# Patient Record
Sex: Male | Born: 1935 | Race: White | Hispanic: No | Marital: Married | Smoking: Former smoker
Health system: Southern US, Community
[De-identification: ages and names within clinical notes are randomized; demographics above are authoritative.]

## PROBLEM LIST (undated history)

## (undated) DIAGNOSIS — K219 Gastro-esophageal reflux disease without esophagitis: Secondary | ICD-10-CM

## (undated) DIAGNOSIS — E039 Hypothyroidism, unspecified: Secondary | ICD-10-CM

## (undated) DIAGNOSIS — IMO0002 Reserved for concepts with insufficient information to code with codable children: Secondary | ICD-10-CM

## (undated) DIAGNOSIS — I4891 Unspecified atrial fibrillation: Secondary | ICD-10-CM

## (undated) DIAGNOSIS — I509 Heart failure, unspecified: Secondary | ICD-10-CM

## (undated) DIAGNOSIS — I4892 Unspecified atrial flutter: Secondary | ICD-10-CM

## (undated) DIAGNOSIS — I495 Sick sinus syndrome: Secondary | ICD-10-CM

## (undated) DIAGNOSIS — M109 Gout, unspecified: Secondary | ICD-10-CM

## (undated) DIAGNOSIS — J449 Chronic obstructive pulmonary disease, unspecified: Secondary | ICD-10-CM

## (undated) DIAGNOSIS — I272 Pulmonary hypertension, unspecified: Secondary | ICD-10-CM

## (undated) DIAGNOSIS — H409 Unspecified glaucoma: Secondary | ICD-10-CM

## (undated) DIAGNOSIS — Z950 Presence of cardiac pacemaker: Secondary | ICD-10-CM

## (undated) HISTORY — DX: Gout, unspecified: M10.9

## (undated) HISTORY — PX: COLONOSCOPY W/ POLYPECTOMY: SHX1380

## (undated) HISTORY — DX: Heart failure, unspecified: I50.9

## (undated) HISTORY — DX: Reserved for concepts with insufficient information to code with codable children: IMO0002

## (undated) HISTORY — DX: Sick sinus syndrome: I49.5

## (undated) HISTORY — DX: Hypothyroidism, unspecified: E03.9

## (undated) HISTORY — DX: Gastro-esophageal reflux disease without esophagitis: K21.9

## (undated) HISTORY — DX: Chronic obstructive pulmonary disease, unspecified: J44.9

## (undated) HISTORY — DX: Unspecified atrial flutter: I48.92

## (undated) HISTORY — DX: Pulmonary hypertension, unspecified: I27.20

## (undated) HISTORY — DX: Unspecified glaucoma: H40.9

## (undated) HISTORY — PX: LUNG BIOPSY: SHX232

## (undated) HISTORY — DX: Presence of cardiac pacemaker: Z95.0

## (undated) HISTORY — DX: Unspecified atrial fibrillation: I48.91

---

## 2001-04-03 ENCOUNTER — Ambulatory Visit (HOSPITAL_COMMUNITY): Admission: RE | Admit: 2001-04-03 | Discharge: 2001-04-03 | Payer: Self-pay | Admitting: *Deleted

## 2002-04-07 ENCOUNTER — Encounter: Payer: Self-pay | Admitting: Family Medicine

## 2002-04-07 ENCOUNTER — Ambulatory Visit (HOSPITAL_COMMUNITY): Admission: RE | Admit: 2002-04-07 | Discharge: 2002-04-07 | Payer: Self-pay | Admitting: Family Medicine

## 2002-05-14 ENCOUNTER — Encounter (INDEPENDENT_AMBULATORY_CARE_PROVIDER_SITE_OTHER): Payer: Self-pay | Admitting: Internal Medicine

## 2002-05-14 ENCOUNTER — Ambulatory Visit (HOSPITAL_COMMUNITY): Admission: RE | Admit: 2002-05-14 | Discharge: 2002-05-14 | Payer: Self-pay | Admitting: Internal Medicine

## 2002-06-09 ENCOUNTER — Ambulatory Visit (HOSPITAL_COMMUNITY): Admission: RE | Admit: 2002-06-09 | Discharge: 2002-06-09 | Payer: Self-pay | Admitting: Otolaryngology

## 2002-06-09 ENCOUNTER — Encounter: Payer: Self-pay | Admitting: Otolaryngology

## 2002-09-23 ENCOUNTER — Ambulatory Visit (HOSPITAL_COMMUNITY): Admission: RE | Admit: 2002-09-23 | Discharge: 2002-09-23 | Payer: Self-pay | Admitting: Internal Medicine

## 2002-09-23 ENCOUNTER — Encounter (INDEPENDENT_AMBULATORY_CARE_PROVIDER_SITE_OTHER): Payer: Self-pay | Admitting: Internal Medicine

## 2002-10-04 ENCOUNTER — Ambulatory Visit (HOSPITAL_COMMUNITY): Admission: RE | Admit: 2002-10-04 | Discharge: 2002-10-04 | Payer: Self-pay | Admitting: Internal Medicine

## 2002-10-07 ENCOUNTER — Encounter: Payer: Self-pay | Admitting: Internal Medicine

## 2002-10-07 ENCOUNTER — Emergency Department (HOSPITAL_COMMUNITY): Admission: EM | Admit: 2002-10-07 | Discharge: 2002-10-07 | Payer: Self-pay | Admitting: Internal Medicine

## 2003-04-29 ENCOUNTER — Encounter: Payer: Self-pay | Admitting: Family Medicine

## 2003-04-29 ENCOUNTER — Ambulatory Visit (HOSPITAL_COMMUNITY): Admission: RE | Admit: 2003-04-29 | Discharge: 2003-04-29 | Payer: Self-pay | Admitting: Family Medicine

## 2003-05-05 ENCOUNTER — Ambulatory Visit (HOSPITAL_COMMUNITY): Admission: RE | Admit: 2003-05-05 | Discharge: 2003-05-05 | Payer: Self-pay | Admitting: Family Medicine

## 2003-07-19 ENCOUNTER — Ambulatory Visit (HOSPITAL_COMMUNITY): Admission: RE | Admit: 2003-07-19 | Discharge: 2003-07-19 | Payer: Self-pay | Admitting: Thoracic Surgery

## 2003-09-02 ENCOUNTER — Ambulatory Visit (HOSPITAL_COMMUNITY): Admission: RE | Admit: 2003-09-02 | Discharge: 2003-09-02 | Payer: Self-pay | Admitting: Family Medicine

## 2003-09-02 ENCOUNTER — Encounter: Payer: Self-pay | Admitting: Family Medicine

## 2003-09-13 ENCOUNTER — Ambulatory Visit (HOSPITAL_COMMUNITY): Admission: RE | Admit: 2003-09-13 | Discharge: 2003-09-13 | Payer: Self-pay | Admitting: Family Medicine

## 2003-09-13 ENCOUNTER — Encounter: Payer: Self-pay | Admitting: Family Medicine

## 2004-01-25 ENCOUNTER — Ambulatory Visit (HOSPITAL_COMMUNITY): Admission: RE | Admit: 2004-01-25 | Discharge: 2004-01-25 | Payer: Self-pay | Admitting: Pulmonary Disease

## 2004-05-18 ENCOUNTER — Ambulatory Visit (HOSPITAL_COMMUNITY): Admission: RE | Admit: 2004-05-18 | Discharge: 2004-05-18 | Payer: Self-pay | Admitting: Family Medicine

## 2004-10-08 ENCOUNTER — Ambulatory Visit (HOSPITAL_COMMUNITY): Admission: RE | Admit: 2004-10-08 | Discharge: 2004-10-08 | Payer: Self-pay | Admitting: Family Medicine

## 2004-10-22 ENCOUNTER — Ambulatory Visit: Payer: Self-pay | Admitting: Internal Medicine

## 2004-12-03 ENCOUNTER — Ambulatory Visit (HOSPITAL_COMMUNITY): Admission: RE | Admit: 2004-12-03 | Discharge: 2004-12-03 | Payer: Self-pay | Admitting: Family Medicine

## 2005-05-20 ENCOUNTER — Emergency Department (HOSPITAL_COMMUNITY): Admission: EM | Admit: 2005-05-20 | Discharge: 2005-05-20 | Payer: Self-pay | Admitting: Emergency Medicine

## 2005-06-05 ENCOUNTER — Emergency Department (HOSPITAL_COMMUNITY): Admission: EM | Admit: 2005-06-05 | Discharge: 2005-06-05 | Payer: Self-pay | Admitting: Emergency Medicine

## 2005-08-02 ENCOUNTER — Ambulatory Visit: Payer: Self-pay | Admitting: Internal Medicine

## 2005-11-18 ENCOUNTER — Ambulatory Visit (HOSPITAL_COMMUNITY): Admission: RE | Admit: 2005-11-18 | Discharge: 2005-11-18 | Payer: Self-pay | Admitting: Family Medicine

## 2005-11-28 ENCOUNTER — Ambulatory Visit (HOSPITAL_COMMUNITY): Admission: RE | Admit: 2005-11-28 | Discharge: 2005-11-28 | Payer: Self-pay | Admitting: Family Medicine

## 2006-08-28 ENCOUNTER — Encounter: Admission: RE | Admit: 2006-08-28 | Discharge: 2006-08-28 | Payer: Self-pay | Admitting: Rheumatology

## 2006-11-25 ENCOUNTER — Encounter: Admission: RE | Admit: 2006-11-25 | Discharge: 2006-11-25 | Payer: Self-pay | Admitting: Rheumatology

## 2007-01-11 ENCOUNTER — Inpatient Hospital Stay (HOSPITAL_COMMUNITY): Admission: EM | Admit: 2007-01-11 | Discharge: 2007-01-13 | Payer: Self-pay | Admitting: Emergency Medicine

## 2007-03-19 ENCOUNTER — Ambulatory Visit: Payer: Self-pay | Admitting: Internal Medicine

## 2007-05-07 ENCOUNTER — Ambulatory Visit: Payer: Self-pay | Admitting: Internal Medicine

## 2007-06-15 ENCOUNTER — Ambulatory Visit: Payer: Self-pay | Admitting: Internal Medicine

## 2007-07-13 ENCOUNTER — Encounter: Admission: RE | Admit: 2007-07-13 | Discharge: 2007-07-13 | Payer: Self-pay | Admitting: Rheumatology

## 2007-08-08 ENCOUNTER — Emergency Department (HOSPITAL_COMMUNITY): Admission: EM | Admit: 2007-08-08 | Discharge: 2007-08-08 | Payer: Self-pay | Admitting: Emergency Medicine

## 2007-08-12 ENCOUNTER — Encounter: Admission: RE | Admit: 2007-08-12 | Discharge: 2007-08-12 | Payer: Self-pay | Admitting: *Deleted

## 2007-08-27 ENCOUNTER — Ambulatory Visit: Payer: Self-pay | Admitting: Internal Medicine

## 2008-04-11 ENCOUNTER — Ambulatory Visit (HOSPITAL_COMMUNITY): Admission: RE | Admit: 2008-04-11 | Discharge: 2008-04-11 | Payer: Self-pay | Admitting: Family Medicine

## 2008-05-26 ENCOUNTER — Ambulatory Visit: Payer: Self-pay | Admitting: Cardiovascular Disease

## 2008-05-31 ENCOUNTER — Ambulatory Visit (HOSPITAL_COMMUNITY): Admission: RE | Admit: 2008-05-31 | Discharge: 2008-05-31 | Payer: Self-pay | Admitting: Cardiovascular Disease

## 2008-05-31 ENCOUNTER — Ambulatory Visit: Payer: Self-pay | Admitting: Cardiology

## 2008-05-31 ENCOUNTER — Encounter: Payer: Self-pay | Admitting: Cardiovascular Disease

## 2008-06-11 ENCOUNTER — Encounter: Payer: Self-pay | Admitting: Internal Medicine

## 2008-06-11 DIAGNOSIS — M1990 Unspecified osteoarthritis, unspecified site: Secondary | ICD-10-CM | POA: Insufficient documentation

## 2008-06-11 DIAGNOSIS — J449 Chronic obstructive pulmonary disease, unspecified: Secondary | ICD-10-CM

## 2008-06-11 DIAGNOSIS — I517 Cardiomegaly: Secondary | ICD-10-CM

## 2008-06-11 DIAGNOSIS — Z87448 Personal history of other diseases of urinary system: Secondary | ICD-10-CM | POA: Insufficient documentation

## 2008-06-15 ENCOUNTER — Encounter: Payer: Self-pay | Admitting: Internal Medicine

## 2009-02-22 ENCOUNTER — Ambulatory Visit (HOSPITAL_COMMUNITY): Admission: RE | Admit: 2009-02-22 | Discharge: 2009-02-22 | Payer: Self-pay | Admitting: Family Medicine

## 2009-02-28 ENCOUNTER — Ambulatory Visit (HOSPITAL_COMMUNITY): Admission: RE | Admit: 2009-02-28 | Discharge: 2009-02-28 | Payer: Self-pay | Admitting: Family Medicine

## 2009-03-20 ENCOUNTER — Ambulatory Visit: Payer: Self-pay | Admitting: Internal Medicine

## 2009-06-14 ENCOUNTER — Encounter: Payer: Self-pay | Admitting: Internal Medicine

## 2009-06-30 ENCOUNTER — Ambulatory Visit (HOSPITAL_COMMUNITY): Admission: RE | Admit: 2009-06-30 | Discharge: 2009-06-30 | Payer: Self-pay | Admitting: Family Medicine

## 2009-07-06 ENCOUNTER — Ambulatory Visit (HOSPITAL_COMMUNITY): Admission: RE | Admit: 2009-07-06 | Discharge: 2009-07-06 | Payer: Self-pay | Admitting: Rheumatology

## 2010-04-29 ENCOUNTER — Inpatient Hospital Stay (HOSPITAL_COMMUNITY): Admission: EM | Admit: 2010-04-29 | Discharge: 2010-05-02 | Payer: Self-pay | Admitting: Emergency Medicine

## 2010-04-30 ENCOUNTER — Encounter (INDEPENDENT_AMBULATORY_CARE_PROVIDER_SITE_OTHER): Payer: Self-pay | Admitting: Internal Medicine

## 2010-05-18 ENCOUNTER — Ambulatory Visit (HOSPITAL_COMMUNITY): Admission: RE | Admit: 2010-05-18 | Discharge: 2010-05-18 | Payer: Self-pay | Admitting: Cardiology

## 2010-05-22 ENCOUNTER — Ambulatory Visit (HOSPITAL_COMMUNITY): Admission: RE | Admit: 2010-05-22 | Discharge: 2010-05-22 | Payer: Self-pay | Admitting: Cardiology

## 2010-06-03 ENCOUNTER — Inpatient Hospital Stay (HOSPITAL_COMMUNITY): Admission: EM | Admit: 2010-06-03 | Discharge: 2010-06-07 | Payer: Self-pay | Admitting: Emergency Medicine

## 2010-06-04 ENCOUNTER — Ambulatory Visit: Payer: Self-pay | Admitting: Gastroenterology

## 2010-06-05 ENCOUNTER — Encounter (INDEPENDENT_AMBULATORY_CARE_PROVIDER_SITE_OTHER): Payer: Self-pay

## 2010-06-05 ENCOUNTER — Ambulatory Visit: Payer: Self-pay | Admitting: Gastroenterology

## 2010-06-06 ENCOUNTER — Ambulatory Visit: Payer: Self-pay | Admitting: Gastroenterology

## 2010-06-19 ENCOUNTER — Ambulatory Visit (HOSPITAL_COMMUNITY): Admission: RE | Admit: 2010-06-19 | Discharge: 2010-06-19 | Payer: Self-pay | Admitting: Cardiology

## 2010-06-26 ENCOUNTER — Encounter (INDEPENDENT_AMBULATORY_CARE_PROVIDER_SITE_OTHER): Payer: Self-pay | Admitting: Cardiology

## 2010-06-26 ENCOUNTER — Inpatient Hospital Stay (HOSPITAL_COMMUNITY): Admission: RE | Admit: 2010-06-26 | Discharge: 2010-06-27 | Payer: Self-pay | Admitting: Cardiology

## 2010-08-02 ENCOUNTER — Encounter: Payer: Self-pay | Admitting: Gastroenterology

## 2010-09-20 ENCOUNTER — Ambulatory Visit (HOSPITAL_COMMUNITY): Admission: RE | Admit: 2010-09-20 | Discharge: 2010-09-21 | Payer: Self-pay | Admitting: Cardiology

## 2010-09-20 DIAGNOSIS — Z950 Presence of cardiac pacemaker: Secondary | ICD-10-CM

## 2010-09-20 HISTORY — PX: INSERT / REPLACE / REMOVE PACEMAKER: SUR710

## 2010-09-20 HISTORY — DX: Presence of cardiac pacemaker: Z95.0

## 2010-09-23 IMAGING — CR DG CHEST 1V PORT
1 series · 1 of 1 positions shown · non-contrast
Comparison: Portable exam 0171 hours compared to 05/18/2010

CLINICAL DATA: Lower GI bleed

PORTABLE CHEST - 1 VIEW

[view not recorded]
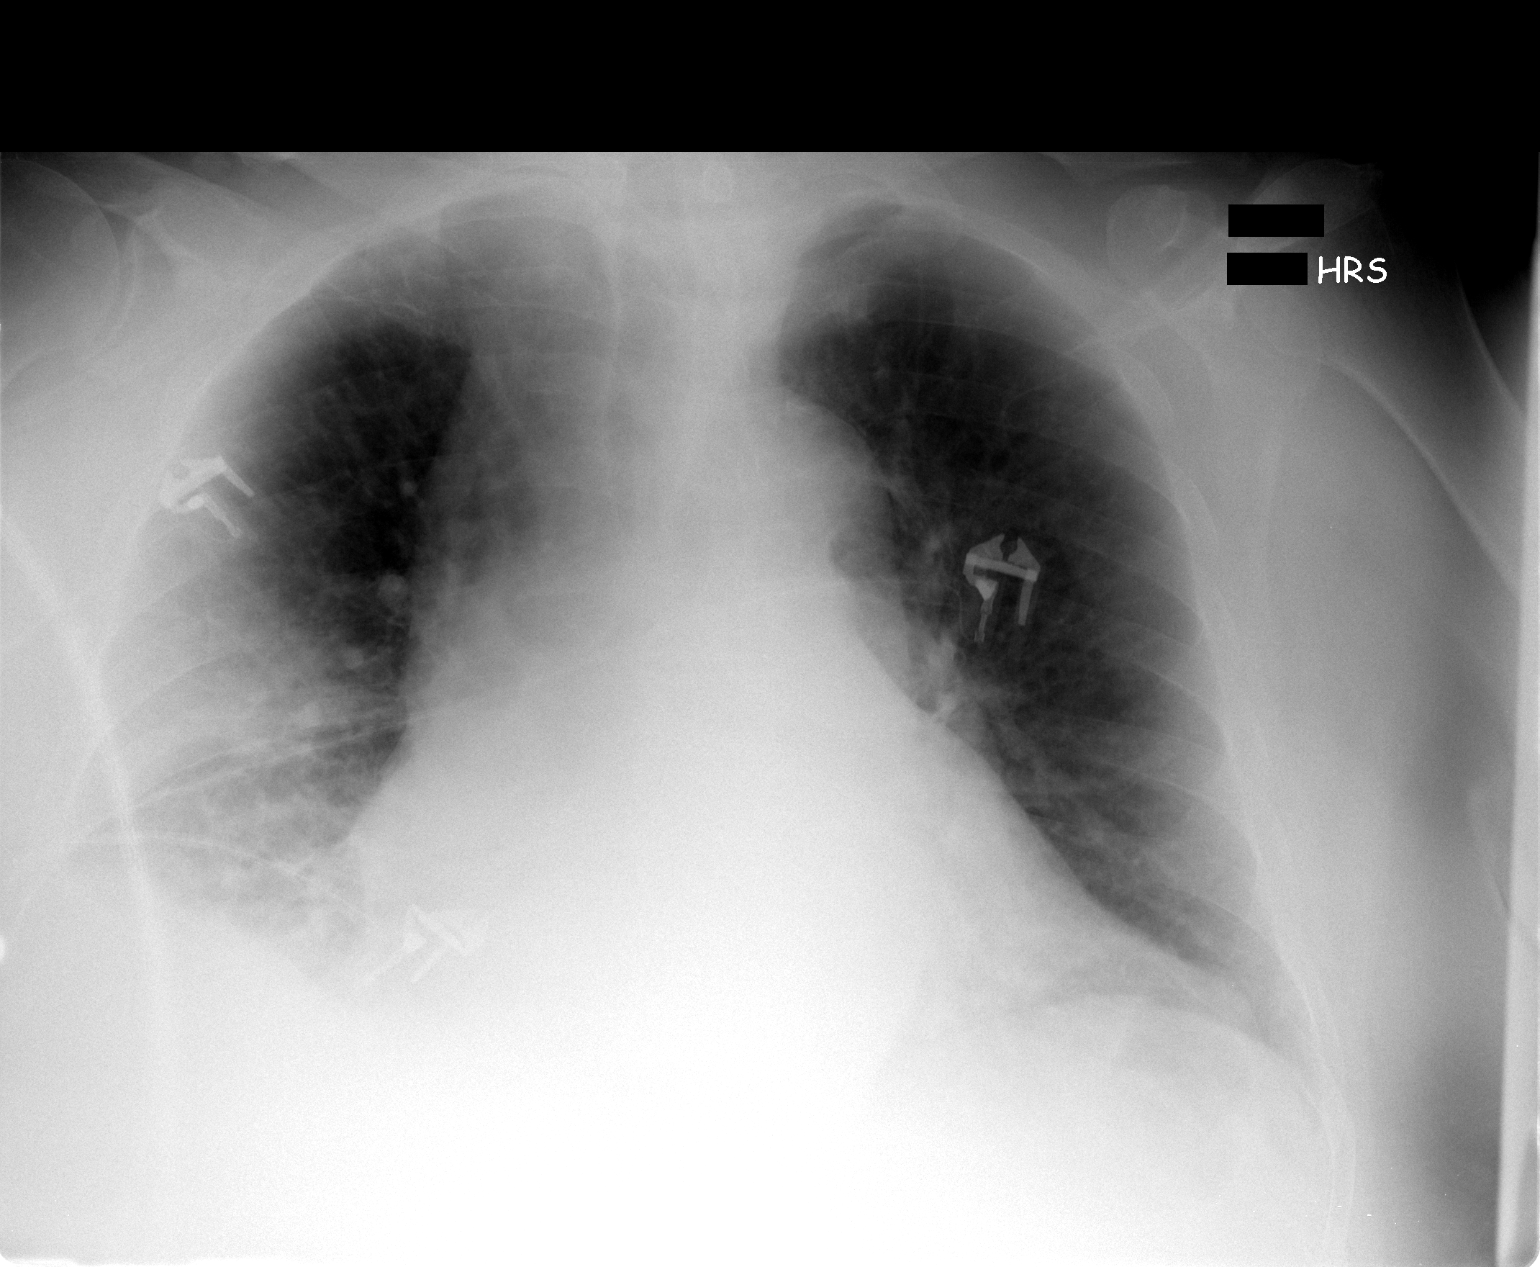

[1 of 1 positions shown; findings below may reference images not displayed]

FINDINGS: Cardiac enlargement.
Tortuous thoracic aorta.
Pulmonary vascularity normal.
Infiltrate identified in right lower lobe and questionably in right
middle lobe, with poor definition of a portion of the right heart
border.
Left lung clear.
Lordotic positioning.
No gross effusion or pneumothorax.
IMPRESSION: Right lower lobe and question mild right middle lobe infiltrates.
Cardiomegaly.

## 2010-09-24 ENCOUNTER — Ambulatory Visit (HOSPITAL_COMMUNITY): Admission: RE | Admit: 2010-09-24 | Discharge: 2010-09-24 | Payer: Self-pay | Admitting: Cardiology

## 2010-09-27 ENCOUNTER — Ambulatory Visit (HOSPITAL_COMMUNITY): Admission: RE | Admit: 2010-09-27 | Discharge: 2010-09-27 | Payer: Self-pay | Admitting: Cardiology

## 2010-10-18 ENCOUNTER — Inpatient Hospital Stay (HOSPITAL_COMMUNITY): Admission: EM | Admit: 2010-10-18 | Discharge: 2010-10-19 | Payer: Self-pay | Admitting: Emergency Medicine

## 2010-10-22 ENCOUNTER — Ambulatory Visit (HOSPITAL_COMMUNITY): Admission: RE | Admit: 2010-10-22 | Discharge: 2010-10-22 | Payer: Self-pay | Admitting: Cardiology

## 2011-01-01 ENCOUNTER — Ambulatory Visit
Admission: RE | Admit: 2011-01-01 | Discharge: 2011-01-01 | Payer: Self-pay | Source: Home / Self Care | Attending: Internal Medicine | Admitting: Internal Medicine

## 2011-01-15 NOTE — Letter (Signed)
Summary: CONSULTATION  CONSULTATION   Imported By: Hoy Morn 08/02/2010 15:54:33  _____________________________________________________________________  External Attachment:    Type:   Image     Comment:   External Document

## 2011-02-09 IMAGING — CR DG CHEST 2V
2 series · 2 of 2 positions shown · non-contrast
Comparison: 10/18/2010.

CLINICAL DATA: Pneumothorax follow up.

CHEST - 2 VIEW

[view not recorded (1 of 2)]
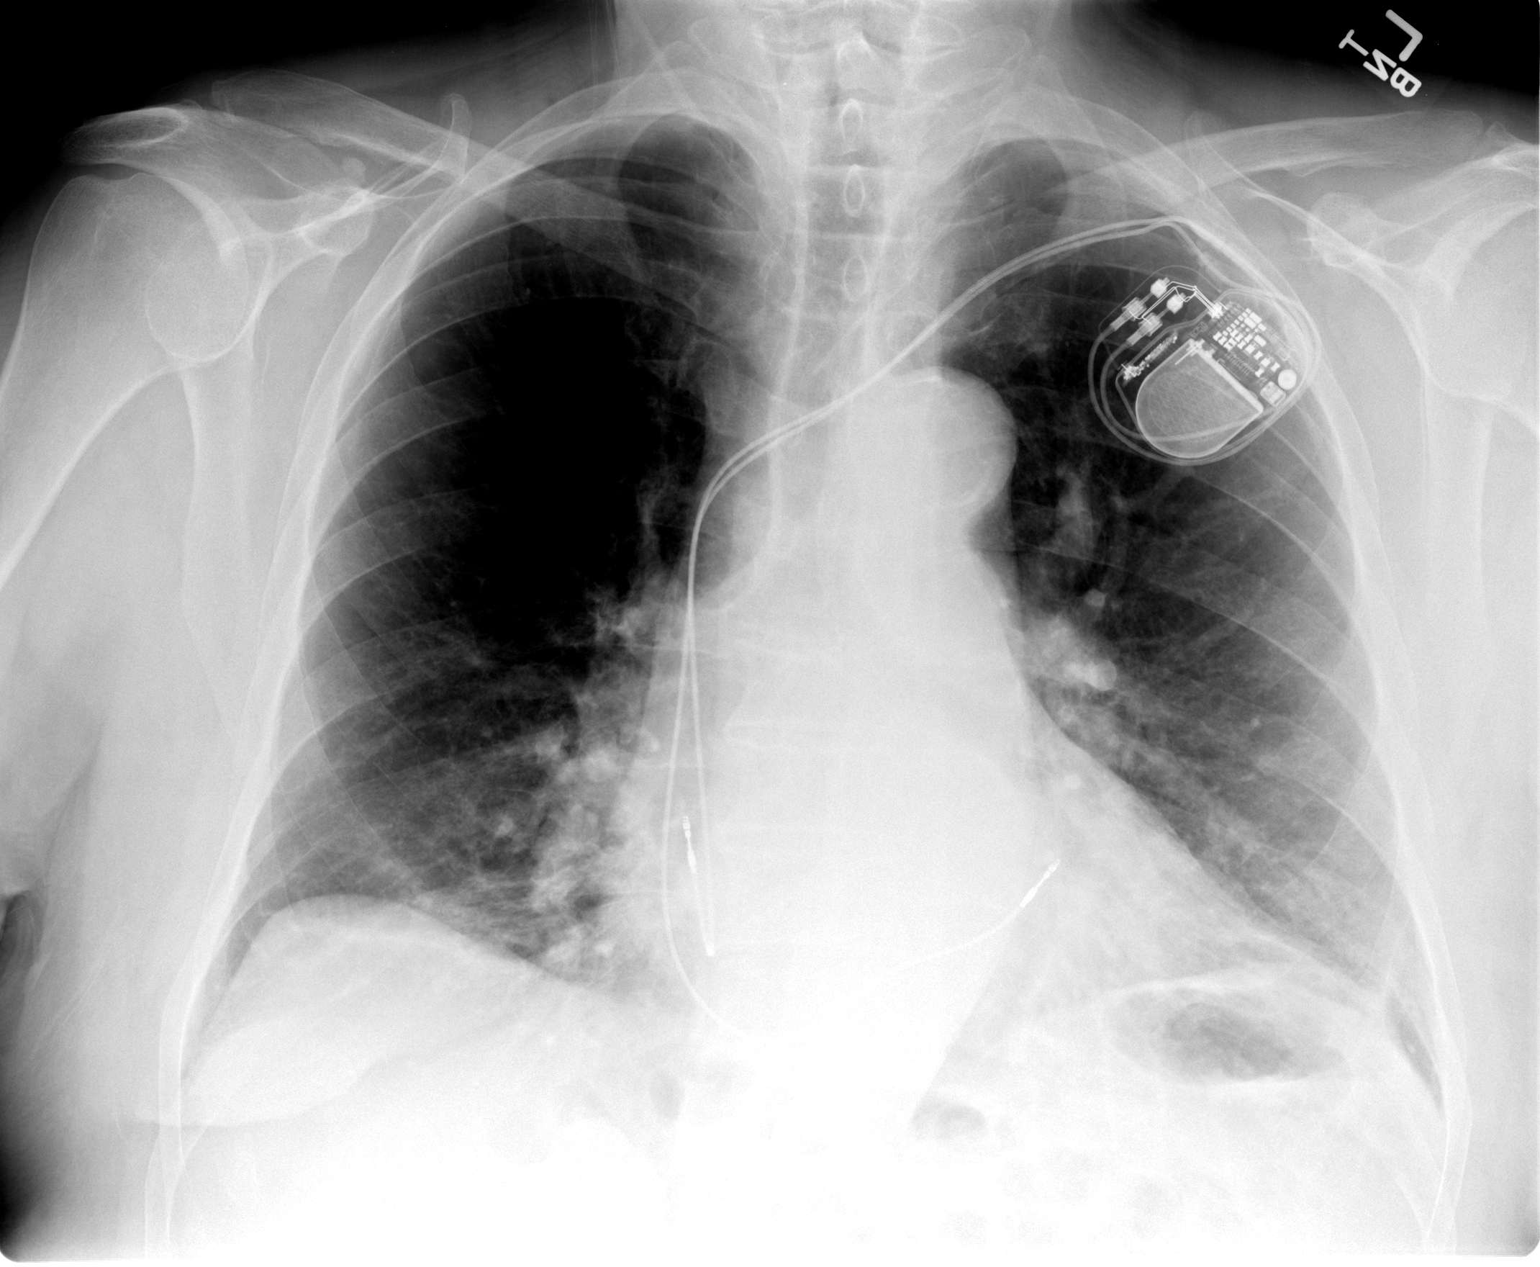

[view not recorded (2 of 2)]
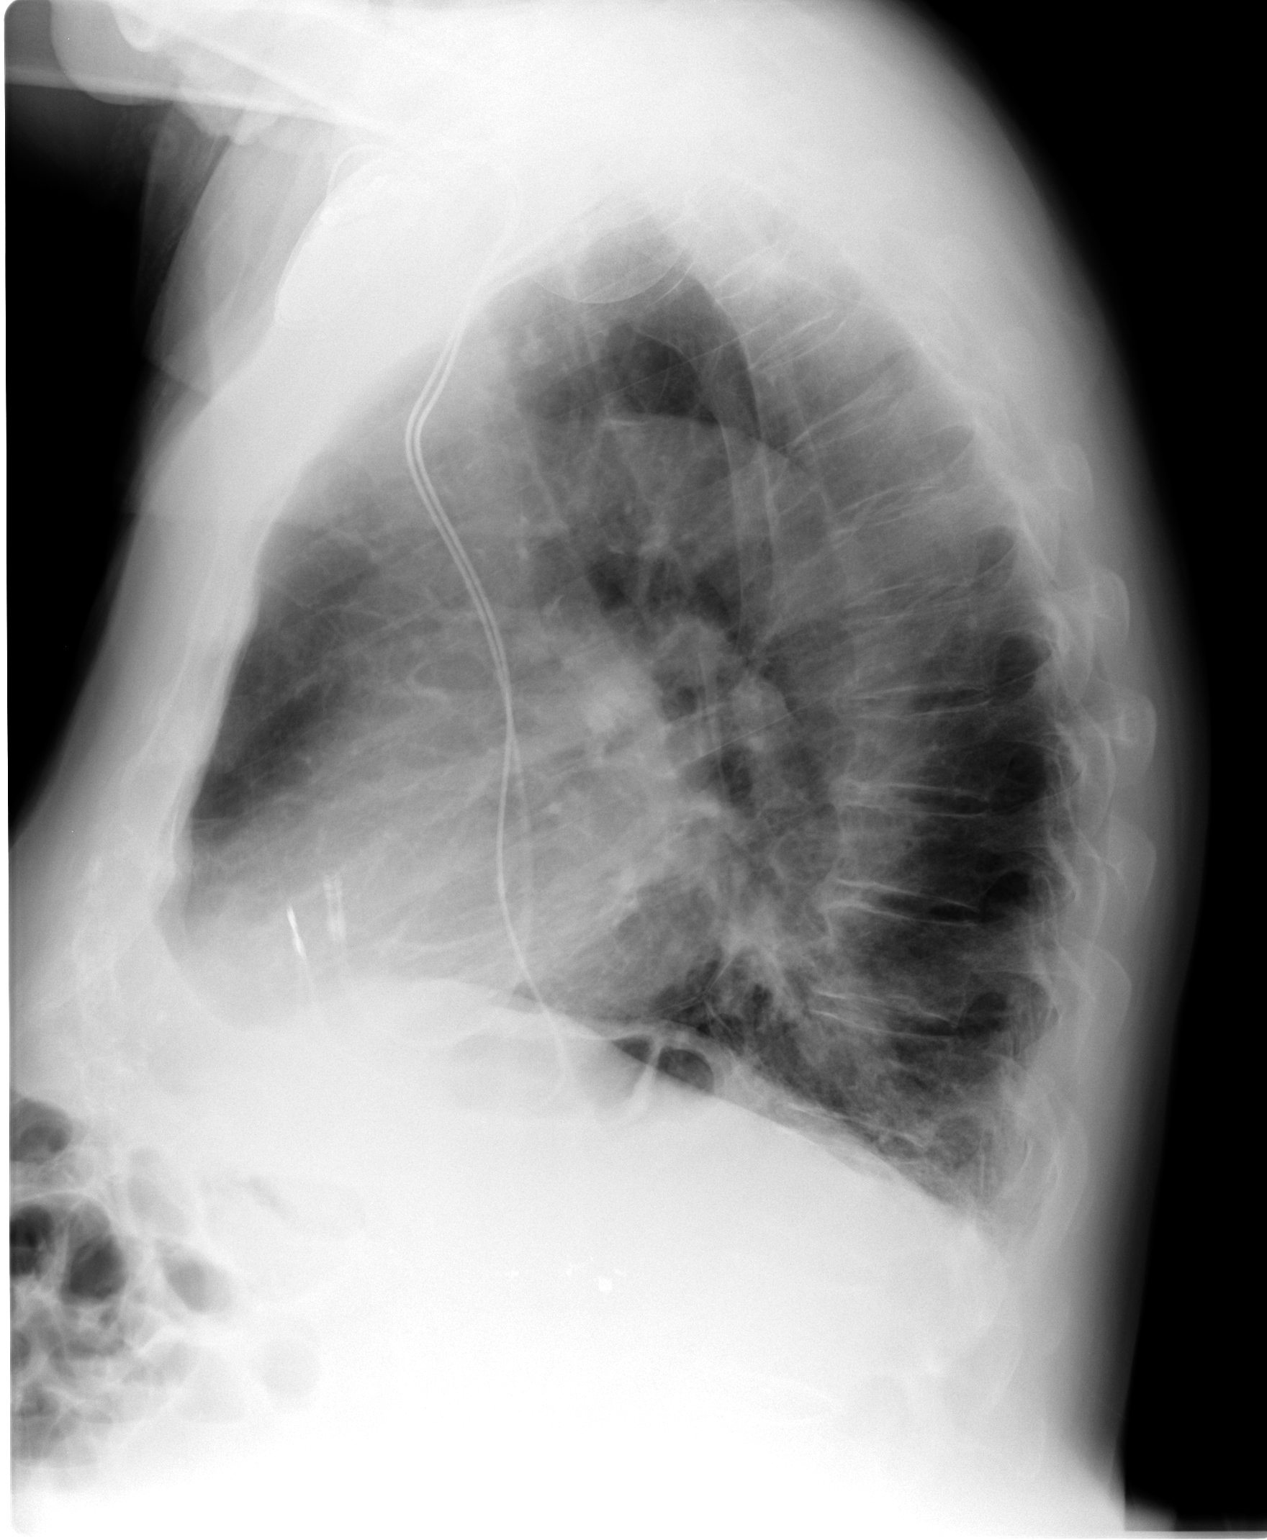

[2 of 2 positions shown; findings below may reference images not displayed]

FINDINGS: Dual lead transvenous pacemaker is in place with
controller device on the left.  Heart is upper normal size.
Nonaneurysmal aortic calcifications are present.  There is chronic
slight elevation of the right hemidiaphragm with minimal
atelectasis and / or fibrosis in the right base.  There is also
linear atelectasis and / or fibrotic change in the left base.  No
pneumothorax is evident.  No pleural effusion is seen.  There is
flattening of diaphragm on lateral image consistent with
hyperinflation.  There is degenerative spondylosis.  There is
osteopenic appearance of bones.
IMPRESSION: No residual pneumothorax is evident.  There is overall
hyperinflation configuration.  Basilar linear atelectasis and / or
fibrosis appears stable.

## 2011-02-26 LAB — DIFFERENTIAL
Basophils Absolute: 0 10*3/uL (ref 0.0–0.1)
Basophils Relative: 0 % (ref 0–1)
Basophils Relative: 0 % (ref 0–1)
Eosinophils Absolute: 0.2 10*3/uL (ref 0.0–0.7)
Eosinophils Relative: 0 % (ref 0–5)
Eosinophils Relative: 2 % (ref 0–5)
Lymphocytes Relative: 19 % (ref 12–46)
Monocytes Absolute: 0.8 10*3/uL (ref 0.1–1.0)
Monocytes Relative: 5 % (ref 3–12)
Neutro Abs: 7.6 10*3/uL (ref 1.7–7.7)
Neutrophils Relative %: 74 % (ref 43–77)

## 2011-02-26 LAB — URINALYSIS, ROUTINE W REFLEX MICROSCOPIC
Glucose, UA: NEGATIVE mg/dL
Nitrite: NEGATIVE
Specific Gravity, Urine: 1.015 (ref 1.005–1.030)
pH: 6 (ref 5.0–8.0)

## 2011-02-26 LAB — CBC
HCT: 35.8 % — ABNORMAL LOW (ref 39.0–52.0)
Hemoglobin: 12.1 g/dL — ABNORMAL LOW (ref 13.0–17.0)
MCV: 80.3 fL (ref 78.0–100.0)
Platelets: 226 10*3/uL (ref 150–400)
RBC: 4.11 MIL/uL — ABNORMAL LOW (ref 4.22–5.81)
RBC: 4.43 MIL/uL (ref 4.22–5.81)
WBC: 10.3 10*3/uL (ref 4.0–10.5)
WBC: 7.8 10*3/uL (ref 4.0–10.5)

## 2011-02-26 LAB — TROPONIN I: Troponin I: 0.03 ng/mL (ref 0.00–0.06)

## 2011-02-26 LAB — BASIC METABOLIC PANEL
Calcium: 8.7 mg/dL (ref 8.4–10.5)
Creatinine, Ser: 1.17 mg/dL (ref 0.4–1.5)
GFR calc Af Amer: >60 mL/min (ref >60)
GFR calc non Af Amer: 59 mL/min — ABNORMAL LOW (ref >60)

## 2011-02-26 LAB — T4, FREE: Free T4: 1.23 ng/dL (ref 0.80–1.80)

## 2011-02-26 LAB — PROTIME-INR
INR: 2.19 — ABNORMAL HIGH (ref 0.00–1.49)
Prothrombin Time: 24.5 seconds — ABNORMAL HIGH (ref 11.6–15.2)

## 2011-02-26 LAB — COMPREHENSIVE METABOLIC PANEL
ALT: 15 U/L (ref 0–53)
AST: 15 U/L (ref 0–37)
Albumin: 3.1 g/dL — ABNORMAL LOW (ref 3.5–5.2)
Alkaline Phosphatase: 39 U/L (ref 39–117)
Chloride: 105 mEq/L (ref 96–112)
GFR calc Af Amer: >60 mL/min (ref >60)
Potassium: 4.2 mEq/L (ref 3.5–5.1)
Sodium: 135 mEq/L (ref 135–145)
Total Bilirubin: 0.5 mg/dL (ref 0.3–1.2)

## 2011-02-26 LAB — URINE CULTURE: Colony Count: 50000

## 2011-02-26 LAB — CORTISOL: Cortisol, Plasma: 15.5 ug/dL

## 2011-02-26 LAB — CARDIAC PANEL(CRET KIN+CKTOT+MB+TROPI)
Relative Index: INVALID (ref 0.0–2.5)
Relative Index: INVALID (ref 0.0–2.5)

## 2011-02-26 LAB — POCT CARDIAC MARKERS

## 2011-02-26 LAB — TSH: TSH: 1.274 u[IU]/mL (ref 0.350–4.500)

## 2011-02-28 LAB — SURGICAL PCR SCREEN
MRSA, PCR: NEGATIVE
Staphylococcus aureus: NEGATIVE

## 2011-02-28 LAB — PROTIME-INR
INR: 1.08 (ref 0.00–1.49)
INR: 1.17 (ref 0.00–1.49)

## 2011-02-28 LAB — HEMOGLOBIN AND HEMATOCRIT, BLOOD: Hemoglobin: 12.9 g/dL — ABNORMAL LOW (ref 13.0–17.0)

## 2011-03-03 LAB — BASIC METABOLIC PANEL
BUN: 6 mg/dL (ref 6–23)
CO2: 32 mEq/L (ref 19–32)
Calcium: 7.6 mg/dL — ABNORMAL LOW (ref 8.4–10.5)
Calcium: 7.8 mg/dL — ABNORMAL LOW (ref 8.4–10.5)
Creatinine, Ser: 0.83 mg/dL (ref 0.4–1.5)
GFR calc Af Amer: >60 mL/min (ref >60)
GFR calc Af Amer: >60 mL/min (ref >60)
GFR calc non Af Amer: 59 mL/min — ABNORMAL LOW (ref >60)
GFR calc non Af Amer: >60 mL/min (ref >60)
GFR calc non Af Amer: >60 mL/min (ref >60)
Glucose, Bld: 95 mg/dL (ref 70–99)
Glucose, Bld: 91 mg/dL (ref 70–99)
Potassium: 4.4 mEq/L (ref 3.5–5.1)
Sodium: 139 mEq/L (ref 135–145)
Sodium: 138 mEq/L (ref 135–145)

## 2011-03-03 LAB — CARDIAC PANEL(CRET KIN+CKTOT+MB+TROPI)
CK, MB: 2.7 ng/mL (ref 0.3–4.0)
CK, MB: 4.2 ng/mL — ABNORMAL HIGH (ref 0.3–4.0)
Total CK: 100 U/L (ref 7–232)
Total CK: 102 U/L (ref 7–232)
Total CK: 79 U/L (ref 7–232)
Troponin I: 0.01 ng/mL (ref 0.00–0.06)

## 2011-03-03 LAB — COMPREHENSIVE METABOLIC PANEL
ALT: 15 U/L (ref 0–53)
ALT: 15 U/L (ref 0–53)
AST: 15 U/L (ref 0–37)
AST: 19 U/L (ref 0–37)
Albumin: 3.2 g/dL — ABNORMAL LOW (ref 3.5–5.2)
Alkaline Phosphatase: 29 U/L — ABNORMAL LOW (ref 39–117)
Calcium: 7.4 mg/dL — ABNORMAL LOW (ref 8.4–10.5)
Chloride: 101 mEq/L (ref 96–112)
Creatinine, Ser: 0.92 mg/dL (ref 0.4–1.5)
GFR calc Af Amer: >60 mL/min (ref >60)
GFR calc Af Amer: >60 mL/min (ref >60)
Glucose, Bld: 88 mg/dL (ref 70–99)
Potassium: 3.3 mEq/L — ABNORMAL LOW (ref 3.5–5.1)
Sodium: 134 mEq/L — ABNORMAL LOW (ref 135–145)
Sodium: 139 mEq/L (ref 135–145)
Total Bilirubin: 0.6 mg/dL (ref 0.3–1.2)
Total Protein: 4.9 g/dL — ABNORMAL LOW (ref 6.0–8.3)

## 2011-03-03 LAB — DIFFERENTIAL
Basophils Absolute: 0 10*3/uL (ref 0.0–0.1)
Basophils Absolute: 0 10*3/uL (ref 0.0–0.1)
Basophils Absolute: 0 10*3/uL (ref 0.0–0.1)
Basophils Absolute: 0 10*3/uL (ref 0.0–0.1)
Basophils Absolute: 0.1 10*3/uL (ref 0.0–0.1)
Basophils Relative: 0 % (ref 0–1)
Basophils Relative: 0 % (ref 0–1)
Basophils Relative: 0 % (ref 0–1)
Basophils Relative: 0 % (ref 0–1)
Basophils Relative: 0 % (ref 0–1)
Eosinophils Absolute: 0.1 10*3/uL (ref 0.0–0.7)
Eosinophils Absolute: 0.1 10*3/uL (ref 0.0–0.7)
Eosinophils Absolute: 0.2 10*3/uL (ref 0.0–0.7)
Eosinophils Absolute: 0.2 10*3/uL (ref 0.0–0.7)
Eosinophils Relative: 1 % (ref 0–5)
Eosinophils Relative: 2 % (ref 0–5)
Eosinophils Relative: 2 % (ref 0–5)
Lymphocytes Relative: 17 % (ref 12–46)
Lymphocytes Relative: 27 % (ref 12–46)
Lymphocytes Relative: 27 % (ref 12–46)
Lymphocytes Relative: 30 % (ref 12–46)
Lymphs Abs: 2.5 10*3/uL (ref 0.7–4.0)
Lymphs Abs: 2.8 10*3/uL (ref 0.7–4.0)
Monocytes Absolute: 1 10*3/uL (ref 0.1–1.0)
Monocytes Absolute: 1.2 10*3/uL — ABNORMAL HIGH (ref 0.1–1.0)
Monocytes Absolute: 1.3 10*3/uL — ABNORMAL HIGH (ref 0.1–1.0)
Monocytes Absolute: 1.3 10*3/uL — ABNORMAL HIGH (ref 0.1–1.0)
Monocytes Absolute: 1.7 10*3/uL — ABNORMAL HIGH (ref 0.1–1.0)
Monocytes Relative: 11 % (ref 3–12)
Monocytes Relative: 12 % (ref 3–12)
Monocytes Relative: 13 % — ABNORMAL HIGH (ref 3–12)
Monocytes Relative: 16 % — ABNORMAL HIGH (ref 3–12)
Neutro Abs: 10.2 10*3/uL — ABNORMAL HIGH (ref 1.7–7.7)
Neutro Abs: 4.8 10*3/uL (ref 1.7–7.7)
Neutro Abs: 5.9 10*3/uL (ref 1.7–7.7)
Neutrophils Relative %: 54 % (ref 43–77)
Neutrophils Relative %: 59 % (ref 43–77)
Neutrophils Relative %: 61 % (ref 43–77)

## 2011-03-03 LAB — PREPARE FRESH FROZEN PLASMA

## 2011-03-03 LAB — CBC
HCT: 27.7 % — ABNORMAL LOW (ref 39.0–52.0)
HCT: 29.1 % — ABNORMAL LOW (ref 39.0–52.0)
HCT: 29.9 % — ABNORMAL LOW (ref 39.0–52.0)
Hemoglobin: 10 g/dL — ABNORMAL LOW (ref 13.0–17.0)
Hemoglobin: 8.3 g/dL — ABNORMAL LOW (ref 13.0–17.0)
Hemoglobin: 9.3 g/dL — ABNORMAL LOW (ref 13.0–17.0)
Hemoglobin: 9.4 g/dL — ABNORMAL LOW (ref 13.0–17.0)
MCHC: 33.6 g/dL (ref 30.0–36.0)
MCHC: 33.6 g/dL (ref 30.0–36.0)
MCHC: 33.6 g/dL (ref 30.0–36.0)
MCHC: 34 g/dL (ref 30.0–36.0)
MCV: 86.7 fL (ref 78.0–100.0)
MCV: 87.1 fL (ref 78.0–100.0)
MCV: 87.4 fL (ref 78.0–100.0)
Platelets: 276 10*3/uL (ref 150–400)
Platelets: 240 10*3/uL (ref 150–400)
Platelets: 261 10*3/uL (ref 150–400)
Platelets: 276 10*3/uL (ref 150–400)
Platelets: 284 10*3/uL (ref 150–400)
Platelets: 384 10*3/uL (ref 150–400)
RBC: 3.34 MIL/uL — ABNORMAL LOW (ref 4.22–5.81)
RBC: 2.86 MIL/uL — ABNORMAL LOW (ref 4.22–5.81)
RBC: 3 MIL/uL — ABNORMAL LOW (ref 4.22–5.81)
RBC: 3.18 MIL/uL — ABNORMAL LOW (ref 4.22–5.81)
RBC: 3.36 MIL/uL — ABNORMAL LOW (ref 4.22–5.81)
RDW: 15.9 % — ABNORMAL HIGH (ref 11.5–15.5)
RDW: 15.3 % (ref 11.5–15.5)
RDW: 15.3 % (ref 11.5–15.5)
RDW: 15.7 % — ABNORMAL HIGH (ref 11.5–15.5)
RDW: 15.8 % — ABNORMAL HIGH (ref 11.5–15.5)
WBC: 6.7 10*3/uL (ref 4.0–10.5)
WBC: 10 10*3/uL (ref 4.0–10.5)
WBC: 10.2 10*3/uL (ref 4.0–10.5)
WBC: 10.9 10*3/uL — ABNORMAL HIGH (ref 4.0–10.5)
WBC: 9.7 10*3/uL (ref 4.0–10.5)

## 2011-03-03 LAB — LIPID PANEL
Cholesterol: 107 mg/dL (ref 0–200)
LDL Cholesterol: 55 mg/dL (ref 0–99)
Total CHOL/HDL Ratio: 3.5 RATIO

## 2011-03-03 LAB — PROTIME-INR
INR: 3.24 — ABNORMAL HIGH (ref 0.00–1.49)
INR: 1.19 (ref 0.00–1.49)
INR: 1.25 (ref 0.00–1.49)
Prothrombin Time: 33.4 seconds — ABNORMAL HIGH (ref 11.6–15.2)
Prothrombin Time: 16.8 seconds — ABNORMAL HIGH (ref 11.6–15.2)

## 2011-03-03 LAB — ABO/RH: ABO/RH(D): A POS

## 2011-03-03 LAB — TYPE AND SCREEN: Antibody Screen: NEGATIVE

## 2011-03-03 LAB — MAGNESIUM
Magnesium: 1.7 mg/dL (ref 1.5–2.5)
Magnesium: 1.7 mg/dL (ref 1.5–2.5)
Magnesium: 1.9 mg/dL (ref 1.5–2.5)

## 2011-03-03 LAB — BRAIN NATRIURETIC PEPTIDE
Pro B Natriuretic peptide (BNP): 183 pg/mL — ABNORMAL HIGH (ref 0.0–100.0)
Pro B Natriuretic peptide (BNP): 68.4 pg/mL (ref 0.0–100.0)

## 2011-03-03 LAB — TSH: TSH: 3.212 u[IU]/mL (ref 0.350–4.500)

## 2011-03-04 LAB — BASIC METABOLIC PANEL
BUN: 10 mg/dL (ref 6–23)
BUN: 14 mg/dL (ref 6–23)
CO2: 28 mEq/L (ref 19–32)
CO2: 28 mEq/L (ref 19–32)
Calcium: 8.4 mg/dL (ref 8.4–10.5)
Chloride: 101 mEq/L (ref 96–112)
Chloride: 101 mEq/L (ref 96–112)
Chloride: 104 mEq/L (ref 96–112)
Chloride: 104 mEq/L (ref 96–112)
Creatinine, Ser: 1 mg/dL (ref 0.4–1.5)
Creatinine, Ser: 1.11 mg/dL (ref 0.4–1.5)
GFR calc Af Amer: >60 mL/min (ref >60)
GFR calc Af Amer: >60 mL/min (ref >60)
GFR calc Af Amer: >60 mL/min (ref >60)
GFR calc non Af Amer: >60 mL/min (ref >60)
GFR calc non Af Amer: >60 mL/min (ref >60)
Glucose, Bld: 102 mg/dL — ABNORMAL HIGH (ref 70–99)
Potassium: 3.4 mEq/L — ABNORMAL LOW (ref 3.5–5.1)
Potassium: 3.4 mEq/L — ABNORMAL LOW (ref 3.5–5.1)
Potassium: 3.8 mEq/L (ref 3.5–5.1)
Potassium: 4.3 mEq/L (ref 3.5–5.1)
Sodium: 136 mEq/L (ref 135–145)
Sodium: 137 mEq/L (ref 135–145)
Sodium: 139 mEq/L (ref 135–145)

## 2011-03-04 LAB — CBC
HCT: 32.6 % — ABNORMAL LOW (ref 39.0–52.0)
HCT: 33.7 % — ABNORMAL LOW (ref 39.0–52.0)
Hemoglobin: 11.4 g/dL — ABNORMAL LOW (ref 13.0–17.0)
Hemoglobin: 11.5 g/dL — ABNORMAL LOW (ref 13.0–17.0)
Hemoglobin: 11.7 g/dL — ABNORMAL LOW (ref 13.0–17.0)
MCHC: 34.9 g/dL (ref 30.0–36.0)
MCV: 86.3 fL (ref 78.0–100.0)
MCV: 86.8 fL (ref 78.0–100.0)
MCV: 86.9 fL (ref 78.0–100.0)
Platelets: 220 10*3/uL (ref 150–400)
Platelets: 278 10*3/uL (ref 150–400)
RBC: 3.78 MIL/uL — ABNORMAL LOW (ref 4.22–5.81)
RBC: 3.88 MIL/uL — ABNORMAL LOW (ref 4.22–5.81)
RDW: 14.1 % (ref 11.5–15.5)
RDW: 14.4 % (ref 11.5–15.5)
WBC: 11.8 10*3/uL — ABNORMAL HIGH (ref 4.0–10.5)
WBC: 7.1 10*3/uL (ref 4.0–10.5)
WBC: 8.4 10*3/uL (ref 4.0–10.5)

## 2011-03-04 LAB — DIFFERENTIAL
Basophils Absolute: 0 10*3/uL (ref 0.0–0.1)
Basophils Absolute: 0 10*3/uL (ref 0.0–0.1)
Basophils Relative: 0 % (ref 0–1)
Eosinophils Absolute: 0.1 10*3/uL (ref 0.0–0.7)
Eosinophils Absolute: 0.1 10*3/uL (ref 0.0–0.7)
Eosinophils Absolute: 0.2 10*3/uL (ref 0.0–0.7)
Eosinophils Relative: 1 % (ref 0–5)
Lymphocytes Relative: 30 % (ref 12–46)
Lymphs Abs: 0.7 10*3/uL (ref 0.7–4.0)
Lymphs Abs: 1.5 10*3/uL (ref 0.7–4.0)
Lymphs Abs: 2.2 10*3/uL (ref 0.7–4.0)
Lymphs Abs: 2.5 10*3/uL (ref 0.7–4.0)
Monocytes Absolute: 1 10*3/uL (ref 0.1–1.0)
Monocytes Absolute: 1.2 10*3/uL — ABNORMAL HIGH (ref 0.1–1.0)
Monocytes Relative: 14 % — ABNORMAL HIGH (ref 3–12)
Monocytes Relative: 15 % — ABNORMAL HIGH (ref 3–12)
Neutro Abs: 4.2 10*3/uL (ref 1.7–7.7)
Neutrophils Relative %: 53 % (ref 43–77)
Neutrophils Relative %: 87 % — ABNORMAL HIGH (ref 43–77)

## 2011-03-04 LAB — TSH: TSH: 0.654 u[IU]/mL (ref 0.350–4.500)

## 2011-03-04 LAB — POCT CARDIAC MARKERS: Myoglobin, poc: 150 ng/mL (ref 12–200)

## 2011-03-04 LAB — BRAIN NATRIURETIC PEPTIDE
Pro B Natriuretic peptide (BNP): 249 pg/mL — ABNORMAL HIGH (ref 0.0–100.0)
Pro B Natriuretic peptide (BNP): 349 pg/mL — ABNORMAL HIGH (ref 0.0–100.0)

## 2011-03-04 LAB — PROTIME-INR: Prothrombin Time: 14.6 seconds (ref 11.6–15.2)

## 2011-03-04 LAB — LACTIC ACID, PLASMA: Lactic Acid, Venous: 3.3 mmol/L — ABNORMAL HIGH (ref 0.5–2.2)

## 2011-03-04 LAB — CARDIAC PANEL(CRET KIN+CKTOT+MB+TROPI): Troponin I: 0.03 ng/mL (ref 0.00–0.06)

## 2011-04-30 NOTE — Assessment & Plan Note (Signed)
[place]                             PULMONARY OFFICE NOTE   NAME:Slyter, TINY RIETZ                        MRN:          [id number]  DATE:05/07/2007                            DOB:          1920/03/24    PULMONARY/EXTENDED SUMMARY FOLLOW UP OFFICE VISIT:   HISTORY:  An 77 year old white male remote smoker who returns for PFT's,  stating that he did not benefit from Symbicort and took himself off of  it after restarting prednisone at 5 mg a day for arthritis.  He  continues to complain of dyspnea walking from his room to the dining  room  (which he says is about 1000 feet).  He typically stops about half  way to catch his breath and then goes on the rest of the way.  The cough  that was previously bothering him was eliminated after he initiated  Nexium.   He reports that the cough was eliminated after using Nexium, but note  that during the interview and exam, he constantly cleared his throat.  He denies any nocturnal complaints of orthopnea, PND, chest tightness,  wheezing or cough, but does have nasal congestion frequently at night  that he attributes to dust mite allergy.   MEDICATIONS:  For full list of medications, please see face sheet dated  May 07, 2007, noting that he was placed on Fosamax but stopped it after  he read that it might hurt his esophagus.   PHYSICAL EXAMINATION:  GENERAL:  He is an obese, ambulatory white male  with a bit of a peculiar affect and attitude who clears his throat  frequently during the interview and exam, but is not aware of it.  He  also has marked hoarseness on phonation that he said your machine  caused.  HEENT:  Unremarkable.  Oropharynx is clear.  NECK:  Supple without cervical adenopathy or tenderness.  Trachea is  midline without thyromegaly.  LUNGS:  Lung fields perfectly clear bilaterally to auscultation and  percussion.  HEART:  Regular rate and rhythm without murmur, gallop, or rub.  No  increase  in P2.  ABDOMEN:  Soft, obese, benign.  EXTREMITIES:  Warm without calf tenderness.  No cyanosis, clubbing or  edema.   A spirometry was reviewed from today and reveals an FEV1 of 106%  predicted with a ratio of 57%.  There is significant truncation on  inspiratory loop.  The diffusing capacity corrects to 67%.   Chest x-ray showed mild COPD, minimum increased markings in the bases.   IMPRESSION:  1. This patient does have chronic obstructive pulmonary disease that      is moderate in severity associated with obesity, but did not      benefit from Symbicort.  If he has not tried Spiriva, we could      certainly consider it on his next visit, but before we do this we      need to address problem #2.  2. This patient continues to show evidence of significant upper      airways dysfunction with frequent throat clearing and truncation on  the inspiratory loop of the flow volume curve.  For that reason, I      recommended he continue Nexium perfectly regularly, and, in fact,      increase it to twice daily (samples given) along with Zantac at      bedtime.  If this does not improve the upper airway problem, the      options would be to empirically add Reglan or have him seen by an      ENT physician.   For now, I would like him to continue off of Fosamax since it might  contribute to reflux, and also off of all oil-based supplements, since  oil reflux and acid reflux are very similar in terms of their side  effects.     Christena Deem. Melvyn Novas, MD, [facility]  Electronically Signed    MBW/MedQ  DD: 05/07/2007  DT: 05/07/2007  Job #: [id number]   cc:   Bonne Dolores, M.D.

## 2011-04-30 NOTE — Assessment & Plan Note (Signed)
[place]                             PULMONARY OFFICE NOTE   SHANDY, VI                        MRN:          [id number]  DATE:08/27/2007                            DOB:          1920-09-27    An 77 year old, white male mostly complaining of hoarseness.  He had  unexplained dyspnea that had improved to the point where he was able to  get to the cafeteria back without stopping with baseline FEV-1 of 106%  predicted documented May 07, 2007, albeit with a ratio of 57% and  diffusion capacity of 63% consistent with a moderate emphysematous  component.   He is on no inhalers and actually takes no respiratory medicines, but is  on maximum treatment directed at the acid component with Nexium b.i.d.  and Zantac nightly.   He has noticed increased hoarseness over the last several weeks, but no  increased dyspnea, orthopnea, PND or leg swelling.   PHYSICAL EXAMINATION:  GENERAL:  He is a pleasant, ambulatory, white  male who does have classic voice fatigue.  VITAL SIGNS:  Afebrile with stable vital signs.  HEENT:  Remarkable only for minimal erythema of the oropharynx.  There  is no excessive postnasal drainage.  NECK:  Supple without cervical adenopathy or tenderness.  Trachea is  midline.  LUNGS:  Clear to auscultation and percussion.  HEART:  Regular rate and rhythm without murmurs, rubs or gallops.  ABDOMEN:  Soft, nontender.  EXTREMITIES:  Without calf tenderness, clubbing, cyanosis or edema.   IMPRESSION:  Although the patient does have emphysematous chronic  obstructive pulmonary disease by pulmonary function tests, he is below  the radar screen because this patient is in a state of geriatric  decline and he is not going to therefore benefit from more aggressive  treatment.  If he begins to have exacerbations, then that would change  the equation, but he assures me that is not the case.   He is having persistent hoarseness and  intermittently has nasal  obstructive symptoms and has already failed treatment under Dr. Glory Buff direction, so I have asked him to see Dr. Izora Gala for  further evaluation of his hoarseness if it continues to be a problem.  Pulmonary followup can be p.r.n.     Christena Deem. Melvyn Novas, MD, [facility]  Electronically Signed    MBW/MedQ  DD: 08/27/2007  DT: 08/28/2007  Job #: [id number]   cc:   Lillette Boxer. Sabra Heck, M.D.

## 2011-04-30 NOTE — Assessment & Plan Note (Signed)
[place] CARDIOLOGY OFFICE NOTE   Dustin Blankenship, Dustin Blankenship                        MRN:          [id number]  DATE:05/26/2008                            DOB:          12-01-1920    An 77 year old patient referred for dyspnea, COPD, and cardiomegaly on  chest x-ray.   Dustin Blankenship is a delightful 77 year old patient who has enjoyed unusually  good health.  He is a retired Licensed conveyancer.   He has had longstanding COPD.  He has been seen by Dr. Christinia Gully in  our office with moderate obstructive disease.  He has been treated  aggressively for reflux with some improvement.  He has an asthmatic  component and has inhalers.   He is a nonsmoker.   I have reviewed the patient's previous chest x-rays.  He has always had  some cardiac enlargement which is likely due to his lung disease.   He has had no previous documented history of coronary artery disease,  valvular disease, or heart murmur.   I do not have an EKG on the patient.   In talking to him, he is doing well.  He is not having chest pain.  There is no palpitations or presyncope.  He has chronic exertional  dyspnea which is unchanged and actually improved since he has been  seeing Dr. Melvyn Novas.   He has not had any lower extremity edema.   The patient's health has actually been quite good for his age.   His past medical history medical history is remarkable for COPD, reflux, and prostatism.   He has had a previous appendectomy in 1938, broken shoulder in 1973,  knee surgery in 1983, prostate surgery in 1984 and 1985, and parathyroid  surgery in 1995.   His medications include;  1. Lumigan eye drops.  2. Nexium 40 b.i.d.  3. Tylenol p.r.n.  4. Xanax.  5. Asmanex.  6. Flomax 0.4 a day.  7. Gabapentin 30 mg at bedtime.   He is allergic to SULFA.   SOCIAL HISTORY:  The patient is happily married for 48 years.  He is  originally from [place].  He is a [employer] led.  Actually, he worked  for [employer].  He is active.  He walks on a regular basis and has 3 children  and 6 grandchildren as well as great-grandchildren.   He does a lot of his walking and work outside  Family history is noncontributory.   Review of his chest x-ray from April 11, 2008, shows COPD with basilar  atelectasis and cardiomegaly.   PHYSICAL EXAMINATION:  The patient's exam is remarkable for an elderly  white male who looks younger than his stated age.  Weight is 195, pulse is 62, afebrile, and blood pressure is 130/70.  HEENT:  Unremarkable.  Carotids are normal without bruit.  No lymphadenopathy, thyromegaly, or  JVP elevation.  LUNGS:  Clear, good diaphragmatic motion.  No wheezing.  S1 and S2 normal heart sounds.  PMI normal.  No RV heave.  ABDOMEN:  Benign.  Bowel sounds positive.  No AAA.  No  bruit.  No  tenderness.  No hepatosplenomegaly.  No hepatojugular reflux.  No  tenderness.  Distal pulses are intact.  No edema.  NEURO:  Nonfocal.  SKIN:  Warm and dry.  No muscular weakness.   IMPRESSION:  1. Dyspnea, likely due to chronic obstructive pulmonary disease,      documented deficiencies.  Follow up with Dr. Melvyn Novas.  Continue      Asmanex.  2. Question of cardiomegaly.  The patient has a tortuous aorta.  He      may have some right ventricular enlargement due to his chronic      obstructive pulmonary disease.  We will check a 2-D echocardiogram,      but I doubt he has significant right ventricular or left      ventricular dysfunction and he will not likely need any further      workup outside of his echo.  3. Arthritis, particularly in the knees.  Continue nonsteroidals as      needed.  4. Cataracts.  Vision fairly good.  Follow up with the eye doctor.   The patient does not need a stress test at this time.  His cardiomegaly  appears benign and is likely reflective of his right heart disease.     Wallis Bamberg. Johnsie Cancel, MD, [facility]  Electronically Signed     PCN/MedQ  DD: 05/26/2008  DT: 05/27/2008  Job #: [id number]   cc:   Bonne Dolores, M.D.

## 2011-04-30 NOTE — Assessment & Plan Note (Signed)
[place]                             PULMONARY OFFICE NOTE   NAME:Dustin Blankenship, Dustin Blankenship                        MRN:          [id number]  DATE:06/15/2007                            DOB:          1920/10/03    HISTORY:  This is an 77 year old white male, former smoker with  spirometry indicating mild to moderate airflow obstruction with  inspiratory truncation suggesting a large upper airway component.  He  was therefore treated aggressively for reflux and returns here today on  no pulmonary medications at all but maintaining Nexium b.i.d. plus  Zantac at bedtime, feeling the best he has felt in a long time.  He  denies any fevers, chills, sweats, orthopnea, PND or leg swelling.  He  is able to walk to the cafeteria and now back and forth without  stopping.   PHYSICAL EXAMINATION:  GENERAL:  He is a pleasant, elderly, ambulatory  white male in no acute distress.  He has stable vital signs.  HEENT:  Unremarkable, oropharynx clear.  LUNG FIELDS:  Clear bilaterally to auscultation and percussion.  HEART:  Regular rate and rhythm without murmur, gallop, rub.  ABDOMEN:  Soft, benign.  EXTREMITIES:  Warm without calf tenderness, cyanosis, clubbing, or  edema.   IMPRESSION:  Marked improvement in activity tolerance by treating reflux  aggressively.  The one caveat here, though, is that he has also been on  prednisone at 5 mg per day for arthritis and if he does have any asthma,  it may well be that as he tapers off of it that his respiratory symptoms  will relapse.   He does have a new benchmark of being able to walk back and forth to  the cafeteria at his nursing home without difficulty and I told him that  unless he notices a decline in activity tolerance or increased cough  there would be no reason to change therapy of the next 3 months, having  had such a convincing response to empiric therapy directed at reflux.   Followup will be in 3 months, sooner if  needed.     Christena Deem. Melvyn Novas, MD, [facility]  Electronically Signed    MBW/MedQ  DD: 06/15/2007  DT: 06/16/2007  Job #: [id number]   cc:   Posey Rea, M.D.

## 2011-05-03 NOTE — Discharge Summary (Signed)
NAMECHARLEE, Dustin Blankenship                 ACCOUNT NO.:  [id number]   MEDICAL RECORD NO.:  [id number]          PATIENT TYPE:  INP   LOCATION:  3703                         FACILITY:  [facility]   PHYSICIAN:  Edythe Lynn, M.D.       DATE OF BIRTH:  [birth year]   DATE OF ADMISSION:  01/11/2007  DATE OF DISCHARGE:  01/13/2007                               DISCHARGE SUMMARY   PRIMARY CARE PHYSICIAN:  Dr. Alain Honey.   DISCHARGE DIAGNOSIS:  1. COPD exacerbation, improving.  2. Osteoarthritis.  3. Mild urinary tract infection, resolved.  4. Hyponatremia, resolving.  5. Left ventricular hypertrophy.   DISCHARGE MEDICATIONS:  1. Levaquin 500 mg by mouth daily for 4 days.  2. Xalatan.  3. Nexium 40 mg daily.  4. Fish oil one capsule daily.  5. Spiriva one capsules daily.  6. Proventil one puff four times a day.  7. Prednisone taper   CONDITION ON DISCHARGE:  Dustin Blankenship was discharged in good condition at  time of discharge.  The patient was afebrile with stable vital signs.  The patient was instructed to follow up with his primary care physician  Dr. Sabra Heck.  At the time of discharge, the patient was ambulating the  floor without any assistance with oxygen saturations of 94-95% on room  air.   PROCEDURES DURING THIS ADMISSION:  On June 12 1007, the patient  underwent a portable chest x-ray with findings of chronic bibasilar  interstitial opacities.  No acute abnormalities.   CONSULTATION:  No consultations were obtained.  For admission history  and physical refer to the dictated H&P done by Dr. Wendi Snipes January 11, 2007.   HOSPITAL COURSE:  1. COPD EXACERBATION.  Dustin Blankenship was admitted to acute care unit at      [facility] where he was placed on intravenous steroids,      intravenous antibiotics, oxygen nebulizers.  The patient's wheezing      and shortness of breath improved rapidly and he was tapered to oral      prednisone and oral antibiotics.  By January 13, 2007, the  patient      was in no acute distress with good oxygen saturations on room air      and he was felt that he      was safe to be discharged home to further complete his steroid      taper.  2. Hyponatremia.  This was most likely secondary to acute SIADH      secondary to respiratory distress.  The patient's sodium improved      throughout the hospitalization.  By the time of discharge being      132.      Edythe Lynn, M.D.  Electronically Signed     SL/MEDQ  D:  01/13/2007  T:  01/14/2007  Job:  [id number]   cc:   Lillette Boxer. Sabra Heck, M.D.

## 2011-05-03 NOTE — Procedures (Signed)
   NAME:  Dustin Blankenship, Dustin Blankenship                           ACCOUNT NO.:  [id number]   MEDICAL RECORD NO.:  [id number]                   PATIENT TYPE:   LOCATION:                                       FACILITY:   PHYSICIAN:  Edward L. [name], M.D.             DATE OF BIRTH:  [birth year]   DATE OF PROCEDURE:  DATE OF DISCHARGE:                                EKG INTERPRETATION   The rhythm is a sinus rhythm with a rate of about 90.  Normal EKG.                                               Edward L. [name], M.D.    ELH/MEDQ  D:  10/08/2002  T:  10/08/2002  Job:  [id number]

## 2011-05-03 NOTE — Procedures (Signed)
NAME:  Dustin Blankenship, Dustin Blankenship                           ACCOUNT NO.:  [id number]   MEDICAL RECORD NO.:  [id number]                   PATIENT TYPE:  OUT   LOCATION:  RAD                                  FACILITY:  [facility]   PHYSICIAN:  Shelva Majestic, M.D.                  DATE OF BIRTH:  [birth year]   DATE OF PROCEDURE:  04/29/2003  DATE OF DISCHARGE:  04/29/2003                                  ECHOCARDIOGRAM   PROCEDURE:  Technically this was a comprehensive 2-dimensional  echocardiographic Doppler study.   CARDIOLOGIST:  Shelva Majestic, M.D.   INDICATIONS FOR PROCEDURE:  Dyspnea.   DESCRIPTION OF PROCEDURE:  There is evidence for mild concentric left  ventricular hypertrophy with wall thickness at 14 mm septally and 12 mm  posteriorly.  Left ventricular end diastolic and end systolic dimensions  were normal at 35 mm and 27 mm respectively.  Systolic function was normal  with an ejection fraction of at least 55%.  There were no focal segmental  wall motion abnormalities.  There was a suggestion of borderline mild delay  in diastolic relaxation.  The left atrial dimension was moderately increased  at 48 mm.  The right atrium was upper normal.  The right ventricle was  normal.  The aortic root dimension was normal at 28 mm.  The aortic valve  was tri-leaflet.  There was a sclerosis of the aortic valve without  stenosis.  There also was moderate common annular calcification which seemed  to extend into the aortic leaflet.  There was no obvious vegetation.  There  was no evidence for any aortic regurgitation.  There was mild common annular  calcification, as well as mild mitral annular calcification.  The mitral  valve leaflets, however, were delicate.  Frank mitral valve prolapse was not  demonstrated.  There was trivial/physiologic mitral regurgitation, not felt  to be significant.  There was at least mild to perhaps mild to moderate  tricuspid regurgitation.  The pulmonic valve was normal.   There were no  intermyocardial masses or __________ effusion seen.    IMPRESSION:  Technically this is a 2-D echocardiographic Doppler study,  demonstrating mild concentric left ventricular hypertrophy with borderline  minimal delay in diastolic relaxation.  There is moderate left atrial  enlargement, common annular calcification with aortic valve sclerosis, as  well as mild mitral annular calcification.  There is trivial MR, as well as  at least mild to perhaps mild to moderate tricuspid regurgitation.                                                 Shelva Majestic, M.D.    TK/MEDQ  D:  05/02/2003  T:  05/02/2003  Job:  [id number]   cc:  Halford Chessman, M.D.  [address]  [place]  [place] [address]  Fax: [phone]

## 2011-05-03 NOTE — Procedures (Signed)
NAMECERRONE, Dustin Blankenship                             ACCOUNT NO.:  [id number]   MEDICAL RECORD NO.:  [id number]                   PATIENT TYPE:   LOCATION:                                       FACILITY:   PHYSICIAN:  Edward L. [name], M.D.             DATE OF BIRTH:   DATE OF PROCEDURE:  DATE OF DISCHARGE:                              PULMONARY FUNCTION TEST   IMPRESSION:  1. Spirometry shows no ventilatory defect in airflow obstruction at the     level of the small airways.  2. Lung volumes are normal.  3. DLCO is moderately reduced.  4. There is mild resting hypoxemia.                                               Edward L. [name], M.D.    Dustin Blankenship  D:  05/11/2003  T:  05/11/2003  Job:  [id number]   cc:   Halford Chessman, M.D.  [address]  [place]  [place] [address]  Fax: [phone]

## 2011-05-03 NOTE — Op Note (Signed)
NAME:  NDREW, CREASON                           ACCOUNT NO.:  [id number]   MEDICAL RECORD NO.:  [id number]                   PATIENT TYPE:  AMB   LOCATION:  DAY                                  FACILITY:  [facility]   PHYSICIAN:  Hildred Laser, M.D.                 DATE OF BIRTH:  17-Jul-1920   DATE OF PROCEDURE:  10/04/2002  DATE OF DISCHARGE:                                 OPERATIVE REPORT   PROCEDURE:  Esophagogastroduodenoscopy.   INDICATION:  Mr. Damiano is an 77 year old, Caucasian male with refractory  symptoms of GERD.  He presented mainly with laryngeal and pharyngeal  symptoms.  He also has had intermittent epigastric discomfort.  He was  initially seen about nine weeks ago.  He has been treated with Nexium and  Carafate, and he had modest results with frequent breakthrough symptoms.  I  saw him about a month ago and added domperidone.  This seemed to help him  for a short while.  He feels miserable.  He is not able to enjoy his meals.  He has not had any dysphagia, nausea, vomiting, or melena.  He is undergoing  diagnostic EGD.  The procedure risks were reviewed with the patient, and  informed consent was obtained.   PREOPERATIVE MEDICATIONS:  Cetacaine spray for oropharyngeal topical  anesthesia, Demerol 25 mg IV, Versed 4 mg IV in divided dose.   INSTRUMENT:  Olympus video system.   FINDINGS:  Procedure performed in endoscopy suite.  The patient's vital  signs and O2 saturations were monitored during the procedure and remained  stable.  The patient was placed in the left lateral decubitus position, and  the scope passed through oropharynx without any difficulty to his esophagus.  The laryngeal _______, and there appeared to edema and erythema of  interarytenoid folds.   ESOPHAGUS:  Mucosa of the esophagus was normal throughout.  Squamocolumnar  junction was serrated or wavy.  There was a small sliding hiatal hernia.  No  ring or stricture was noted.   STOMACH:  It was  empty and distended without any evidence of inflammation.  Folds in the proximal stomach normal.  Examination of the mucosa revealed  patchy erythema, edema, and linear streaks right at the antrum, but there  were no erosions or ulcers.  Pyloric channel was patent.  Angularis, fundus  were examined by the flexible endoscope and were normal.   DUODENUM:  Examined the bulb and the second pyloric jejunum was normal.  Endoscope was withdrawn.  The patient tolerated the procedure well.   FINAL DIAGNOSES:  1. Serrated gastroesophageal junction with small sliding hiatal hernia with     no endoscopic evidence of reflux esophagitis.  2. Gastritis.  CLOtest in the past has been negative (over five years ago).    RECOMMENDATIONS:  Will continue present therapy but will check CBC, Chem-20,  and H. pylori serology today.  Further  recommendations will be made after  lab studies reviewed.                                               Hildred Laser, M.D.    NR/MEDQ  D:  10/04/2002  T:  10/04/2002  Job:  [id number]   cc:   Halford Chessman, M.D.  [address]  [place]  [place] [address]  Fax: [phone]

## 2011-05-03 NOTE — H&P (Signed)
Dustin Blankenship, Dustin Blankenship                 ACCOUNT NO.:  [id number]   MEDICAL RECORD NO.:  [id number]          PATIENT TYPE:  EMS   LOCATION:  MAJO                         FACILITY:  [facility]   PHYSICIAN:  Dustin Doom, MD       DATE OF BIRTH:  27-Mar-1920   DATE OF ADMISSION:  01/11/2007  DATE OF DISCHARGE:                              HISTORY & PHYSICAL   PRIMARY CARE PHYSICIAN:  Dr. Teodoro Spray.   PRESENTING COMPLAINT:  Cough, sinus congestion and shortness of breath.   HISTORY OF PRESENT ILLNESS:  This is an 77 year old white male patient  who came into hospital because of cough, shortness of breath and sinus  congestion.  Patient was taking some Avalox at home for about 2 days  before he came into hospital because of the above problems.  He denies  any chest pain.  No nausea, no vomiting and no diarrhea.  No abdominal  pain.  He denies headaches.  No dizziness.  He denies any dysuria.  No  feet swelling.  He has a history of a degenerative arthritis.  Diskus  pain from time to time.  Patient takes Darvocet-N at home for the pain.  At the ED, patient got some albuterol nebulized.  Patient was still  having oxygen saturation dropping with ambulation.   PAST MEDICAL HISTORY:  Significant for chronic obstructive pulmonary  disease.   HOME MEDICATIONS:  Include:  1. Prednisone 5 mg daily.  2. Darvocet-N.  3. Protonix.   SOCIAL HISTORY:  He lives with his wife.  He does not smoke cigarettes.  He does not use alcohol.   FAMILY HISTORY:  Not contributory at this time.   HE HAS ALLERGIES TO PENICILLIN AND SULFA.   PHYSICAL EXAMINATION:  VITAL SIGNS:  Shows temperature of 99.7.  Blood  pressure of 107/58.  He is slight tachycardic.  Heart rate of 114.  Respiratory rate of 18.  Oxygen saturation 97% on 2 liters.  His  oxygenation drops to 80s with ambulation.  HEENT:  Shows pink conjunctivae.  He has no jaundice.  His neck is  supple.  His mucous membranes is moist.  CHEST:  Shows  diminished breath sounds bilateral.  No wheezing.  No  crackles.  CARDIOVASCULAR:  Shows normal heart sounds.  No murmur and no gallop.  Pulses are palpable in his limbs.  ABDOMEN:  Soft, nontender.  No masses palpable.  Patient has normal  bowel sounds.  EXTREMITIES:  Shows no edema.  CENTRAL NERVOUS SYSTEM:  Patient is alert and oriented x3.  Power is 4/4  in all limbs.  Sensation are intact.   LAB WORK:  Shows WBC of 3.1, which is slightly elevated.  Hemoglobin  13.6, hematocrit 40.2, platelets of 294.  Urinalysis:  Urine is cloudy  and positive for leukocyte esterase.  Urine WBC is 3-6 which is slightly  elevated.  Chemistry shows a sodium of 128, potassium of 4.2, chloride  of 96, bicarb of 24, BUN of 16, creatinine of 0.9 and glucose of 115.  Chest x-ray shows no acute changes.  Chest x-ray shows  COPD.  EKG shows  normal sinus rhythm with low voltage.   ASSESSMENT:  This is an 77 year old male patient with:  1. Chronic obstructive pulmonary disease exacerbation.  2. History of degenerative arthritis.  3. He also has a urinary tract infection.  4. Leukocytosis.  5. Hyponatremia.   My plan is to admit patient to telemetry floor for IV Solu-Medrol 50 mg  q.8 hours.  IV Zithromax 500 mg q.24 hours.  Atrovent and albuterol nebs  q.6 hours as needed only if patient request for treatment.  Patient  complained that albuterol and Atrovent nebs make him to have tremors and  insomnia.  So, I am going to use the breathing treatment when patient  request it.  I will also use Tylenol or Dilaudid for pain and also I  will give patient some IV fluids, normal saline to control low sodium  about with a rate of 60 cc per hour.  Patient will get IV Solu-Medrol  and IV Levaquin.  Levaquin will be to treat both the bronchitis,  sinusitis and urinary tract infection.  I will stop Zithromax.      Dustin Doom, MD  Electronically Signed     GA/MEDQ  D:  01/11/2007  T:  01/12/2007  Job:   [id number]

## 2011-05-03 NOTE — Assessment & Plan Note (Signed)
[facility]                             PULMONARY OFFICE NOTE   Dustin Blankenship, Dustin Blankenship                        MRN:          [id number]  DATE:03/19/2007                            DOB:          [birth year]    REFERRING PHYSICIAN:  Lillette Boxer. Sabra Heck, M.D.   PULMONARY CONSULTATION   CHIEF COMPLAINT:  Dyspnea.   HISTORY:  An 77 year old white male who has carried the diagnosis of  emphysema but quit smoking in 1966 and denied dyspnea until 6 years ago.  Since then he has had a progressive decline in his best-day function to  the point where even on his best days he can only walk half way to the  dining room at his present assisted living.  There is variability in  that when he takes prednisone for his arthritis his breathing is better,  and when he has itching, sneezing, and runny nose from seasonal  rhinitis, he feels worse.  He had been tried on Spiriva, but found it  locked up his bladder, and could not tolerate it.  Apparently, Advair  gave him some kind of reaction, but he cannot remember what it was, but  did not seem to help.   The patient denies any exertional pleuritic pain, fevers, chills,  sweats, orthopnea, PND, or leg swelling, unintended weight loss or  weight gain, although note that he has gained from 175 when he quit  smoking up to 212 now.   PAST MEDICAL HISTORY:  Significant for allergies, for which he has  undergone evaluation by Dr. Caprice Red, and found to have allergies  to everything.  He uses aspirin, which helps some, claritin, which  helps for a little bit, but cannot use any other antihistamines because  of tendency to bladder gets blocked.   ALLERGIES:  Sulfa causes rash.   MEDICATIONS:  Taken in detail on the worksheet dated March 19, 2007.  Note, the patient just finished prednisone for arthritis on March 31 and  is feeling that his breathing is a bit worse, and his arthritis is a bit  worse since he stopped it.   Arthritis is actually more bothersome now  than acute breathing difficulty.   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  No evidence of asbestos exposure.   FAMILY HISTORY:  Taken in detail and significant for the absence of  respiratory diseases or premature heart disease.   REVIEW OF SYSTEMS:  Taken in detail and significant for the absence of  additional complaints.   PHYSICAL EXAMINATION:  This is a somber but not overtly depressed  ambulatory white male in no acute distress.  VITAL SIGNS:  Stable vital signs.  HEENT:  Nasal turbinates reveal minimal edema.  Oropharynx is clear.  NECK:  Supple without cervical adenopathy or tenderness. Ear canals are  clear bilaterally.  LUNGS:  Reveal hyperresonance to percussion with minimal Hoover sign at  the end of inspiration.  The breath sounds are diminished with  no  wheezing.  CARDIAC:  Regular rate and rhythm without murmur, gallop, or rub.  S1,  S2 were diminished.  There was no increase in P2.  ABDOMEN:  Soft and benign.  No palpable organomegaly, mass, or  tenderness.  EXTREMITIES:  Warm without calf tenderness, cyanosis, clubbing, or  edema.   I reviewed his chest x-ray from January 11, 2007 and find it reveals  only mild COPD with a minimum increase in markings in the bases.  Hospitalization was also reviewed from [facility] dated January 27 through  January 29 with COPD exacerbation as the diagnosis and discharged on  Spiriva with p.r.n. Proventil and prednisone tapering.   IMPRESSION:  This patient clearly has chronic obstructive pulmonary  disease, but probably has significant asthmatic bronchitic component  based on the fact that he has prominent rhinitis features that typically  exacerbate his breathing difficulty, and that he seems so much better  while on prednisone.  Note that he is intolerant to  anticholinergics in  multiple forms (even antihistamines with anticholinergic properties  cannot be tolerated because of the severity of his prostatic  obstruction).   I emphasized to the patient that I like to look at the cup half full  rather than the cup half empty, and emphasized to him that we should be  able to maintain him at the same level of function he enjoys while on  prednisone without needing the prednisone if we can get him on the right  inhaler or combinations of inhalers or nebulizers.  Since he has already  tried Advair, which is the only approved steroid inhaler for COPD, I am  going to recommend an off-label trial of Symbicort at 80/4.5 two puffs  b.i.d., but spent extra time teaching him optimal MDI technique, which  he mastered to only 60% effectiveness.  If he sees that this is  partially effective, unless he is having significant excess tremor, I  will continue this.  Otherwise, we might switch him over to  Brovana/budesonide per nebulizer on his next visit.   I spent extra time teaching him optimal MDI technique today and  reviewing all of the above issues with him and his wife.   He does plan to see Dr. Charlestine Night, and I emphasized that Dr. Charlestine Night would  coordinate the titration of the prednisone dosage, but from my  perspective, it is rare to need chronic systemic steroids in this  setting.  Therefore, regardless of the dose of prednisone he is using, I  would like him to continue the Symbicort to assure optimal deposition of  the medication to the target  tissues, but to use prednisone exposure to help [place] know to what extent  he improves the reversible component of his problem in terms of activity  tolerance.     Christena Deem. Melvyn Novas, MD, [facility]  Electronically Signed    MBW/MedQ  DD: 03/19/2007  DT: 03/19/2007  Job #: [id number]   cc:   Lillette Boxer. Sabra Heck, M.D.  Lindaann Slough, M.D.

## 2011-05-04 ENCOUNTER — Emergency Department (HOSPITAL_COMMUNITY): Payer: Medicare Other

## 2011-05-04 ENCOUNTER — Emergency Department (HOSPITAL_COMMUNITY)
Admission: EM | Admit: 2011-05-04 | Discharge: 2011-05-04 | Disposition: A | Discharge status: Home / Self Care | Payer: Medicare Other | Attending: Emergency Medicine | Admitting: Emergency Medicine

## 2011-05-04 DIAGNOSIS — J4489 Other specified chronic obstructive pulmonary disease: Secondary | ICD-10-CM | POA: Insufficient documentation

## 2011-05-04 DIAGNOSIS — J449 Chronic obstructive pulmonary disease, unspecified: Secondary | ICD-10-CM | POA: Insufficient documentation

## 2011-05-04 DIAGNOSIS — M25569 Pain in unspecified knee: Secondary | ICD-10-CM | POA: Insufficient documentation

## 2011-05-04 DIAGNOSIS — I4891 Unspecified atrial fibrillation: Secondary | ICD-10-CM | POA: Insufficient documentation

## 2011-05-04 DIAGNOSIS — Z79899 Other long term (current) drug therapy: Secondary | ICD-10-CM | POA: Insufficient documentation

## 2011-05-13 ENCOUNTER — Emergency Department (HOSPITAL_COMMUNITY)
Admission: EM | Admit: 2011-05-13 | Discharge: 2011-05-13 | Disposition: A | Discharge status: Home / Self Care | Payer: Medicare Other | Attending: Emergency Medicine | Admitting: Emergency Medicine

## 2011-05-13 DIAGNOSIS — M25469 Effusion, unspecified knee: Secondary | ICD-10-CM | POA: Insufficient documentation

## 2011-05-13 DIAGNOSIS — J4489 Other specified chronic obstructive pulmonary disease: Secondary | ICD-10-CM | POA: Insufficient documentation

## 2011-05-13 DIAGNOSIS — I4891 Unspecified atrial fibrillation: Secondary | ICD-10-CM | POA: Insufficient documentation

## 2011-05-13 DIAGNOSIS — M25569 Pain in unspecified knee: Secondary | ICD-10-CM | POA: Insufficient documentation

## 2011-05-13 DIAGNOSIS — J449 Chronic obstructive pulmonary disease, unspecified: Secondary | ICD-10-CM | POA: Insufficient documentation

## 2011-05-13 LAB — SYNOVIAL CELL COUNT + DIFF, W/ CRYSTALS
Eosinophils-Synovial: 0 % (ref 0–1)
Neutrophil, Synovial: 80 % — ABNORMAL HIGH (ref 0–25)
WBC, Synovial: 67000 /mm3 — ABNORMAL HIGH (ref 0–200)

## 2011-05-14 ENCOUNTER — Emergency Department (HOSPITAL_COMMUNITY): Payer: Medicare Other

## 2011-05-14 ENCOUNTER — Inpatient Hospital Stay (HOSPITAL_COMMUNITY)
Admission: EM | Admit: 2011-05-14 | Discharge: 2011-05-17 | DRG: 554 | Disposition: A | Discharge status: Skilled Nursing Facility | Payer: Medicare Other | Attending: Internal Medicine | Admitting: Internal Medicine

## 2011-05-14 DIAGNOSIS — M109 Gout, unspecified: Principal | ICD-10-CM | POA: Diagnosis present

## 2011-05-14 DIAGNOSIS — N401 Enlarged prostate with lower urinary tract symptoms: Secondary | ICD-10-CM | POA: Diagnosis present

## 2011-05-14 DIAGNOSIS — E039 Hypothyroidism, unspecified: Secondary | ICD-10-CM | POA: Diagnosis present

## 2011-05-14 DIAGNOSIS — R339 Retention of urine, unspecified: Secondary | ICD-10-CM | POA: Diagnosis present

## 2011-05-14 DIAGNOSIS — J4489 Other specified chronic obstructive pulmonary disease: Secondary | ICD-10-CM | POA: Diagnosis present

## 2011-05-14 DIAGNOSIS — IMO0002 Reserved for concepts with insufficient information to code with codable children: Secondary | ICD-10-CM | POA: Diagnosis present

## 2011-05-14 DIAGNOSIS — I4891 Unspecified atrial fibrillation: Secondary | ICD-10-CM | POA: Diagnosis present

## 2011-05-14 DIAGNOSIS — H409 Unspecified glaucoma: Secondary | ICD-10-CM | POA: Diagnosis present

## 2011-05-14 DIAGNOSIS — N138 Other obstructive and reflux uropathy: Secondary | ICD-10-CM | POA: Diagnosis present

## 2011-05-14 DIAGNOSIS — J449 Chronic obstructive pulmonary disease, unspecified: Secondary | ICD-10-CM | POA: Diagnosis present

## 2011-05-14 DIAGNOSIS — Z950 Presence of cardiac pacemaker: Secondary | ICD-10-CM

## 2011-05-14 DIAGNOSIS — G609 Hereditary and idiopathic neuropathy, unspecified: Secondary | ICD-10-CM | POA: Diagnosis present

## 2011-05-14 DIAGNOSIS — M1710 Unilateral primary osteoarthritis, unspecified knee: Secondary | ICD-10-CM | POA: Diagnosis present

## 2011-05-14 LAB — CBC
HCT: 38.3 % — ABNORMAL LOW (ref 39.0–52.0)
MCHC: 32.1 g/dL (ref 30.0–36.0)
Platelets: 307 10*3/uL (ref 150–400)
RDW: 13.8 % (ref 11.5–15.5)
WBC: 14.9 10*3/uL — ABNORMAL HIGH (ref 4.0–10.5)

## 2011-05-14 LAB — BASIC METABOLIC PANEL
BUN: 30 mg/dL — ABNORMAL HIGH (ref 6–23)
Calcium: 9.9 mg/dL (ref 8.4–10.5)
Chloride: 97 mEq/L (ref 96–112)
Creatinine, Ser: 1.02 mg/dL (ref 0.4–1.5)
GFR calc Af Amer: >60 mL/min (ref >60)

## 2011-05-14 LAB — PROTIME-INR: INR: 2.45 — ABNORMAL HIGH (ref 0.00–1.49)

## 2011-05-14 LAB — URINALYSIS, ROUTINE W REFLEX MICROSCOPIC
Glucose, UA: NEGATIVE mg/dL
Protein, ur: NEGATIVE mg/dL
Urobilinogen, UA: 0.2 mg/dL (ref 0.0–1.0)

## 2011-05-14 LAB — DIFFERENTIAL
Basophils Absolute: 0 10*3/uL (ref 0.0–0.1)
Basophils Relative: 0 % (ref 0–1)
Eosinophils Absolute: 0.1 10*3/uL (ref 0.0–0.7)
Eosinophils Relative: 1 % (ref 0–5)
Lymphocytes Relative: 11 % — ABNORMAL LOW (ref 12–46)
Monocytes Absolute: 1.8 10*3/uL — ABNORMAL HIGH (ref 0.1–1.0)

## 2011-05-15 LAB — DIFFERENTIAL
Basophils Absolute: 0 10*3/uL (ref 0.0–0.1)
Basophils Relative: 0 % (ref 0–1)
Eosinophils Relative: 0 % (ref 0–5)
Monocytes Absolute: 0.8 10*3/uL (ref 0.1–1.0)

## 2011-05-15 LAB — URINE CULTURE

## 2011-05-15 LAB — CBC
MCHC: 32.1 g/dL (ref 30.0–36.0)
Platelets: 283 10*3/uL (ref 150–400)
RDW: 13.6 % (ref 11.5–15.5)
WBC: 13.6 10*3/uL — ABNORMAL HIGH (ref 4.0–10.5)

## 2011-05-15 LAB — PROTIME-INR
INR: 2.23 — ABNORMAL HIGH (ref 0.00–1.49)
Prothrombin Time: 24.8 seconds — ABNORMAL HIGH (ref 11.6–15.2)

## 2011-05-15 LAB — BASIC METABOLIC PANEL
Calcium: 9.7 mg/dL (ref 8.4–10.5)
GFR calc Af Amer: >60 mL/min (ref >60)
GFR calc non Af Amer: >60 mL/min (ref >60)
Sodium: 134 mEq/L — ABNORMAL LOW (ref 135–145)

## 2011-05-15 LAB — URIC ACID: Uric Acid, Serum: 7 mg/dL (ref 4.0–7.8)

## 2011-05-16 LAB — DIFFERENTIAL
Basophils Absolute: 0 10*3/uL (ref 0.0–0.1)
Lymphocytes Relative: 17 % (ref 12–46)
Lymphs Abs: 2.5 10*3/uL (ref 0.7–4.0)
Monocytes Absolute: 2.1 10*3/uL — ABNORMAL HIGH (ref 0.1–1.0)
Monocytes Relative: 14 % — ABNORMAL HIGH (ref 3–12)
Neutro Abs: 10.2 10*3/uL — ABNORMAL HIGH (ref 1.7–7.7)

## 2011-05-16 LAB — BASIC METABOLIC PANEL
CO2: 36 mEq/L — ABNORMAL HIGH (ref 19–32)
Calcium: 10.1 mg/dL (ref 8.4–10.5)
Glucose, Bld: 91 mg/dL (ref 70–99)
Sodium: 135 mEq/L (ref 135–145)

## 2011-05-16 LAB — CBC
HCT: 39.6 % (ref 39.0–52.0)
Hemoglobin: 12.6 g/dL — ABNORMAL LOW (ref 13.0–17.0)
MCHC: 31.8 g/dL (ref 30.0–36.0)

## 2011-05-16 LAB — BODY FLUID CULTURE: Culture: NO GROWTH

## 2011-05-17 ENCOUNTER — Inpatient Hospital Stay
Admission: RE | Admit: 2011-05-17 | Discharge: 2011-05-21 | Disposition: A | Discharge status: Home / Self Care | Payer: Federal, State, Local not specified - PPO | Source: Ambulatory Visit | Attending: Internal Medicine | Admitting: Internal Medicine

## 2011-05-17 LAB — PROTIME-INR
INR: 3.2 — ABNORMAL HIGH (ref 0.00–1.49)
Prothrombin Time: 32.8 seconds — ABNORMAL HIGH (ref 11.6–15.2)

## 2011-05-17 NOTE — Discharge Summary (Signed)
Dustin Blankenship, Dustin Blankenship                 ACCOUNT NO.:  [id number]  MEDICAL RECORD NO.:  [id number]           PATIENT TYPE:  I  LOCATION:  A330                          FACILITY:  [facility]  PHYSICIAN:  Rexene Alberts, M.D.    DATE OF BIRTH:  [birth year]  DATE OF ADMISSION:  05/14/2011 DATE OF DISCHARGE:  06/01/2012LH                         DISCHARGE SUMMARY-REFERRING   ADDENDUM:  HOSPITAL COURSE:  Please see the previous discharge summary dictated by Dr. Doree Barthel on May 16, 2011.  Over the past 24 hours, the patient has remained hemodynamically stable. He is afebrile.  His pulse rate is 72, respiratory rate 18, blood pressure 115/71, and oxygen saturation 95% on room air.  He still complains of left knee pain.  The pain did intensify this morning after he ambulated to the bathroom with the assistance of his walker and the RN.  He was given Vicodin and colchicine.  Both were felt to help to ease the pain a little.  He is ready to be discharged to the [facility] for further treatment and therapy.  He will be continued on treatment for acute left knee gouty arthritis exacerbation superimposed on severe degenerative joint disease with a prednisone taper and every other day colchicine.  We will add Voltaren gel.  We have made an appointment for him to follow up with orthopedic surgeon, Dr. Aline Brochure on 05/30/2011, at 3:45 p.m.  DISCHARGE MEDICATIONS: 1. Prednisone 15 mg daily through May 23, 2011.  On May 24, 2011,     start 10 mg of prednisone daily through May 30, 2011.  Then, on     May 31, 2011 through June 06, 2011, start prednisone 5 mg daily.     It can be discontinued after the dose on June 06, 2011. 2. Colchicine 0.6 mg every other day through May 23, 2011, and then     p.r.n. thereafter. 3. Voltaren gel, apply to left knee twice daily through May 23, 2011,     then p.r.n. thereafter. 4. Acetaminophen 325 mg two tablets b.i.d. 5. Vicodin 5 mg every 6 hours as needed for  pain. 6. Artificial tears 1 drop in both eyes b.i.d. 7. Aspirin 81 mg daily. 8. Lumigan 0.03 ophthalmic solution 1 drop o.p. at bedtime. 9. Lexapro 10 mg daily. 10.Lasix 40 mg daily. 11.Coumadin give 2.5 mg today and then 5 mg daily, except on Tuesdays     and Thursdays, give 2.5 mg.  The patient's INR will need to be     assessed at least every other day at the skilled nursing facility. 12.Synthroid 125 mcg daily. 13.Protonix 40 mg b.i.d. 14.MiraLax laxative 17 g daily. 15.Sotalol 80 mg daily and 40 mg at bedtime. 16.Flomax 0.4 mg at bedtime. 17.Xanax 0.5 mg every 6 hours as needed for anxiety.  DISCHARGE DISPOSITION:  The patient was discharged to the [facility] in improved and stable condition on May 17, 2011.  He has an appointment to follow up with orthopedic surgeon, Dr. Aline Brochure on May 30, 2011, at 3:45 p.m.  CODE STATUS:  Full code.  DIET:  Heart healthy.  ACTIVITY:  With the physical  therapist.     Rexene Alberts, M.D.     DF/MEDQ  D:  05/17/2011  T:  05/17/2011  Job:  [id number]  cc:   Sherrilee Gilles. Gerarda Fraction, MD Fax: [phone]  Lindaann Slough, M.D. [address] [place] [address]  Carole Civil, M.D. Fax: [phone]  Electronically Signed by Rexene Alberts M.D. on 05/17/2011 12:57:30 PM

## 2011-05-20 LAB — CULTURE, BLOOD (ROUTINE X 2)

## 2011-05-23 NOTE — Discharge Summary (Signed)
  NAMEADOLF, ORMISTON                 ACCOUNT NO.:  [id number]  MEDICAL RECORD NO.:  [id number]           PATIENT TYPE:  I  LOCATION:  A330                          FACILITY:  [facility]  PHYSICIAN:  Gardiner Espana L. Conley Canal, MDDATE OF BIRTH:  Jan 02, 1920  DATE OF ADMISSION:  05/14/2011 DATE OF DISCHARGE:  06/01/2012LH                         DISCHARGE SUMMARY-REFERRING   DISCHARGE DIAGNOSES: 1. Left knee acute gouty arthritis. 2. Deconditioning. 3. Urinary retention, resolved. 4. Benign prostatic hypertrophy. 5. Hypothyroidism. 6. Atrial fibrillation. 7. History of pacemaker. 8. Glaucoma. 9. Hypothyroidism. 10.Degenerative joint disease. 11.Polypharmacy. 12.Right heart failure and pulmonary hypertension. 13.Peripheral neuropathy.  DISCHARGE MEDICATIONS:  To be dictated by discharging physician.  CONDITION:  Stable.  ACTIVITY:  Fall precautions, continue physical therapy.  DIET:  Should be cardiac, warfarin friendly, condition stable.  CONSULTATIONS:  None.  PROCEDURES:  None.  FOLLOWUP:  Follow up with nursing home physician.  Follow up with Dr. Charlestine Night.  LABORATORY DATA:  On admission, white blood cell count was 14,900 (on prednisone), hemoglobin 12.3, hematocrit 38.3, platelet count 307, INR 2.45.  Basic metabolic panel significant for glucose of 142 and BUN of 30 otherwise unremarkable.  Serum uric acid was 7.  PSA 0.14.  Synovial fluid culture negative.  Crystal exam showed intracellular monosodium urate crystals 67,000 white cells.  Urinalysis negative.  Blood cultures negative to date.  Urine culture showed multiple bacterial morphotypes.  DIAGNOSTICS:  Chest x-ray showed hypoventilation and atelectasis.  Left knee x-ray showed large joint effusion and gas bubbles presumably from recent arthrocentesis.  Left hip x-ray is negative for fracture.  HISTORY AND HOSPITAL COURSE:  Please see H and P for admission details. Mr. Sprung is an 77 year old white male who has had  left knee pain and effusion for 3 weeks.  He fell and the pain is very severe a day.  He had had an arthrocentesis which showed intracellular monosodium urate crystals.  He was admitted and given pain medications, physical therapy, prednisone, he still experiencing quite a bit of pain which limits his ambulation, and I will try colchicine tonight and tomorrow.  He will benefit from skilled nursing facility short-term and after much coaxing, he agrees.  DISCHARGING PHYSICIAN:  Will need to dictate final medications.  He had urinary retention and had a Foley placed.  He since have that removed and is voiding well.  His other medical problems remained stable during the hospitalization.     Roann Merk L. Conley Canal, MD     CLS/MEDQ  D:  05/16/2011  T:  05/16/2011  Job:  [id number]  cc:   Sherrilee Gilles. Gerarda Fraction, MD Fax: [phone]  Lindaann Slough, M.D. [place] [address]  Electronically Signed by Doree Barthel MD on 05/23/2011 07:27:49 AM

## 2011-05-30 ENCOUNTER — Ambulatory Visit (INDEPENDENT_AMBULATORY_CARE_PROVIDER_SITE_OTHER): Payer: Medicare Other | Admitting: Orthopedic Surgery

## 2011-05-30 DIAGNOSIS — M25469 Effusion, unspecified knee: Secondary | ICD-10-CM

## 2011-05-30 NOTE — Progress Notes (Signed)
   This is a new patient to our practice  77 year old male referred to [place] by the hospitalist service after a visit to the hospital with a LEFT knee effusion worked up and found to be secondary to gouty arthritis.  In the hospital he was treated for this with joint arthrocentesis which showed monosodium urate crystals and a negative culture of the joint fluid.  History with pain medication physical therapy and prednisone as well as colchicine eventually being sent to a nursing home.  A referral was made for [place] to aspirate the knee however he is on Coumadin his last 2 INRs were 6, and 4.  He complains of continued knee pain and difficulty ambulating since he LEFT the hospital.  He was followed by rheumatologist and ring spur with multiple aspirations as needed.  On evaluation today he is in a wheelchair he has difficulty ambulating has a large joint effusion is 30 of knee flexion is painful range of motion.  Unfortunately at this point I can't do much for him because I don't know what his INR is and is labile.  I've asked him to get his INR level lower than 3 and then I'll see him back to have his knee aspirated.  In the meantime I placed him in a wraparound hinge knee brace I will await his laboratory studies  We will need to refer to his discharge summary for the details of this complex referral.  I have reviewed proximally 14 pages of  Visit based primarily on time spent 20 minutes

## 2011-05-30 NOTE — Patient Instructions (Signed)
We will be able to aspirate knee when your PT/INR is around 2, we will need to see results  Call us when it is down so that we can aspirate your knee

## 2011-05-31 ENCOUNTER — Telehealth: Payer: Self-pay | Admitting: Orthopedic Surgery

## 2011-05-31 NOTE — Telephone Encounter (Signed)
Juliann Pulse, nurse from [employer], called approximately 9:20am today to schedule a fol/up appointment for patient's aspiration, while she was at his home.  She is drawing labs (PT/INR) today and again on Monday.  As per office note from yesterday, 05/30/11, Dr Aline Brochure needs to review results, and we can then schedule.  As of 12:55pm today, no lab results received.  Advised appointment pending results.

## 2011-06-03 ENCOUNTER — Emergency Department (HOSPITAL_COMMUNITY)
Admission: EM | Admit: 2011-06-03 | Discharge: 2011-06-03 | Disposition: A | Discharge status: Home / Self Care | Payer: Medicare Other | Attending: Emergency Medicine | Admitting: Emergency Medicine

## 2011-06-03 ENCOUNTER — Emergency Department (HOSPITAL_COMMUNITY): Payer: Medicare Other

## 2011-06-03 DIAGNOSIS — M1990 Unspecified osteoarthritis, unspecified site: Secondary | ICD-10-CM | POA: Insufficient documentation

## 2011-06-03 DIAGNOSIS — Z7901 Long term (current) use of anticoagulants: Secondary | ICD-10-CM | POA: Insufficient documentation

## 2011-06-03 DIAGNOSIS — J449 Chronic obstructive pulmonary disease, unspecified: Secondary | ICD-10-CM | POA: Insufficient documentation

## 2011-06-03 DIAGNOSIS — M25569 Pain in unspecified knee: Secondary | ICD-10-CM | POA: Insufficient documentation

## 2011-06-03 DIAGNOSIS — J4489 Other specified chronic obstructive pulmonary disease: Secondary | ICD-10-CM | POA: Insufficient documentation

## 2011-06-03 DIAGNOSIS — M109 Gout, unspecified: Secondary | ICD-10-CM | POA: Insufficient documentation

## 2011-06-03 DIAGNOSIS — Z7982 Long term (current) use of aspirin: Secondary | ICD-10-CM | POA: Insufficient documentation

## 2011-06-03 DIAGNOSIS — IMO0002 Reserved for concepts with insufficient information to code with codable children: Secondary | ICD-10-CM | POA: Insufficient documentation

## 2011-06-03 DIAGNOSIS — Y92009 Unspecified place in unspecified non-institutional (private) residence as the place of occurrence of the external cause: Secondary | ICD-10-CM | POA: Insufficient documentation

## 2011-06-03 DIAGNOSIS — W19XXXA Unspecified fall, initial encounter: Secondary | ICD-10-CM | POA: Insufficient documentation

## 2011-06-03 DIAGNOSIS — Z79899 Other long term (current) drug therapy: Secondary | ICD-10-CM | POA: Insufficient documentation

## 2011-06-03 DIAGNOSIS — H409 Unspecified glaucoma: Secondary | ICD-10-CM | POA: Insufficient documentation

## 2011-06-03 DIAGNOSIS — Z950 Presence of cardiac pacemaker: Secondary | ICD-10-CM | POA: Insufficient documentation

## 2011-06-03 DIAGNOSIS — I4891 Unspecified atrial fibrillation: Secondary | ICD-10-CM | POA: Insufficient documentation

## 2011-06-11 ENCOUNTER — Ambulatory Visit (INDEPENDENT_AMBULATORY_CARE_PROVIDER_SITE_OTHER): Payer: Medicare Other | Admitting: Orthopedic Surgery

## 2011-06-11 DIAGNOSIS — M25469 Effusion, unspecified knee: Secondary | ICD-10-CM

## 2011-06-11 NOTE — Progress Notes (Signed)
   Followup visit  The patient had his knee aspirated at the primary care physician's office 20s 8 cc of blood.  This seemed to give him good relief.  He is currently on Colcrys for gout.  We will update Dr. Charlestine Night on his condition which has improved after 2 aspirations.  I have advised him to continue his Colcrys until it runs out and then stop very if his symptoms start up again resumed ColcrysI can aspirate his knee if he has severe pain I would like to know his INR and and needs to be around 3 for me to aspirate the knee

## 2011-06-11 NOTE — Patient Instructions (Signed)
We will forward your notes to Dr. Charlestine Night

## 2011-07-02 ENCOUNTER — Telehealth: Payer: Self-pay | Admitting: Orthopedic Surgery

## 2011-07-02 NOTE — Telephone Encounter (Signed)
Faxed office notes, lab results to Dr. Charlestine Night to fax# [phone], ph # [phone].

## 2011-07-12 NOTE — H&P (Signed)
[name], [name]                 ACCOUNT NO.:  [id number]  MEDICAL RECORD NO.:  [id number]           PATIENT TYPE:  I  LOCATION:  A330                          FACILITY:  [facility]  PHYSICIAN:  Geraldine Solar, MD     DATE OF BIRTH:  [birth year]  DATE OF ADMISSION:  05/14/2011 DATE OF DISCHARGE:  LH                             HISTORY & PHYSICAL   PRIMARY CARE PHYSICIAN:  Sherrilee Gilles. Gerarda Fraction, MD  CODE STATUS:  Full code.  CHIEF COMPLAINT:  Left hip pain and difficulty urinating.  HISTORY OF PRESENT ILLNESS:  [name] is an 77 year old Caucasian man with multiple comorbidities including history of osteoarthritis, history of chronic right knee effusion requiring recurrent multiple aspiration.  He is followed by Dr. Charlestine Night from the rheumatology group. Apparently the patient had a knee tap done by Dr. Charlestine Night couple of weeks ago.  However, the fluid in his knee reaccumulated very rapidly and he came to the ER about a week ago and discharged with pain medicine.  Then, he was seen by Dr. Charlestine Night on Wednesday where his left knee was tapped again and came over the weekend to [facility] ER with similar problem which is left knee pain and he had another tap for his knee.  That was a matter of fact that was yesterday when he came in and he had another tap for his knee that culture of the fluid showed no growth, however, his cell count showed elevated WBCs of 67,000.  No antibiotic was given.  The patient was discharged on Dilaudid.  The patient came back again with reaccumulation of his left knee fluid and pain.  He also had a fall at home early this morning when he was trying to get out of his bedroom, he tripped and fell against his wheelchair and hit his upper chest wall.  Denies any head trauma or any other injuries and a matter of fact, his wife stated that she tried to catch him and she fell with him on the floor.  The patient denies any dizziness or loss of consciousness.  No  palpitation, chest pain, or shortness of breath associated with the fall and it was described as a trip, basically mechanical fall with no preceding symptoms.  The patient is also complaining of urinary retention and discomfort.  He described his urination as dribble only.  Denies any dysuria or hematuria.  Denies any flank pain.  Denies any fever or chills.  In the ED, the patient had a knee x-ray that showed large joint effusion containing gas bubbles from recent arthrocentesis.  Septic joint should be considered.  Also showed moderate degenerative change in the knee.  A Foley catheter was placed for his urinary retention and I was asked to see him for further workup and management.  PAST MEDICAL HISTORY: 1. History of atrial fibrillation, on Coumadin. 2. Status post pacemaker. 3. History of COPD. 4. History of GI bleeding. 5. Glaucoma. 6. History of BPH. 7. Hypothyroidism. 8. Degenerative joint disease. 9. History of right heart failure and pulmonary hypertension. 10.Peripheral neuropathy.  PAST SURGICAL HISTORY: 1. History of  appendectomy. 2. Shoulder fracture repair. 3. History of knee fracture at 77 years of age. 4. History of parathyroid surgery. 5. Cataract surgeries. 6. Bilateral knee surgeries. 7. Prostate surgery.  ALLERGIES:  HE IS ALLERGIC TO SULFA DRUGS AND PENICILLIN.  HOME MEDICATIONS:  Medication reconciliation is still pending at the time of dictation.  However, as per ER note, the patient takes, 1. Tylenol. 2. Protonix 40 mg twice daily. 3. Sotalol 80 mg daily. 4. Levothyroxine 125 mcg daily. 5. Aspirin 81 mg daily. 6. Lasix 40 mg daily. 7. MiraLax 1 capsule daily. 8. Sotalol 40 mg q.p.m. 9. Flomax 0.4 mg q.p.m. 10.Coumadin as directed. 11.Elestat ophthalmic 1 drop twice daily. 12.Systane ophthalmic 1 drop as needed. 13.Lumigan ophthalmic 1 drop twice daily. 14.Lexapro 10 mg daily. 15.Hydrocodone/acetaminophen 10 mg three times a  day. 16.Prednisone 10 mg special dosing. 17.Hydromorphone 4 mg q.6 h p.r.n. 18.Alprazolam 0.5 mg q.h.s.  SOCIAL HISTORY:  The patient lives with his wife at home here in [place] area.  Ex-tobacco user, however, currently not smoking. Denies alcohol or illicit drug use.  He has a home health aide.  FAMILY HISTORY:  Father had lung disease and possible Parkinson's disease.  REVIEW OF SYSTEMS:  As above in HPI.  CHEST:  No shortness of breath. No cough.  CARDIOVASCULAR:  No chest pain.  No palpitation.  No dizziness.  ABDOMEN:  No nausea.  No vomiting.  No change in bowel habits.  GU:  Significant for urinary retention and dribbling of urine. No hematuria.  No dysuria.  No flank pain.  CONSTITUTIONAL:  No fever or chills.  NEUROLOGIC:  No headaches.  No motor weakness.  No numbness. MUSCULOSKELETAL:  The patient is complaining of left knee pain as above in HPI.  He also has history of osteoarthritis and has other joints pain, which is chronic.  PHYSICAL EXAM:  VITAL SIGNS:  Blood pressure 111/63, pulse rate of 80, temperature 98.7, O2 sat 96% on room air. GENERAL:  He is alert, hard of hearing, pleasant, Caucasian man. NECK:  Supple.  No JVD. LUNGS:  Clear to auscultation bilaterally.  He has bruises in his left upper chest, which is tender to deep palpation and he has a pacemaker in his left upper chest.  He moves his chest wall equally with no difficulty. CARDIOVASCULAR:  S1 and S2, irregularly irregular. ABDOMEN:  Obese, soft, distended. EXTREMITIES:  Showed trace edema.  His left knee is swollen.  There is a moderate amount of effusion, mildly warm; however, I did not appreciate any erythema. NEUROLOGIC:  He is alert and oriented x3 and he moves all his extremities, however, exam is limited given his current condition.  LABORATORY DATA:  Basic metabolic potassium of 4.4, BUN 30, creatinine 1.02, INR 2.45.  WBC 14.9, hemoglobin 12.3, hematocrit 38.3.  Urinalysis is  negative, unremarkable.  Labs from yesterday which is synovial fluid showed WBC of 67,000 with 80% neutrophil.  Culture preliminary showed no growth.  RADIOLOGY/IMAGING:  Chest x-ray showed hypoventilation with bibasilar atelectasis.  Left knee x-ray showed large joint effusion containing gas bubbles presumably from recent arthrocentesis.  If this has not been performed, then septic joint should be considered.  Moderate degenerative change in the knee.  ASSESSMENT: 1. Left knee septic arthritis. 2. Urinary retention, possibly secondary to benign prostatic     hypertrophy. 3. Atrial fibrillation, on Coumadin. 4. History of osteoarthritis. 5. Chronic obstructive pulmonary disease.  PLAN: 1. His fluid cell analysis is consistent with septic arthritis.  I  will go ahead and start the patient on IV vancomycin and IV     ciprofloxacin.  Given his penicillin allergy, I will avoid any     cephalosporins in his case because of possible crossed allergy. 2. I will consult his rheumatologist, Dr. Charlestine Night, otherwise if not     available for coverage at [facility], I will consult Orthopedic     Service. 3. His fluid culture currently showed no growth, however, his cell     count is consistent with septic arthritis. 4. Urinary retention, most likely secondary to BPH.  The patient     currently has Foley catheter placed.  I did not appreciate any     distended bladder on exam and his renal function is currently     within normal range.  We will continue him on Flomax and he will     probably benefit from Urology evaluation, either as inpatient or     outpatient. 5. Atrial fibrillation, currently rate controlled.  The patient is on     sotalol, which will continue as per his outpatient dose and     continue Coumadin as per pharmacy dosing. 6. Hypothyroidism.  Continue Synthroid. 7. Osteoarthritis/physical deconditioning.  We will also request     physical therapy, PT/OT evaluation. 8. Pain  control with Dilaudid p.r.n. 9. Code status.  The patient is a full code.                                           ______________________________ Geraldine Solar, MD     SA/MEDQ  D:  05/14/2011  T:  05/14/2011  Job:  [id number]  cc:   Sherrilee Gilles. Gerarda Fraction, MD Fax: [phone]  Lindaann Slough, M.D. [place] [address]  Electronically Signed by Shellee Milo MD on 07/12/2011 03:11:18 PM

## 2011-07-12 NOTE — H&P (Signed)
  NAMECHUCK, CABAN                 ACCOUNT NO.:  [id number]  MEDICAL RECORD NO.:  [id number]           PATIENT TYPE:  I  LOCATION:  A330                          FACILITY:  [facility]  PHYSICIAN:  Geraldine Solar, MD     DATE OF BIRTH:  [birth year]  DATE OF ADMISSION:  05/14/2011 DATE OF DISCHARGE:  LH                             HISTORY & PHYSICAL   ADDENDUM:  On further review of the chart, his cell count analysis under other section showed intracellular monosodium urate crystals and that was discussed with Dr. Tamera Punt from [facility] who stated that his knee effusion is most likely secondary to gouty arthritis rather than septic arthritis and he recommended to treat him for gout.  His Gram-stain as well showed no organisms and his culture of synovial fluid showed no growth.  The patient has already taken prednisone taper which was prescribed by his rheumatologist.  We will go ahead and continue that.  I will discontinue antibiotics for now, however, the patient will need to be monitored very closely and if he develops any fevers or showed any other systemic signs of infection, then septic arthritis should be considered.  However, at this point, finding of elevated synovial fluid, WBCs, and mild leukocytosis can also be seen on gouty arthritis.  On further questioning of the patient, he stated that he had a remote history of gout but, however, he is not on any uric acid lowering agents.  I will go ahead and order for uric acid to be drawn in the a.m., continue with the prednisone for now, and consider orthopedic consult in the a.m. for left knee aspiration.                                           ______________________________ Geraldine Solar, MD     SA/MEDQ  D:  05/14/2011  T:  05/14/2011  Job:  [id number]  cc:   Lindaann Slough, M.D. [address] [place] [address]  Lawrence J. Gerarda Fraction, MD Fax: [phone]  Electronically Signed by Shellee Milo MD on  07/12/2011 03:11:12 PM

## 2011-07-25 HISTORY — PX: NM MYOCAR PERF WALL MOTION: HXRAD629

## 2011-07-25 HISTORY — PX: DOPPLER ECHOCARDIOGRAPHY: SHX263

## 2011-12-20 DIAGNOSIS — H4010X0 Unspecified open-angle glaucoma, stage unspecified: Secondary | ICD-10-CM | POA: Diagnosis not present

## 2011-12-20 DIAGNOSIS — I4891 Unspecified atrial fibrillation: Secondary | ICD-10-CM | POA: Diagnosis not present

## 2011-12-30 DIAGNOSIS — M4800 Spinal stenosis, site unspecified: Secondary | ICD-10-CM | POA: Diagnosis not present

## 2011-12-30 DIAGNOSIS — M545 Low back pain: Secondary | ICD-10-CM | POA: Diagnosis not present

## 2011-12-30 DIAGNOSIS — M25569 Pain in unspecified knee: Secondary | ICD-10-CM | POA: Diagnosis not present

## 2012-01-11 DIAGNOSIS — I495 Sick sinus syndrome: Secondary | ICD-10-CM | POA: Diagnosis not present

## 2012-01-11 DIAGNOSIS — I4891 Unspecified atrial fibrillation: Secondary | ICD-10-CM | POA: Diagnosis not present

## 2012-01-11 DIAGNOSIS — I472 Ventricular tachycardia: Secondary | ICD-10-CM | POA: Diagnosis not present

## 2012-01-23 DIAGNOSIS — I739 Peripheral vascular disease, unspecified: Secondary | ICD-10-CM | POA: Diagnosis not present

## 2012-02-04 DIAGNOSIS — I4891 Unspecified atrial fibrillation: Secondary | ICD-10-CM | POA: Diagnosis not present

## 2012-02-04 DIAGNOSIS — I959 Hypotension, unspecified: Secondary | ICD-10-CM | POA: Diagnosis not present

## 2012-02-04 DIAGNOSIS — I509 Heart failure, unspecified: Secondary | ICD-10-CM | POA: Diagnosis not present

## 2012-02-04 DIAGNOSIS — J449 Chronic obstructive pulmonary disease, unspecified: Secondary | ICD-10-CM | POA: Diagnosis not present

## 2012-02-18 DIAGNOSIS — I4891 Unspecified atrial fibrillation: Secondary | ICD-10-CM | POA: Diagnosis not present

## 2012-03-03 DIAGNOSIS — H1130 Conjunctival hemorrhage, unspecified eye: Secondary | ICD-10-CM | POA: Diagnosis not present

## 2012-03-18 DIAGNOSIS — IMO0002 Reserved for concepts with insufficient information to code with codable children: Secondary | ICD-10-CM | POA: Diagnosis not present

## 2012-03-18 DIAGNOSIS — M5030 Other cervical disc degeneration, unspecified cervical region: Secondary | ICD-10-CM | POA: Diagnosis not present

## 2012-03-18 DIAGNOSIS — M999 Biomechanical lesion, unspecified: Secondary | ICD-10-CM | POA: Diagnosis not present

## 2012-03-18 DIAGNOSIS — M9981 Other biomechanical lesions of cervical region: Secondary | ICD-10-CM | POA: Diagnosis not present

## 2012-03-24 DIAGNOSIS — I4891 Unspecified atrial fibrillation: Secondary | ICD-10-CM | POA: Diagnosis not present

## 2012-03-30 DIAGNOSIS — M109 Gout, unspecified: Secondary | ICD-10-CM | POA: Diagnosis not present

## 2012-03-30 DIAGNOSIS — M545 Low back pain: Secondary | ICD-10-CM | POA: Diagnosis not present

## 2012-03-30 DIAGNOSIS — M25569 Pain in unspecified knee: Secondary | ICD-10-CM | POA: Diagnosis not present

## 2012-04-03 DIAGNOSIS — H409 Unspecified glaucoma: Secondary | ICD-10-CM | POA: Diagnosis not present

## 2012-04-03 DIAGNOSIS — H4010X0 Unspecified open-angle glaucoma, stage unspecified: Secondary | ICD-10-CM | POA: Diagnosis not present

## 2012-04-08 DIAGNOSIS — M999 Biomechanical lesion, unspecified: Secondary | ICD-10-CM | POA: Diagnosis not present

## 2012-04-08 DIAGNOSIS — M5030 Other cervical disc degeneration, unspecified cervical region: Secondary | ICD-10-CM | POA: Diagnosis not present

## 2012-04-08 DIAGNOSIS — M9981 Other biomechanical lesions of cervical region: Secondary | ICD-10-CM | POA: Diagnosis not present

## 2012-04-08 DIAGNOSIS — IMO0002 Reserved for concepts with insufficient information to code with codable children: Secondary | ICD-10-CM | POA: Diagnosis not present

## 2012-04-09 DIAGNOSIS — I739 Peripheral vascular disease, unspecified: Secondary | ICD-10-CM | POA: Diagnosis not present

## 2012-04-10 DIAGNOSIS — M999 Biomechanical lesion, unspecified: Secondary | ICD-10-CM | POA: Diagnosis not present

## 2012-04-10 DIAGNOSIS — IMO0002 Reserved for concepts with insufficient information to code with codable children: Secondary | ICD-10-CM | POA: Diagnosis not present

## 2012-04-10 DIAGNOSIS — M9981 Other biomechanical lesions of cervical region: Secondary | ICD-10-CM | POA: Diagnosis not present

## 2012-04-10 DIAGNOSIS — M5030 Other cervical disc degeneration, unspecified cervical region: Secondary | ICD-10-CM | POA: Diagnosis not present

## 2012-04-11 DIAGNOSIS — I4891 Unspecified atrial fibrillation: Secondary | ICD-10-CM | POA: Diagnosis not present

## 2012-04-11 DIAGNOSIS — I495 Sick sinus syndrome: Secondary | ICD-10-CM | POA: Diagnosis not present

## 2012-04-11 DIAGNOSIS — I509 Heart failure, unspecified: Secondary | ICD-10-CM | POA: Diagnosis not present

## 2012-04-13 DIAGNOSIS — M5030 Other cervical disc degeneration, unspecified cervical region: Secondary | ICD-10-CM | POA: Diagnosis not present

## 2012-04-13 DIAGNOSIS — M9981 Other biomechanical lesions of cervical region: Secondary | ICD-10-CM | POA: Diagnosis not present

## 2012-04-13 DIAGNOSIS — IMO0002 Reserved for concepts with insufficient information to code with codable children: Secondary | ICD-10-CM | POA: Diagnosis not present

## 2012-04-13 DIAGNOSIS — M999 Biomechanical lesion, unspecified: Secondary | ICD-10-CM | POA: Diagnosis not present

## 2012-04-17 DIAGNOSIS — M9981 Other biomechanical lesions of cervical region: Secondary | ICD-10-CM | POA: Diagnosis not present

## 2012-04-17 DIAGNOSIS — IMO0002 Reserved for concepts with insufficient information to code with codable children: Secondary | ICD-10-CM | POA: Diagnosis not present

## 2012-04-17 DIAGNOSIS — M999 Biomechanical lesion, unspecified: Secondary | ICD-10-CM | POA: Diagnosis not present

## 2012-04-17 DIAGNOSIS — M5030 Other cervical disc degeneration, unspecified cervical region: Secondary | ICD-10-CM | POA: Diagnosis not present

## 2012-04-21 DIAGNOSIS — I4891 Unspecified atrial fibrillation: Secondary | ICD-10-CM | POA: Diagnosis not present

## 2012-04-22 DIAGNOSIS — M5030 Other cervical disc degeneration, unspecified cervical region: Secondary | ICD-10-CM | POA: Diagnosis not present

## 2012-04-22 DIAGNOSIS — M9981 Other biomechanical lesions of cervical region: Secondary | ICD-10-CM | POA: Diagnosis not present

## 2012-04-22 DIAGNOSIS — M999 Biomechanical lesion, unspecified: Secondary | ICD-10-CM | POA: Diagnosis not present

## 2012-04-22 DIAGNOSIS — IMO0002 Reserved for concepts with insufficient information to code with codable children: Secondary | ICD-10-CM | POA: Diagnosis not present

## 2012-05-22 DIAGNOSIS — I4891 Unspecified atrial fibrillation: Secondary | ICD-10-CM | POA: Diagnosis not present

## 2012-05-25 DIAGNOSIS — I200 Unstable angina: Secondary | ICD-10-CM | POA: Diagnosis not present

## 2012-05-25 DIAGNOSIS — I10 Essential (primary) hypertension: Secondary | ICD-10-CM | POA: Diagnosis not present

## 2012-05-25 DIAGNOSIS — I2510 Atherosclerotic heart disease of native coronary artery without angina pectoris: Secondary | ICD-10-CM | POA: Diagnosis not present

## 2012-05-25 DIAGNOSIS — E782 Mixed hyperlipidemia: Secondary | ICD-10-CM | POA: Diagnosis not present

## 2012-06-03 DIAGNOSIS — H4011X0 Primary open-angle glaucoma, stage unspecified: Secondary | ICD-10-CM | POA: Diagnosis not present

## 2012-06-04 ENCOUNTER — Telehealth (INDEPENDENT_AMBULATORY_CARE_PROVIDER_SITE_OTHER): Payer: Self-pay | Admitting: *Deleted

## 2012-06-04 NOTE — Telephone Encounter (Signed)
Patient's [place] was called @ [phone] Patient's ID [id number]. Per [employer] , representative with [employer], Pantoprazole 40 mg Take 1 by mouth twice a day was approved. The dates are Apr 29 2012 - Apr 29 2013. [place] made aware.

## 2012-06-12 ENCOUNTER — Other Ambulatory Visit: Payer: Self-pay

## 2012-06-12 DIAGNOSIS — L738 Other specified follicular disorders: Secondary | ICD-10-CM | POA: Diagnosis not present

## 2012-06-12 DIAGNOSIS — D0460 Carcinoma in situ of skin of unspecified upper limb, including shoulder: Secondary | ICD-10-CM | POA: Diagnosis not present

## 2012-06-12 DIAGNOSIS — L570 Actinic keratosis: Secondary | ICD-10-CM | POA: Diagnosis not present

## 2012-06-12 DIAGNOSIS — C44621 Squamous cell carcinoma of skin of unspecified upper limb, including shoulder: Secondary | ICD-10-CM | POA: Diagnosis not present

## 2012-06-12 DIAGNOSIS — L821 Other seborrheic keratosis: Secondary | ICD-10-CM | POA: Diagnosis not present

## 2012-06-22 DIAGNOSIS — F329 Major depressive disorder, single episode, unspecified: Secondary | ICD-10-CM | POA: Diagnosis not present

## 2012-06-22 DIAGNOSIS — M109 Gout, unspecified: Secondary | ICD-10-CM | POA: Diagnosis not present

## 2012-06-22 DIAGNOSIS — E039 Hypothyroidism, unspecified: Secondary | ICD-10-CM | POA: Diagnosis not present

## 2012-06-22 DIAGNOSIS — J449 Chronic obstructive pulmonary disease, unspecified: Secondary | ICD-10-CM | POA: Diagnosis not present

## 2012-06-22 DIAGNOSIS — J4489 Other specified chronic obstructive pulmonary disease: Secondary | ICD-10-CM | POA: Diagnosis not present

## 2012-06-22 DIAGNOSIS — I259 Chronic ischemic heart disease, unspecified: Secondary | ICD-10-CM | POA: Diagnosis not present

## 2012-06-25 DIAGNOSIS — E119 Type 2 diabetes mellitus without complications: Secondary | ICD-10-CM | POA: Diagnosis not present

## 2012-06-25 DIAGNOSIS — I70209 Unspecified atherosclerosis of native arteries of extremities, unspecified extremity: Secondary | ICD-10-CM | POA: Diagnosis not present

## 2012-07-01 DIAGNOSIS — M5137 Other intervertebral disc degeneration, lumbosacral region: Secondary | ICD-10-CM | POA: Diagnosis not present

## 2012-07-01 DIAGNOSIS — E291 Testicular hypofunction: Secondary | ICD-10-CM | POA: Diagnosis not present

## 2012-07-01 DIAGNOSIS — IMO0002 Reserved for concepts with insufficient information to code with codable children: Secondary | ICD-10-CM | POA: Diagnosis not present

## 2012-07-01 DIAGNOSIS — M999 Biomechanical lesion, unspecified: Secondary | ICD-10-CM | POA: Diagnosis not present

## 2012-07-13 ENCOUNTER — Encounter (INDEPENDENT_AMBULATORY_CARE_PROVIDER_SITE_OTHER): Payer: Self-pay | Admitting: *Deleted

## 2012-07-13 ENCOUNTER — Other Ambulatory Visit (INDEPENDENT_AMBULATORY_CARE_PROVIDER_SITE_OTHER): Payer: Self-pay | Admitting: *Deleted

## 2012-07-13 ENCOUNTER — Encounter (INDEPENDENT_AMBULATORY_CARE_PROVIDER_SITE_OTHER): Payer: Self-pay | Admitting: Internal Medicine

## 2012-07-13 ENCOUNTER — Ambulatory Visit (INDEPENDENT_AMBULATORY_CARE_PROVIDER_SITE_OTHER): Payer: Medicare Other | Admitting: Internal Medicine

## 2012-07-13 VITALS — BP 120/70 | HR 64 | Temp 97.0°F | Ht 71.0 in | Wt 219.7 lb

## 2012-07-13 DIAGNOSIS — I4891 Unspecified atrial fibrillation: Secondary | ICD-10-CM | POA: Diagnosis not present

## 2012-07-13 DIAGNOSIS — R1310 Dysphagia, unspecified: Secondary | ICD-10-CM

## 2012-07-13 NOTE — Progress Notes (Signed)
Subjective:     Patient ID: Dustin Blankenship, male   DOB: 04-05-1920, 78 y.o.   MRN: [id number]  HPIC/o cough.  He has having dysphagia. He is having trouble swallowing even small bites. Symptoms x 2 yrs which progressively worsened. Occuring frequently. Maintained on Protonix.  Vegetables will lodge in his upper esophagus. Appetite is good. No weight loss. He has a BM about daily with Miralax.    Review of Systems see hpi  Current Outpatient Prescriptions  Medication Sig Dispense Refill  . ALPRAZolam (XANAX) 0.5 MG tablet       . aspirin 81 MG tablet Take 81 mg by mouth daily.        . furosemide (LASIX) 40 MG tablet       . gabapentin (NEURONTIN) 100 MG capsule       . HYDROmorphone (DILAUDID) 4 MG tablet       . levothyroxine (SYNTHROID, LEVOTHROID) 125 MCG tablet       . LUMIGAN 0.03 % ophthalmic solution       . pantoprazole (PROTONIX) 40 MG tablet       . polyethylene glycol powder (GLYCOLAX/MIRALAX) powder       . sotalol (BETAPACE) 80 MG tablet       . Tamsulosin HCl (FLOMAX) 0.4 MG CAPS       . COLCRYS 0.6 MG tablet       . ELESTAT 0.05 % ophthalmic solution       . HYDROcodone-acetaminophen (NORCO) 10-325 MG per tablet       . LASTACAFT 0.25 % SOLN       . predniSONE (DELTASONE) 10 MG tablet       . VOLTAREN 1 % GEL       . warfarin (COUMADIN) 5 MG tablet        Current Outpatient Prescriptions on File Prior to Visit  Medication Sig Dispense Refill  . ALPRAZolam (XANAX) 0.5 MG tablet       . aspirin 81 MG tablet Take 81 mg by mouth daily.        . furosemide (LASIX) 40 MG tablet       . gabapentin (NEURONTIN) 100 MG capsule       . HYDROmorphone (DILAUDID) 4 MG tablet       . levothyroxine (SYNTHROID, LEVOTHROID) 125 MCG tablet       . LUMIGAN 0.03 % ophthalmic solution       . pantoprazole (PROTONIX) 40 MG tablet       . polyethylene glycol powder (GLYCOLAX/MIRALAX) powder       . sotalol (BETAPACE) 80 MG tablet       . Tamsulosin HCl (FLOMAX) 0.4 MG CAPS       .  COLCRYS 0.6 MG tablet       . ELESTAT 0.05 % ophthalmic solution       . HYDROcodone-acetaminophen (NORCO) 10-325 MG per tablet       . LASTACAFT 0.25 % SOLN       . predniSONE (DELTASONE) 10 MG tablet       . VOLTAREN 1 % GEL       . warfarin (COUMADIN) 5 MG tablet        History reviewed. No pertinent past surgical history. No family status information on file.   History   Social History  . Marital Status: Married    Spouse Name: N/A    Number of Children: N/A  . Years of Education: N/A   Occupational History  .  Not on file.   Social History Main Topics  . Smoking status: Never Smoker   . Smokeless tobacco: Not on file  . Alcohol Use: No  . Drug Use: No  . Sexually Active: Not on file   Other Topics Concern  . Not on file   Social History Narrative  . No narrative on file   Allergies  Allergen Reactions  . Penicillins   . Sulfonamide Derivatives         Objective:   Physical Exam Filed Vitals:   07/13/12 1142  Height: 5' 11" (1.803 m)  Weight: 219 lb 11.2 oz (99.655 kg)  Alert and oriented. Skin warm and dry. Oral mucosa is moist.   . Sclera anicteric, conjunctivae is pink. Thyroid not enlarged. No cervical lymphadenopathy. Lungs clear. Heart regular rate and rhythm.  Abdomen is soft. Bowel sounds are positive. No hepatomegaly. No abdominal masses felt. No tenderness.  No edema to lower extremities.        Assessment:    Solid food dysphagia.  Esophageal stricture needs to be ruled out.     Plan:   EGD/ED.

## 2012-07-13 NOTE — Patient Instructions (Addendum)
EGD/ED with Dr. Laural Golden.

## 2012-07-14 ENCOUNTER — Encounter (HOSPITAL_COMMUNITY): Payer: Self-pay | Admitting: Pharmacy Technician

## 2012-07-14 DIAGNOSIS — Z45018 Encounter for adjustment and management of other part of cardiac pacemaker: Secondary | ICD-10-CM | POA: Diagnosis not present

## 2012-07-14 DIAGNOSIS — I4891 Unspecified atrial fibrillation: Secondary | ICD-10-CM | POA: Diagnosis not present

## 2012-07-17 ENCOUNTER — Encounter (HOSPITAL_COMMUNITY)
Admission: RE | Disposition: A | Discharge status: Home / Self Care | Payer: Self-pay | Source: Ambulatory Visit | Attending: Internal Medicine

## 2012-07-17 ENCOUNTER — Ambulatory Visit (HOSPITAL_COMMUNITY)
Admission: RE | Admit: 2012-07-17 | Discharge: 2012-07-17 | Disposition: A | Discharge status: Home / Self Care | Payer: Medicare Other | Source: Ambulatory Visit | Attending: Internal Medicine | Admitting: Internal Medicine

## 2012-07-17 DIAGNOSIS — K219 Gastro-esophageal reflux disease without esophagitis: Secondary | ICD-10-CM

## 2012-07-17 DIAGNOSIS — R1310 Dysphagia, unspecified: Secondary | ICD-10-CM | POA: Insufficient documentation

## 2012-07-17 DIAGNOSIS — K449 Diaphragmatic hernia without obstruction or gangrene: Secondary | ICD-10-CM | POA: Diagnosis not present

## 2012-07-17 DIAGNOSIS — J4489 Other specified chronic obstructive pulmonary disease: Secondary | ICD-10-CM | POA: Insufficient documentation

## 2012-07-17 DIAGNOSIS — J449 Chronic obstructive pulmonary disease, unspecified: Secondary | ICD-10-CM | POA: Insufficient documentation

## 2012-07-17 SURGERY — ESOPHAGOGASTRODUODENOSCOPY (EGD) WITH ESOPHAGEAL DILATION
Anesthesia: Moderate Sedation

## 2012-07-17 MED ORDER — MIDAZOLAM HCL 5 MG/5ML IJ SOLN
INTRAMUSCULAR | Status: AC
  Filled 2012-07-17: qty 5

## 2012-07-17 MED ORDER — MIDAZOLAM HCL 5 MG/5ML IJ SOLN
INTRAMUSCULAR | Status: DC | PRN
  Administered 2012-07-17: 2 mg via INTRAVENOUS

## 2012-07-17 MED ORDER — MEPERIDINE HCL 100 MG/ML IJ SOLN
INTRAMUSCULAR | Status: AC
  Filled 2012-07-17: qty 1

## 2012-07-17 MED ORDER — SODIUM CHLORIDE 0.45 % IV SOLN
Freq: Once | INTRAVENOUS | Status: AC
  Administered 2012-07-17: 1000 mL via INTRAVENOUS

## 2012-07-17 MED ORDER — STERILE WATER FOR IRRIGATION IR SOLN
Status: DC | PRN
  Administered 2012-07-17: 12:00:00

## 2012-07-17 MED ORDER — BUTAMBEN-TETRACAINE-BENZOCAINE 2-2-14 % EX AERO
INHALATION_SPRAY | CUTANEOUS | Status: DC | PRN
  Administered 2012-07-17: 2 via TOPICAL

## 2012-07-17 NOTE — Op Note (Signed)
EGD PROCEDURE REPORT  PATIENT:  Dustin Blankenship  MR#:  [id number] Birthdate:  08-06-1920, 78 y.o., male Endoscopist:  Dr. [name], MD Referred By:  Dr. Jasper Loser. [name], MD Procedure Date: 07/17/2012  Procedure:   EGD with ED.  Indications:  Patient is 78 year old Caucasian male with chronic GERD whose heartburn is well controlled with PPI who presents with dysphagia primarily to solids and at x2 pills. He is undergoing EGD and ED.            Informed Consent:  The risks, benefits, alternatives & imponderables which include, but are not limited to, bleeding, infection, perforation, drug reaction and potential missed lesion have been reviewed.  The potential for biopsy, lesion removal, esophageal dilation, etc. have also been discussed.  Questions have been answered.  All parties agreeable.  Please see history & physical in medical record for more information.  Medications:  Versed 2 mg IV Cetacaine spray topically for oropharyngeal anesthesia  Description of procedure:  The endoscope was introduced through the mouth and advanced to the second portion of the duodenum without difficulty or limitations. The mucosal surfaces were surveyed very carefully during advancement of the scope and upon withdrawal.  Findings:  Esophagus:  Mucosa of the esophagus was normal. GE junction was unremarkable without ring or stricture formation. GEJ:  42 cm Hiatus:  44 cm Stomach:  Stomach was empty and distended very well with insufflation. Folds in the proximal stomach were normal. Examination of mucosa at body, antrum, pyloric channel, angularis fundus and cardia was normal. Duodenum:  Normal bulbar and post bulbar mucosa.  Therapeutic/Diagnostic Maneuvers Performed:  Esophagus dilated by passing 56 French Maloney dilator to full insertion. Esophageal mucosa was examined post dilation and no mucosal disruption noted.  Complications:  None  Impression: Small sliding hiatal hernia otherwise normal  EGD. Esophagus dilated by passing 54 French Maloney dilator no mucosal disruption noted. Therefore suspect esophageal motility disorder.  Recommendations:  Standard instructions given. Patient or his family member to call with progress report in one week.  REHMAN,NAJEEB U  07/17/2012  12:09 PM  CC: Dr. [name], MD & Dr. Rayne Du ref. provider found

## 2012-07-17 NOTE — H&P (Signed)
Dustin Blankenship is an 78 y.o. male.   Chief Complaint: Patient is here for esophagogastroduodenoscopy and esophageal dilation. HPI: Patient is 78 year old Caucasian male who presents with one-year history of dysphagia primarily to solids and at times with pills. He has chronic GERD. He feels his heartburn is well controlled with therapy. He denies, pain or melena. Patient stopped clopidogrel 5 days ago. Aspirin is also on hold.  Past Medical History  Diagnosis Date  . GERD (gastroesophageal reflux disease)   . COPD (chronic obstructive pulmonary disease)   . Glaucoma   . Hypothyroid   . Pacemaker   . Atrial fib/flutter, transient     No past surgical history on file.  No family history on file. Social History:  reports that he has never smoked. He does not have any smokeless tobacco history on file. He reports that he does not drink alcohol or use illicit drugs.  Allergies:  Allergies  Allergen Reactions  . Penicillins Other (See Comments)    Unknown  . Latex Rash  . Sulfonamide Derivatives Rash  . Testosterone Rash    The cream form causes a rash when rubbed on skin.     Medications Prior to Admission  Medication Sig Dispense Refill  . acetaminophen (TYLENOL) 650 MG CR tablet Take 650 mg by mouth 2 (two) times daily.      Marland Kitchen allopurinol (ZYLOPRIM) 300 MG tablet Take 300 mg by mouth daily.      Marland Kitchen ALPRAZolam (XANAX) 0.5 MG tablet Take 0.5 mg by mouth at bedtime as needed. Sleep      . aspirin 325 MG EC tablet Take 325 mg by mouth daily.      . clopidogrel (PLAVIX) 75 MG tablet Take 75 mg by mouth at bedtime.      Marland Kitchen escitalopram (LEXAPRO) 10 MG tablet Take 10 mg by mouth daily.      . furosemide (LASIX) 40 MG tablet Take 40 mg by mouth daily.       Marland Kitchen HYDROcodone-acetaminophen (NORCO/VICODIN) 5-325 MG per tablet Take 1 tablet by mouth 2 (two) times daily.      Marland Kitchen LASTACAFT 0.25 % SOLN Place 1 drop into both eyes daily.       Marland Kitchen levothyroxine (SYNTHROID, LEVOTHROID) 125 MCG tablet  Take 125 mcg by mouth daily.      Marland Kitchen LUMIGAN 0.03 % ophthalmic solution Place 1 drop into both eyes at bedtime.       Marland Kitchen OVER THE COUNTER MEDICATION Take 1 tablet by mouth 2 (two) times daily. Prostate Revive, Herbal Supplement      . pantoprazole (PROTONIX) 40 MG tablet Take 40 mg by mouth 2 (two) times daily.       [name] Glycol-Propyl Glycol (SYSTANE OP) Place 1 drop into both eyes daily.      . polyethylene glycol powder (GLYCOLAX/MIRALAX) powder Take 17 g by mouth daily.       . sotalol (BETAPACE) 80 MG tablet Take 40-80 mg by mouth 2 (two) times daily. Takes 40 mg in the morning and 80 mg in the evening.      . Tamsulosin HCl (FLOMAX) 0.4 MG CAPS 0.4 mg at bedtime.       . nitroGLYCERIN (NITROSTAT) 0.4 MG SL tablet Place 0.4 mg under the tongue every 5 (five) minutes as needed. Chest Pain        No results found for this or any previous visit (from the past 48 hour(s)). No results found.  ROS  Blood pressure 110/78,  pulse 66, temperature 98 F (36.7 C), temperature source Oral, resp. rate 27, SpO2 99.00%. Physical Exam  Constitutional: He appears well-developed and well-nourished.  HENT:  Mouth/Throat: Oropharynx is clear and moist.  Eyes: Conjunctivae are normal. No scleral icterus.  Neck: No thyromegaly present.  Cardiovascular: Normal rate, regular rhythm and normal heart sounds.   No murmur heard. Respiratory: Effort normal and breath sounds normal.  GI: Soft. He exhibits no distension and no mass. There is no tenderness.  Musculoskeletal: He exhibits no edema.  Lymphadenopathy:    He has no cervical adenopathy.  Neurological: He is alert.  Skin: Skin is warm and dry.     Assessment/Plan Solid food dysphagia. Chronic GERD. EGD and ED.  REHMAN,NAJEEB U 07/17/2012, 11:47 AM

## 2012-07-20 DIAGNOSIS — E291 Testicular hypofunction: Secondary | ICD-10-CM | POA: Diagnosis not present

## 2012-07-29 DIAGNOSIS — E291 Testicular hypofunction: Secondary | ICD-10-CM | POA: Diagnosis not present

## 2012-08-12 DIAGNOSIS — E291 Testicular hypofunction: Secondary | ICD-10-CM | POA: Diagnosis not present

## 2012-08-28 DIAGNOSIS — L820 Inflamed seborrheic keratosis: Secondary | ICD-10-CM | POA: Diagnosis not present

## 2012-08-28 DIAGNOSIS — L299 Pruritus, unspecified: Secondary | ICD-10-CM | POA: Diagnosis not present

## 2012-08-28 DIAGNOSIS — D1801 Hemangioma of skin and subcutaneous tissue: Secondary | ICD-10-CM | POA: Diagnosis not present

## 2012-09-22 DIAGNOSIS — Z23 Encounter for immunization: Secondary | ICD-10-CM | POA: Diagnosis not present

## 2012-09-22 DIAGNOSIS — I259 Chronic ischemic heart disease, unspecified: Secondary | ICD-10-CM | POA: Diagnosis not present

## 2012-09-22 DIAGNOSIS — E039 Hypothyroidism, unspecified: Secondary | ICD-10-CM | POA: Diagnosis not present

## 2012-09-22 DIAGNOSIS — J449 Chronic obstructive pulmonary disease, unspecified: Secondary | ICD-10-CM | POA: Diagnosis not present

## 2012-09-22 DIAGNOSIS — M542 Cervicalgia: Secondary | ICD-10-CM | POA: Diagnosis not present

## 2012-09-23 DIAGNOSIS — M999 Biomechanical lesion, unspecified: Secondary | ICD-10-CM | POA: Diagnosis not present

## 2012-09-23 DIAGNOSIS — M9981 Other biomechanical lesions of cervical region: Secondary | ICD-10-CM | POA: Diagnosis not present

## 2012-09-23 DIAGNOSIS — M546 Pain in thoracic spine: Secondary | ICD-10-CM | POA: Diagnosis not present

## 2012-09-23 DIAGNOSIS — M5030 Other cervical disc degeneration, unspecified cervical region: Secondary | ICD-10-CM | POA: Diagnosis not present

## 2012-09-28 DIAGNOSIS — M719 Bursopathy, unspecified: Secondary | ICD-10-CM | POA: Diagnosis not present

## 2012-09-28 DIAGNOSIS — M109 Gout, unspecified: Secondary | ICD-10-CM | POA: Diagnosis not present

## 2012-09-28 DIAGNOSIS — M67919 Unspecified disorder of synovium and tendon, unspecified shoulder: Secondary | ICD-10-CM | POA: Diagnosis not present

## 2012-09-28 DIAGNOSIS — M4800 Spinal stenosis, site unspecified: Secondary | ICD-10-CM | POA: Diagnosis not present

## 2012-09-28 DIAGNOSIS — Z79899 Other long term (current) drug therapy: Secondary | ICD-10-CM | POA: Diagnosis not present

## 2012-09-30 DIAGNOSIS — M109 Gout, unspecified: Secondary | ICD-10-CM | POA: Diagnosis not present

## 2012-09-30 DIAGNOSIS — Z79899 Other long term (current) drug therapy: Secondary | ICD-10-CM | POA: Diagnosis not present

## 2012-10-07 DIAGNOSIS — M5030 Other cervical disc degeneration, unspecified cervical region: Secondary | ICD-10-CM | POA: Diagnosis not present

## 2012-10-07 DIAGNOSIS — M999 Biomechanical lesion, unspecified: Secondary | ICD-10-CM | POA: Diagnosis not present

## 2012-10-07 DIAGNOSIS — M9981 Other biomechanical lesions of cervical region: Secondary | ICD-10-CM | POA: Diagnosis not present

## 2012-10-07 DIAGNOSIS — M546 Pain in thoracic spine: Secondary | ICD-10-CM | POA: Diagnosis not present

## 2012-10-09 DIAGNOSIS — M999 Biomechanical lesion, unspecified: Secondary | ICD-10-CM | POA: Diagnosis not present

## 2012-10-09 DIAGNOSIS — M9981 Other biomechanical lesions of cervical region: Secondary | ICD-10-CM | POA: Diagnosis not present

## 2012-10-09 DIAGNOSIS — M5030 Other cervical disc degeneration, unspecified cervical region: Secondary | ICD-10-CM | POA: Diagnosis not present

## 2012-10-09 DIAGNOSIS — M546 Pain in thoracic spine: Secondary | ICD-10-CM | POA: Diagnosis not present

## 2012-10-12 DIAGNOSIS — I70209 Unspecified atherosclerosis of native arteries of extremities, unspecified extremity: Secondary | ICD-10-CM | POA: Diagnosis not present

## 2012-10-14 DIAGNOSIS — I2510 Atherosclerotic heart disease of native coronary artery without angina pectoris: Secondary | ICD-10-CM | POA: Diagnosis not present

## 2012-10-14 DIAGNOSIS — I495 Sick sinus syndrome: Secondary | ICD-10-CM | POA: Diagnosis not present

## 2012-10-14 DIAGNOSIS — I4891 Unspecified atrial fibrillation: Secondary | ICD-10-CM | POA: Diagnosis not present

## 2012-10-14 DIAGNOSIS — Z45018 Encounter for adjustment and management of other part of cardiac pacemaker: Secondary | ICD-10-CM | POA: Diagnosis not present

## 2012-10-29 ENCOUNTER — Other Ambulatory Visit: Payer: Self-pay

## 2012-10-29 DIAGNOSIS — L919 Hypertrophic disorder of the skin, unspecified: Secondary | ICD-10-CM | POA: Diagnosis not present

## 2012-10-29 DIAGNOSIS — L299 Pruritus, unspecified: Secondary | ICD-10-CM | POA: Diagnosis not present

## 2012-10-29 DIAGNOSIS — H61009 Unspecified perichondritis of external ear, unspecified ear: Secondary | ICD-10-CM | POA: Diagnosis not present

## 2012-10-29 DIAGNOSIS — D485 Neoplasm of uncertain behavior of skin: Secondary | ICD-10-CM | POA: Diagnosis not present

## 2012-10-29 DIAGNOSIS — L219 Seborrheic dermatitis, unspecified: Secondary | ICD-10-CM | POA: Diagnosis not present

## 2012-11-18 DIAGNOSIS — M545 Low back pain: Secondary | ICD-10-CM | POA: Diagnosis not present

## 2012-12-31 DIAGNOSIS — I739 Peripheral vascular disease, unspecified: Secondary | ICD-10-CM | POA: Diagnosis not present

## 2013-01-09 DIAGNOSIS — I495 Sick sinus syndrome: Secondary | ICD-10-CM | POA: Diagnosis not present

## 2013-01-09 DIAGNOSIS — I4891 Unspecified atrial fibrillation: Secondary | ICD-10-CM | POA: Diagnosis not present

## 2013-01-15 DIAGNOSIS — H409 Unspecified glaucoma: Secondary | ICD-10-CM | POA: Diagnosis not present

## 2013-01-15 DIAGNOSIS — H4010X0 Unspecified open-angle glaucoma, stage unspecified: Secondary | ICD-10-CM | POA: Diagnosis not present

## 2013-02-22 DIAGNOSIS — Z85828 Personal history of other malignant neoplasm of skin: Secondary | ICD-10-CM | POA: Diagnosis not present

## 2013-03-11 DIAGNOSIS — I739 Peripheral vascular disease, unspecified: Secondary | ICD-10-CM | POA: Diagnosis not present

## 2013-03-12 DIAGNOSIS — M5030 Other cervical disc degeneration, unspecified cervical region: Secondary | ICD-10-CM | POA: Diagnosis not present

## 2013-03-12 DIAGNOSIS — M546 Pain in thoracic spine: Secondary | ICD-10-CM | POA: Diagnosis not present

## 2013-03-12 DIAGNOSIS — M999 Biomechanical lesion, unspecified: Secondary | ICD-10-CM | POA: Diagnosis not present

## 2013-03-12 DIAGNOSIS — M9981 Other biomechanical lesions of cervical region: Secondary | ICD-10-CM | POA: Diagnosis not present

## 2013-03-15 DIAGNOSIS — M546 Pain in thoracic spine: Secondary | ICD-10-CM | POA: Diagnosis not present

## 2013-03-15 DIAGNOSIS — M9981 Other biomechanical lesions of cervical region: Secondary | ICD-10-CM | POA: Diagnosis not present

## 2013-03-15 DIAGNOSIS — M999 Biomechanical lesion, unspecified: Secondary | ICD-10-CM | POA: Diagnosis not present

## 2013-03-15 DIAGNOSIS — M5030 Other cervical disc degeneration, unspecified cervical region: Secondary | ICD-10-CM | POA: Diagnosis not present

## 2013-03-17 DIAGNOSIS — M9981 Other biomechanical lesions of cervical region: Secondary | ICD-10-CM | POA: Diagnosis not present

## 2013-03-17 DIAGNOSIS — M999 Biomechanical lesion, unspecified: Secondary | ICD-10-CM | POA: Diagnosis not present

## 2013-03-17 DIAGNOSIS — M546 Pain in thoracic spine: Secondary | ICD-10-CM | POA: Diagnosis not present

## 2013-03-17 DIAGNOSIS — M5030 Other cervical disc degeneration, unspecified cervical region: Secondary | ICD-10-CM | POA: Diagnosis not present

## 2013-03-19 DIAGNOSIS — M999 Biomechanical lesion, unspecified: Secondary | ICD-10-CM | POA: Diagnosis not present

## 2013-03-19 DIAGNOSIS — M9981 Other biomechanical lesions of cervical region: Secondary | ICD-10-CM | POA: Diagnosis not present

## 2013-03-19 DIAGNOSIS — M5030 Other cervical disc degeneration, unspecified cervical region: Secondary | ICD-10-CM | POA: Diagnosis not present

## 2013-03-19 DIAGNOSIS — M546 Pain in thoracic spine: Secondary | ICD-10-CM | POA: Diagnosis not present

## 2013-03-24 DIAGNOSIS — Z45018 Encounter for adjustment and management of other part of cardiac pacemaker: Secondary | ICD-10-CM | POA: Diagnosis not present

## 2013-03-24 DIAGNOSIS — I4891 Unspecified atrial fibrillation: Secondary | ICD-10-CM | POA: Diagnosis not present

## 2013-03-24 DIAGNOSIS — J449 Chronic obstructive pulmonary disease, unspecified: Secondary | ICD-10-CM | POA: Diagnosis not present

## 2013-03-26 ENCOUNTER — Other Ambulatory Visit (INDEPENDENT_AMBULATORY_CARE_PROVIDER_SITE_OTHER): Payer: Self-pay | Admitting: Internal Medicine

## 2013-04-10 DIAGNOSIS — I495 Sick sinus syndrome: Secondary | ICD-10-CM | POA: Diagnosis not present

## 2013-04-10 DIAGNOSIS — I4891 Unspecified atrial fibrillation: Secondary | ICD-10-CM | POA: Diagnosis not present

## 2013-04-10 LAB — PACEMAKER DEVICE OBSERVATION

## 2013-05-14 DIAGNOSIS — R0602 Shortness of breath: Secondary | ICD-10-CM | POA: Diagnosis not present

## 2013-05-14 DIAGNOSIS — J449 Chronic obstructive pulmonary disease, unspecified: Secondary | ICD-10-CM | POA: Diagnosis not present

## 2013-05-14 DIAGNOSIS — I509 Heart failure, unspecified: Secondary | ICD-10-CM | POA: Diagnosis not present

## 2013-05-17 DIAGNOSIS — J4489 Other specified chronic obstructive pulmonary disease: Secondary | ICD-10-CM | POA: Diagnosis not present

## 2013-05-17 DIAGNOSIS — R5381 Other malaise: Secondary | ICD-10-CM | POA: Diagnosis not present

## 2013-05-17 DIAGNOSIS — R5383 Other fatigue: Secondary | ICD-10-CM | POA: Diagnosis not present

## 2013-05-17 DIAGNOSIS — I259 Chronic ischemic heart disease, unspecified: Secondary | ICD-10-CM | POA: Diagnosis not present

## 2013-05-17 DIAGNOSIS — E039 Hypothyroidism, unspecified: Secondary | ICD-10-CM | POA: Diagnosis not present

## 2013-05-17 DIAGNOSIS — R351 Nocturia: Secondary | ICD-10-CM | POA: Diagnosis not present

## 2013-05-17 DIAGNOSIS — F411 Generalized anxiety disorder: Secondary | ICD-10-CM | POA: Diagnosis not present

## 2013-05-17 DIAGNOSIS — J449 Chronic obstructive pulmonary disease, unspecified: Secondary | ICD-10-CM | POA: Diagnosis not present

## 2013-06-03 ENCOUNTER — Encounter: Payer: Self-pay | Admitting: Cardiovascular Disease

## 2013-06-28 DIAGNOSIS — J449 Chronic obstructive pulmonary disease, unspecified: Secondary | ICD-10-CM | POA: Diagnosis not present

## 2013-06-28 DIAGNOSIS — N400 Enlarged prostate without lower urinary tract symptoms: Secondary | ICD-10-CM | POA: Diagnosis not present

## 2013-06-28 DIAGNOSIS — I259 Chronic ischemic heart disease, unspecified: Secondary | ICD-10-CM | POA: Diagnosis not present

## 2013-06-28 DIAGNOSIS — E039 Hypothyroidism, unspecified: Secondary | ICD-10-CM | POA: Diagnosis not present

## 2013-07-10 ENCOUNTER — Other Ambulatory Visit: Payer: Self-pay | Admitting: Cardiovascular Disease

## 2013-07-10 DIAGNOSIS — I495 Sick sinus syndrome: Secondary | ICD-10-CM | POA: Diagnosis not present

## 2013-07-10 DIAGNOSIS — I4891 Unspecified atrial fibrillation: Secondary | ICD-10-CM

## 2013-07-19 ENCOUNTER — Telehealth (INDEPENDENT_AMBULATORY_CARE_PROVIDER_SITE_OTHER): Payer: Self-pay | Admitting: *Deleted

## 2013-07-19 NOTE — Telephone Encounter (Signed)
I called the [employer] to obtain a prior authorization for Pantoprazole 40 mg . Take one by mouth twice a day. This was approved with the following dates being given.05/19/13 -07/19/14. I called the [facility] and made [facility] aware of this.

## 2013-07-20 DIAGNOSIS — Z961 Presence of intraocular lens: Secondary | ICD-10-CM | POA: Diagnosis not present

## 2013-07-20 DIAGNOSIS — H4010X0 Unspecified open-angle glaucoma, stage unspecified: Secondary | ICD-10-CM | POA: Diagnosis not present

## 2013-07-20 DIAGNOSIS — H409 Unspecified glaucoma: Secondary | ICD-10-CM | POA: Diagnosis not present

## 2013-07-20 DIAGNOSIS — H18419 Arcus senilis, unspecified eye: Secondary | ICD-10-CM | POA: Diagnosis not present

## 2013-07-23 ENCOUNTER — Other Ambulatory Visit: Payer: Self-pay | Admitting: *Deleted

## 2013-07-23 MED ORDER — CLOPIDOGREL BISULFATE 75 MG PO TABS: 75.0000 mg | ORAL_TABLET | Freq: Every day | ORAL | Status: DC

## 2013-07-23 NOTE — Telephone Encounter (Signed)
Clopidogrel refilled x 6

## 2013-07-26 DIAGNOSIS — J209 Acute bronchitis, unspecified: Secondary | ICD-10-CM | POA: Diagnosis not present

## 2013-07-26 DIAGNOSIS — I4891 Unspecified atrial fibrillation: Secondary | ICD-10-CM | POA: Diagnosis not present

## 2013-08-05 ENCOUNTER — Encounter: Payer: Self-pay | Admitting: *Deleted

## 2013-08-05 LAB — REMOTE PACEMAKER DEVICE
AL AMPLITUDE: 3 mv
BAMS-0001: 180 {beats}/min
BRDY-0002RV: 70 {beats}/min
DEVICE MODEL PM: 7182542
RV LEAD AMPLITUDE: 12 mv
RV LEAD IMPEDENCE PM: 610 Ohm
RV LEAD THRESHOLD: 0.5 V

## 2013-09-01 DIAGNOSIS — Z87448 Personal history of other diseases of urinary system: Secondary | ICD-10-CM | POA: Diagnosis not present

## 2013-09-01 DIAGNOSIS — N400 Enlarged prostate without lower urinary tract symptoms: Secondary | ICD-10-CM | POA: Diagnosis not present

## 2013-09-01 DIAGNOSIS — R39198 Other difficulties with micturition: Secondary | ICD-10-CM | POA: Diagnosis not present

## 2013-09-01 DIAGNOSIS — R339 Retention of urine, unspecified: Secondary | ICD-10-CM | POA: Diagnosis not present

## 2013-09-12 ENCOUNTER — Encounter: Payer: Self-pay | Admitting: *Deleted

## 2013-09-15 ENCOUNTER — Other Ambulatory Visit (INDEPENDENT_AMBULATORY_CARE_PROVIDER_SITE_OTHER): Payer: Self-pay | Admitting: *Deleted

## 2013-09-15 NOTE — Telephone Encounter (Signed)
Rec'd a refill request for the [facility] for a refill on Pantoprazole. Rx was written 03/26/13 and for 5 reilss. The last refill was 08/07/13.

## 2013-09-16 MED ORDER — PANTOPRAZOLE SODIUM 40 MG PO TBEC: DELAYED_RELEASE_TABLET | ORAL | Status: DC

## 2013-09-17 ENCOUNTER — Ambulatory Visit (INDEPENDENT_AMBULATORY_CARE_PROVIDER_SITE_OTHER): Payer: Medicare Other | Admitting: Cardiovascular Disease

## 2013-09-17 ENCOUNTER — Encounter: Payer: Self-pay | Admitting: Cardiovascular Disease

## 2013-09-17 ENCOUNTER — Other Ambulatory Visit: Payer: Self-pay | Admitting: Cardiovascular Disease

## 2013-09-17 VITALS — BP 140/80 | HR 70 | Ht 70.0 in | Wt 209.0 lb

## 2013-09-17 DIAGNOSIS — I4891 Unspecified atrial fibrillation: Secondary | ICD-10-CM | POA: Diagnosis not present

## 2013-09-17 DIAGNOSIS — Z950 Presence of cardiac pacemaker: Secondary | ICD-10-CM

## 2013-09-17 DIAGNOSIS — I279 Pulmonary heart disease, unspecified: Secondary | ICD-10-CM | POA: Diagnosis not present

## 2013-09-17 DIAGNOSIS — I2781 Cor pulmonale (chronic): Secondary | ICD-10-CM

## 2013-09-17 DIAGNOSIS — I2510 Atherosclerotic heart disease of native coronary artery without angina pectoris: Secondary | ICD-10-CM | POA: Diagnosis not present

## 2013-09-17 LAB — PACEMAKER DEVICE OBSERVATION
AL AMPLITUDE: 2.7 mv
AL IMPEDENCE PM: 430 Ohm
AL THRESHOLD: 1 V
BAMS-0001: 180 {beats}/min
BATTERY VOLTAGE: 2.98 V
RV LEAD AMPLITUDE: 12 mv

## 2013-09-17 NOTE — Patient Instructions (Addendum)
Your next pacemaker transmission will be due on 12-20-2012, and follow up with Dr.Croitoru in 6 months

## 2013-09-17 NOTE — Progress Notes (Signed)
In office pacemaker interrogation. Normal device function. Minimal changes made this session.

## 2013-09-18 DIAGNOSIS — I2781 Cor pulmonale (chronic): Secondary | ICD-10-CM | POA: Insufficient documentation

## 2013-09-18 DIAGNOSIS — Z950 Presence of cardiac pacemaker: Secondary | ICD-10-CM | POA: Insufficient documentation

## 2013-09-18 DIAGNOSIS — I2510 Atherosclerotic heart disease of native coronary artery without angina pectoris: Secondary | ICD-10-CM | POA: Insufficient documentation

## 2013-09-18 NOTE — Assessment & Plan Note (Addendum)
In June of 2012 Dustin Blankenship had an abnormal [employer] that showed infarct/scar with peri-infarct ischemia in the inferolateral distribution. He did not want to have invasive angiography. Subsequently, he did have symptoms consistent with possible unstable angina but again requested noninvasive therapy. At that point we stopped his warfarin and he has been on aspirin and clopidogrel ever since. He has not been troubled with chest pain except once in the last 3 months and the episode was promptly relieved with nitroglycerin. He again reaffirms his desire for noninvasive management.

## 2013-09-18 NOTE — Assessment & Plan Note (Signed)
History of previous esophagoplasty with Dr. Laural Golden

## 2013-09-18 NOTE — Assessment & Plan Note (Signed)
Felt to be prone to falls and injury , he is on aspirin and clopidogrel as a substitute for what would otherwise be appropriate warfarin anticoagulation. His episodes of atrial fibrillation are quite infrequent his pacemaker is programmed to detect and notify us about prolonged episodes of atrial mode switch. The risk benefit of stroke prevention with full anticoagulation versus risk of fall and intracranial bleeding should be periodically reassessed, depending on the burden of atrial fibrillation. Well tolerated sotalol therapy with a QTC of 455 ms. ECG monitoring should be performed at least twice yearly as should evaluation of renal function.

## 2013-09-18 NOTE — Assessment & Plan Note (Signed)
Dustin Blankenship has mild pulmonary hypertension and well compensated right heart failure related to COPD. He requires oxygen supplementation, although he has had some difficulty with with insurance companies and durable medical equipment providers due to some bureaucratic issues.

## 2013-09-18 NOTE — Assessment & Plan Note (Signed)
Comprehensive in office check shows normal function. Mode switch burden was less than 1% with the longest episode being only 16 seconds in duration. Most of the episodes are actually competitive sensor driven pacing rather than true arrhythmia. No permanent reprogramming changes were made. He is encouraged to perform remote pacemaker monitoring even though he prefers in office checks. This will allow suspect atrial fibrillation sooner.

## 2013-09-18 NOTE — Progress Notes (Signed)
Patient ID: Dustin Blankenship, male   DOB: 1920/04/23, 79 y.o.   MRN: [id number]     Reason for office visit Paroxysmal atrial fibrillation, CAD, cor pulmonale  Dustin Blankenship is here for routine followup and pacemaker check. He is under some emotional distress as his wife Dustin Blankenship (also used to be my patient) had a stroke Monday. She has had long-standing problems with atrial fibrillation and documented left atrial spontaneous echo contrast, but was off anticoagulants due to recurrent problems with gastrointestinal bleeding. She apparently is recovering from her stroke fairly well and is now at the [facility].  From a physical standpoint Dustin Blankenship is doing well. He has not had any worsening of his chronic shortness of breath. He has oxygen requiring COPD. He has not had any recent chest pain. He recalls having to take one sublingual nitroglycerin about 3 months ago, but does not recall the circumstances. We have been aware for many years that he has inferolateral left ventricular ischemia, but he has preferred a noninvasive management.  His pacemaker is functioning normally and has not had any recent episodes of paroxysmal atrial fibrillation, while on treatment with sotalol. To my knowledge, AF has not been documented since 2011. He is no longer on warfarin and takes aspirin and Plavix.    Allergies  Allergen Reactions  . Multaq [Dronedarone]   . Penicillins Other (See Comments)    Unknown  . Latex Rash  . Sulfonamide Derivatives Rash  . Testosterone Rash    The cream form causes a rash when rubbed on skin.     Current Outpatient Prescriptions  Medication Sig Dispense Refill  . acetaminophen (TYLENOL) 650 MG CR tablet Take 650 mg by mouth 2 (two) times daily.      Marland Kitchen allopurinol (ZYLOPRIM) 300 MG tablet Take 300 mg by mouth daily.      Marland Kitchen ALPRAZolam (XANAX) 0.5 MG tablet Take 0.5 mg by mouth at bedtime as needed. Sleep      . aspirin 325 MG EC tablet Take 325 mg by mouth daily.        . clopidogrel (PLAVIX) 75 MG tablet Take 1 tablet (75 mg total) by mouth at bedtime.  30 tablet  6  . escitalopram (LEXAPRO) 10 MG tablet Take 10 mg by mouth daily.      . furosemide (LASIX) 40 MG tablet Take 40 mg by mouth daily as needed.       Marland Kitchen HYDROcodone-acetaminophen (NORCO/VICODIN) 5-325 MG per tablet Take 1 tablet by mouth 2 (two) times daily.      Marland Kitchen LASTACAFT 0.25 % SOLN Place 1 drop into both eyes daily.       Marland Kitchen levothyroxine (SYNTHROID, LEVOTHROID) 125 MCG tablet Take 125 mcg by mouth daily.      Marland Kitchen LUMIGAN 0.03 % ophthalmic solution Place 1 drop into both eyes at bedtime.       . nitroGLYCERIN (NITROSTAT) 0.4 MG SL tablet Place 0.4 mg under the tongue every 5 (five) minutes as needed. Chest Pain      . OVER THE COUNTER MEDICATION Take 1 tablet by mouth 2 (two) times daily. Prostate Revive, Herbal Supplement      . pantoprazole (PROTONIX) 40 MG tablet TAKE (1) TABLET BY MOUTH TWICE DAILY.  60 tablet  5  . Polyethyl Glycol-Propyl Glycol (SYSTANE OP) Place 1 drop into both eyes daily.      . polyethylene glycol powder (GLYCOLAX/MIRALAX) powder Take 17 g by mouth daily.       Marland Kitchen  sotalol (BETAPACE) 80 MG tablet Take 40-80 mg by mouth 2 (two) times daily. Takes 40 mg in the morning and 80 mg in the evening.      . Tamsulosin HCl (FLOMAX) 0.4 MG CAPS 0.4 mg at bedtime.        No current facility-administered medications for this visit.    Past Medical History  Diagnosis Date  . GERD (gastroesophageal reflux disease)   . COPD (chronic obstructive pulmonary disease)   . Glaucoma   . Hypothyroid   . Pacemaker 09/20/2010     ST Jude Accent DR RF device  MODEL #[id number],SERIAL #[id number]  . Glaucoma   . Pulmonary HTN     DUE TO CHRONIC HYPOXIA   FROM COPD  . CHF (congestive heart failure)     right heart failure and pulm htn due to chronic hypoxia from COPD  . Atrial fib/flutter, transient     not on warfarin due to risk of falls ,on aspirin for prophylaxis  . Sinus node dysfunction      Dual-chamber permanent pacermaker ST JUDE    Past Surgical History  Procedure Laterality Date  . Doppler echocardiography  07/25/2011    EF =50-55%  . Nm myocar perf wall motion  07/25/2011  . Insert / replace / remove pacemaker  09/20/2010    ST JUDE ACCENT-dual chamber ;      No family history on file.  History   Social History  . Marital Status: Married    Spouse Name: N/A    Number of Children: N/A  . Years of Education: N/A   Occupational History  . Not on file.   Social History Main Topics  . Smoking status: Never Smoker   . Smokeless tobacco: Not on file  . Alcohol Use: No  . Drug Use: No  . Sexual Activity: Not on file   Other Topics Concern  . Not on file   Social History Narrative  . No narrative on file    Review of systems: The patient specifically denies any chest pain at rest or with exertion, orthopnea, paroxysmal nocturnal dyspnea, syncope, palpitations, focal neurological deficits, intermittent claudication, lower extremity edema, unexplained weight gain, cough, hemoptysis or wheezing.  The patient also denies abdominal pain, nausea, vomiting, dysphagia, diarrhea, constipation, polyuria, polydipsia, dysuria, hematuria, frequency, urgency, abnormal bleeding or bruising, fever, chills, unexpected weight changes, mood swings, change in skin or hair texture, change in voice quality, auditory or visual problems, allergic reactions or rashes, new musculoskeletal complaints other than usual "aches and pains".   PHYSICAL EXAM BP 140/80  Pulse 70  Ht _0  (1.778 m)  Wt 209 lb (94.802 kg)  BMI 29.99 kg/m2  General: Alert, oriented x3, no distress Head: no evidence of trauma, PERRL, EOMI, no exophtalmos or lid lag, no myxedema, no xanthelasma; normal ears, nose and oropharynx Neck: normal jugular venous pulsations and no hepatojugular reflux; brisk carotid pulses without delay and no carotid bruits Chest: clear to auscultation with somewhat distant  breath sounds, no signs of consolidation by percussion or palpation, normal fremitus, symmetrical and full respiratory excursions, healthy pacemaker site Cardiovascular: normal position and quality of the apical impulse, regular rhythm, normal first and second heart sounds, no murmurs, rubs or gallops Abdomen: no tenderness or distention, no masses by palpation, no abnormal pulsatility or arterial bruits, normal bowel sounds, no hepatosplenomegaly Extremities: no clubbing, cyanosis or edema; 2+ radial, ulnar and brachial pulses bilaterally; 2+ right femoral, posterior tibial and dorsalis pedis pulses; 2+ left femoral,  posterior tibial and dorsalis pedis pulses; no subclavian or femoral bruits Neurological: grossly nonfocal   EKG: Atrial paced ventricular sensed, QTC 455 ms secondary to sotalol therapy, otherwise normal  Lipid Panel     Component Value Date/Time   CHOL  Value: 107        ATP III CLASSIFICATION:  <200     mg/dL   Desirable  200-239  mg/dL   Borderline High  >=240    mg/dL   High        06/04/2010 0522   TRIG 107 06/04/2010 0522   HDL 31* 06/04/2010 0522   CHOLHDL 3.5 06/04/2010 0522   VLDL 21 06/04/2010 0522   LDLCALC  Value: 55        Total Cholesterol/HDL:CHD Risk Coronary Heart Disease Risk Table                     Men   Women  1/2 Average Risk   3.4   3.3  Average Risk       5.0   4.4  2 X Average Risk   9.6   7.1  3 X Average Risk  23.4   11.0        Use the calculated Patient Ratio above and the CHD Risk Table to determine the patient's CHD Risk.        ATP III CLASSIFICATION (LDL):  <100     mg/dL   Optimal  100-129  mg/dL   Near or Above                    Optimal  130-159  mg/dL   Borderline  160-189  mg/dL   High  >190     mg/dL   Very High 06/04/2010 0522    BMET    Component Value Date/Time   NA 135 05/16/2011 0554   K 4.5 05/16/2011 0554   CL 95* 05/16/2011 0554   CO2 36* 05/16/2011 0554   GLUCOSE 91 05/16/2011 0554   BUN 31* 05/16/2011 0554   CREATININE 1.08 05/16/2011  0554   CALCIUM 10.1 05/16/2011 0554   GFRNONAA >60 05/16/2011 0554   GFRAA  Value: >60        The eGFR has been calculated using the MDRD equation. This calculation has not been validated in all clinical situations. eGFR's persistently <60 mL/min signify possible Chronic Kidney Disease. 05/16/2011 0554     ASSESSMENT AND PLAN Atrial fibrillation Felt to be prone to falls and injury , he is on aspirin and clopidogrel as a substitute for what would otherwise be appropriate warfarin anticoagulation. His episodes of atrial fibrillation are quite infrequent his pacemaker is programmed to detect and notify us about prolonged episodes of atrial mode switch. The risk benefit of stroke prevention with full anticoagulation versus risk of fall and intracranial bleeding should be periodically reassessed, depending on the burden of atrial fibrillation. Well tolerated sotalol therapy with a QTC of 455 ms. ECG monitoring should be performed at least twice yearly as should evaluation of renal function.  Pacemaker - St Jude dual chamber RF device, implanted October 2011 Comprehensive in office check shows normal function. Mode switch burden was less than 1% with the longest episode being only 16 seconds in duration. Most of the episodes are actually competitive sensor driven pacing rather than true arrhythmia. No permanent reprogramming changes were made. He is encouraged to perform remote pacemaker monitoring even though he prefers in office checks. This will allow suspect  atrial fibrillation sooner.  Cor pulmonale, chronic Dustin Blankenship has mild pulmonary hypertension and well compensated right heart failure related to COPD. He requires oxygen supplementation, although he has had some difficulty with with insurance companies and durable medical equipment providers due to some bureaucratic issues.  Knee joint effusion History of previous esophagoplasty with Dr. Laural Golden  CAD (coronary artery disease) - suspected based on  abnormal nuclear stress test In June of 2012 Taggert had an abnormal Lexiscan Myoview that showed infarct/scar with peri-infarct ischemia in the inferolateral distribution. He did not want to have invasive angiography. Subsequently, he did have symptoms consistent with possible unstable angina but again requested noninvasive therapy. At that point we stopped his warfarin and he has been on aspirin and clopidogrel ever since. He has not been troubled with chest pain except once in the last 3 months and the episode was promptly relieved with nitroglycerin. He again reaffirms his desire for noninvasive management.  Orders Placed This Encounter  Procedures  . EKG 12-Lead   No orders of the defined types were placed in this encounter.    Dustin Humbles, MD, [facility] [phone] office [phone] pager

## 2013-09-20 ENCOUNTER — Encounter: Payer: Self-pay | Admitting: Cardiovascular Disease

## 2013-10-11 ENCOUNTER — Encounter: Payer: Self-pay | Admitting: Cardiovascular Disease

## 2013-10-14 DIAGNOSIS — I70209 Unspecified atherosclerosis of native arteries of extremities, unspecified extremity: Secondary | ICD-10-CM | POA: Diagnosis not present

## 2013-11-09 DIAGNOSIS — Z79899 Other long term (current) drug therapy: Secondary | ICD-10-CM | POA: Diagnosis not present

## 2013-11-09 DIAGNOSIS — M67919 Unspecified disorder of synovium and tendon, unspecified shoulder: Secondary | ICD-10-CM | POA: Diagnosis not present

## 2013-11-09 DIAGNOSIS — M064 Inflammatory polyarthropathy: Secondary | ICD-10-CM | POA: Diagnosis not present

## 2013-11-09 DIAGNOSIS — M545 Low back pain: Secondary | ICD-10-CM | POA: Diagnosis not present

## 2013-11-17 DIAGNOSIS — Z23 Encounter for immunization: Secondary | ICD-10-CM | POA: Diagnosis not present

## 2013-11-18 DIAGNOSIS — H1810 Bullous keratopathy, unspecified eye: Secondary | ICD-10-CM | POA: Diagnosis not present

## 2013-11-22 DIAGNOSIS — J449 Chronic obstructive pulmonary disease, unspecified: Secondary | ICD-10-CM | POA: Diagnosis not present

## 2013-11-22 DIAGNOSIS — M1990 Unspecified osteoarthritis, unspecified site: Secondary | ICD-10-CM | POA: Diagnosis not present

## 2013-11-22 DIAGNOSIS — E039 Hypothyroidism, unspecified: Secondary | ICD-10-CM | POA: Diagnosis not present

## 2013-11-22 DIAGNOSIS — I259 Chronic ischemic heart disease, unspecified: Secondary | ICD-10-CM | POA: Diagnosis not present

## 2013-11-24 ENCOUNTER — Telehealth: Payer: Self-pay | Admitting: Cardiovascular Disease

## 2013-11-24 NOTE — Telephone Encounter (Signed)
Please call-pt wants to get rid of his oxygen.

## 2013-11-24 NOTE — Telephone Encounter (Signed)
Patient does not want the O2 in his home anymore.  Has not used in over a months.  [facility] needs an order faxed over to [phone] to D/C.  Will review w/Dr. C Friday and fax an order if he agrees.

## 2013-11-24 NOTE — Telephone Encounter (Signed)
Please send order for DC O2

## 2013-11-25 NOTE — Telephone Encounter (Signed)
Order sent to D/C home O2

## 2013-11-30 DIAGNOSIS — H409 Unspecified glaucoma: Secondary | ICD-10-CM | POA: Diagnosis not present

## 2013-11-30 DIAGNOSIS — H4010X0 Unspecified open-angle glaucoma, stage unspecified: Secondary | ICD-10-CM | POA: Diagnosis not present

## 2013-12-02 DIAGNOSIS — I10 Essential (primary) hypertension: Secondary | ICD-10-CM | POA: Diagnosis not present

## 2013-12-07 DIAGNOSIS — M19049 Primary osteoarthritis, unspecified hand: Secondary | ICD-10-CM | POA: Diagnosis not present

## 2013-12-07 DIAGNOSIS — M67919 Unspecified disorder of synovium and tendon, unspecified shoulder: Secondary | ICD-10-CM | POA: Diagnosis not present

## 2013-12-07 DIAGNOSIS — R6889 Other general symptoms and signs: Secondary | ICD-10-CM | POA: Diagnosis not present

## 2013-12-07 DIAGNOSIS — M545 Low back pain: Secondary | ICD-10-CM | POA: Diagnosis not present

## 2013-12-19 LAB — PACEMAKER DEVICE OBSERVATION

## 2013-12-19 LAB — MDC_IDC_ENUM_SESS_TYPE_REMOTE
Battery Remaining Longevity: 70 mo
Brady Statistic AP VP Percent: 1.7 %
Brady Statistic AP VS Percent: 85 %
Brady Statistic AS VP Percent: 1.2 %
Brady Statistic AS VS Percent: 11 %
Brady Statistic RV Percent Paced: 2.9 %
Date Time Interrogation Session: 20150104163335
Implantable Pulse Generator Model: 2210
Lead Channel Impedance Value: 450 Ohm
Lead Channel Impedance Value: 600 Ohm
Lead Channel Sensing Intrinsic Amplitude: 12 mV
Lead Channel Sensing Intrinsic Amplitude: 2.1 mV
Lead Channel Setting Pacing Amplitude: 0.75 V
Lead Channel Setting Sensing Sensitivity: 2 mV
MDC IDC MSMT BATTERY VOLTAGE: 2.93 V
MDC IDC PG SERIAL: 7183444
MDC IDC SET LEADCHNL RA PACING AMPLITUDE: 2 V
MDC IDC SET LEADCHNL RV PACING PULSEWIDTH: 0.5 ms
MDC IDC STAT BRADY RA PERCENT PACED: 82 %

## 2013-12-20 ENCOUNTER — Ambulatory Visit (INDEPENDENT_AMBULATORY_CARE_PROVIDER_SITE_OTHER): Payer: Medicare Other | Admitting: *Deleted

## 2013-12-20 DIAGNOSIS — I4891 Unspecified atrial fibrillation: Secondary | ICD-10-CM

## 2014-01-04 DIAGNOSIS — R634 Abnormal weight loss: Secondary | ICD-10-CM | POA: Diagnosis not present

## 2014-01-04 DIAGNOSIS — M1990 Unspecified osteoarthritis, unspecified site: Secondary | ICD-10-CM | POA: Diagnosis not present

## 2014-01-04 DIAGNOSIS — R413 Other amnesia: Secondary | ICD-10-CM | POA: Diagnosis not present

## 2014-01-04 DIAGNOSIS — I10 Essential (primary) hypertension: Secondary | ICD-10-CM | POA: Diagnosis not present

## 2014-01-04 DIAGNOSIS — F329 Major depressive disorder, single episode, unspecified: Secondary | ICD-10-CM | POA: Diagnosis not present

## 2014-01-04 DIAGNOSIS — F3289 Other specified depressive episodes: Secondary | ICD-10-CM | POA: Diagnosis not present

## 2014-01-04 DIAGNOSIS — E039 Hypothyroidism, unspecified: Secondary | ICD-10-CM | POA: Diagnosis not present

## 2014-01-25 DIAGNOSIS — H4010X0 Unspecified open-angle glaucoma, stage unspecified: Secondary | ICD-10-CM | POA: Diagnosis not present

## 2014-01-25 DIAGNOSIS — H18419 Arcus senilis, unspecified eye: Secondary | ICD-10-CM | POA: Diagnosis not present

## 2014-01-25 DIAGNOSIS — H409 Unspecified glaucoma: Secondary | ICD-10-CM | POA: Diagnosis not present

## 2014-01-25 DIAGNOSIS — Z961 Presence of intraocular lens: Secondary | ICD-10-CM | POA: Diagnosis not present

## 2014-02-11 DIAGNOSIS — M5030 Other cervical disc degeneration, unspecified cervical region: Secondary | ICD-10-CM | POA: Diagnosis not present

## 2014-02-11 DIAGNOSIS — M999 Biomechanical lesion, unspecified: Secondary | ICD-10-CM | POA: Diagnosis not present

## 2014-02-11 DIAGNOSIS — M546 Pain in thoracic spine: Secondary | ICD-10-CM | POA: Diagnosis not present

## 2014-02-11 DIAGNOSIS — M9981 Other biomechanical lesions of cervical region: Secondary | ICD-10-CM | POA: Diagnosis not present

## 2014-02-14 DIAGNOSIS — M999 Biomechanical lesion, unspecified: Secondary | ICD-10-CM | POA: Diagnosis not present

## 2014-02-14 DIAGNOSIS — M546 Pain in thoracic spine: Secondary | ICD-10-CM | POA: Diagnosis not present

## 2014-02-14 DIAGNOSIS — M9981 Other biomechanical lesions of cervical region: Secondary | ICD-10-CM | POA: Diagnosis not present

## 2014-02-14 DIAGNOSIS — M5030 Other cervical disc degeneration, unspecified cervical region: Secondary | ICD-10-CM | POA: Diagnosis not present

## 2014-02-24 DIAGNOSIS — I70209 Unspecified atherosclerosis of native arteries of extremities, unspecified extremity: Secondary | ICD-10-CM | POA: Diagnosis not present

## 2014-03-02 ENCOUNTER — Other Ambulatory Visit: Payer: Self-pay | Admitting: Cardiovascular Disease

## 2014-03-02 NOTE — Telephone Encounter (Signed)
Rx was sent to pharmacy electronically.

## 2014-03-04 DIAGNOSIS — I259 Chronic ischemic heart disease, unspecified: Secondary | ICD-10-CM | POA: Diagnosis not present

## 2014-03-04 DIAGNOSIS — E039 Hypothyroidism, unspecified: Secondary | ICD-10-CM | POA: Diagnosis not present

## 2014-03-04 DIAGNOSIS — M545 Low back pain, unspecified: Secondary | ICD-10-CM | POA: Diagnosis not present

## 2014-03-04 DIAGNOSIS — J449 Chronic obstructive pulmonary disease, unspecified: Secondary | ICD-10-CM | POA: Diagnosis not present

## 2014-03-14 ENCOUNTER — Encounter: Payer: Self-pay | Admitting: Diagnostic Neuroimaging

## 2014-03-14 ENCOUNTER — Encounter (INDEPENDENT_AMBULATORY_CARE_PROVIDER_SITE_OTHER): Payer: Self-pay

## 2014-03-14 ENCOUNTER — Ambulatory Visit (INDEPENDENT_AMBULATORY_CARE_PROVIDER_SITE_OTHER): Payer: Medicare Other | Admitting: Diagnostic Neuroimaging

## 2014-03-14 VITALS — BP 126/75 | HR 70 | Temp 97.6°F | Ht 69.75 in | Wt 204.5 lb

## 2014-03-14 DIAGNOSIS — R413 Other amnesia: Secondary | ICD-10-CM | POA: Diagnosis not present

## 2014-03-14 DIAGNOSIS — G3184 Mild cognitive impairment, so stated: Secondary | ICD-10-CM | POA: Diagnosis not present

## 2014-03-14 NOTE — Patient Instructions (Signed)
Continue current medications. 

## 2014-03-14 NOTE — Progress Notes (Signed)
[facility]  PATIENT: Dustin Blankenship DOB: June 04, 1920  REFERRING CLINICIAN: [name], E HISTORY FROM: patient and care-giver Dustin Blankenship) REASON FOR VISIT: new consult   HISTORICAL  CHIEF COMPLAINT:  No chief complaint on file.   HISTORY OF PRESENT ILLNESS:   80 year old male here for evaluation dementia.  Patient carefully describes his entire past medical history, surgeries and conditions to May he also mentions severe life stressors including his wife having a stroke in October 2014 and some family difficulties related to his son and power of attorney assignment. Patient states that these stressors have led to his short-term memory problems. Patient does not think he has severe memory issues. Patient caregiver is here and has noticed a slowly progressive problem with short-term memory issues, confusion, paranoia, behavior changes. In her opinion symptoms were gradually progressive prior to the patient's wife's stroke. Since that time there is more segment decline. Patient has not rebounded since that time. Patient is on donepezil currently without significant benefit. Patient feels some subjective memory improvement on donepezil.   Patient was at home with assistance from caregiver. Patient's wife are planning to move to assisted-living next month.  REVIEW OF SYSTEMS: Full 14 system review of systems performed and notable only for insomnia difficulty swallowing tremor anxiety numbness or decreased energy memory loss headache easy bruising joint pain muscle allergy runny nose urinary problems incontinence shortness of breath cough blurred vision loss of vision fatigue cellulite hearing loss trouble swallowing itching.  ALLERGIES: Allergies  Allergen Reactions  . Multaq [Dronedarone]   . Penicillins Other (See Comments)    Unknown  . Latex Rash  . Sulfonamide Derivatives Rash  . Testosterone Rash    The cream form causes a rash when rubbed on skin.     HOME  MEDICATIONS: Outpatient Prescriptions Prior to Visit  Medication Sig Dispense Refill  . allopurinol (ZYLOPRIM) 300 MG tablet Take 300 mg by mouth daily.      Marland Kitchen ALPRAZolam (XANAX) 0.5 MG tablet Take 0.5 mg by mouth at bedtime as needed. Sleep      . aspirin 325 MG EC tablet Take 325 mg by mouth daily.      . clopidogrel (PLAVIX) 75 MG tablet TAKE ONE TABLET BY MOUTH ONCE DAILY.  30 tablet  7  . escitalopram (LEXAPRO) 10 MG tablet Take 10 mg by mouth daily.      . furosemide (LASIX) 40 MG tablet Take 40 mg by mouth daily as needed.       Marland Kitchen HYDROcodone-acetaminophen (NORCO/VICODIN) 5-325 MG per tablet Take 1 tablet by mouth 2 (two) times daily.      Marland Kitchen LASTACAFT 0.25 % SOLN Place 1 drop into both eyes daily.       Marland Kitchen levothyroxine (SYNTHROID, LEVOTHROID) 125 MCG tablet Take 125 mcg by mouth daily.      Marland Kitchen LUMIGAN 0.03 % ophthalmic solution Place 1 drop into both eyes at bedtime.       . nitroGLYCERIN (NITROSTAT) 0.4 MG SL tablet Place 0.4 mg under the tongue every 5 (five) minutes as needed. Chest Pain      . OVER THE COUNTER MEDICATION Take 1 tablet by mouth 2 (two) times daily. Prostate Revive, Herbal Supplement      . pantoprazole (PROTONIX) 40 MG tablet TAKE (1) TABLET BY MOUTH TWICE DAILY.  60 tablet  5  . Polyethyl Glycol-Propyl Glycol (SYSTANE OP) Place 1 drop into both eyes daily.      . polyethylene glycol powder (GLYCOLAX/MIRALAX) powder Take  17 g by mouth daily.       . sotalol (BETAPACE) 80 MG tablet Take 40-80 mg by mouth 2 (two) times daily. Takes 40 mg in the morning and 80 mg in the evening.      Marland Kitchen acetaminophen (TYLENOL) 650 MG CR tablet Take 650 mg by mouth 2 (two) times daily.      . Tamsulosin HCl (FLOMAX) 0.4 MG CAPS 0.4 mg at bedtime.        No facility-administered medications prior to visit.    PAST MEDICAL HISTORY: Past Medical History  Diagnosis Date  . GERD (gastroesophageal reflux disease)   . COPD (chronic obstructive pulmonary disease)   . Glaucoma   .  Hypothyroid   . Pacemaker 09/20/2010     ST Jude Accent DR RF device  MODEL #[id number],SERIAL #[id number]  . Glaucoma   . Pulmonary HTN     DUE TO CHRONIC HYPOXIA   FROM COPD  . CHF (congestive heart failure)     right heart failure and pulm htn due to chronic hypoxia from COPD  . Atrial fib/flutter, transient     not on warfarin due to risk of falls ,on aspirin for prophylaxis  . Sinus node dysfunction     Dual-chamber permanent pacermaker ST JUDE  . Gout     PAST SURGICAL HISTORY: Past Surgical History  Procedure Laterality Date  . Doppler echocardiography  07/25/2011    EF =50-55%  . Nm myocar perf wall motion  07/25/2011  . Insert / replace / remove pacemaker  09/20/2010    ST JUDE ACCENT-dual chamber ;    . Lung biopsy    . Colonoscopy w/ polypectomy      FAMILY HISTORY: History reviewed. No pertinent family history.  SOCIAL HISTORY:  History   Social History  . Marital Status: Married    Spouse Name: Michiel Cowboy    Number of Children: 3  . Years of Education: College   Occupational History  . Retired    Social History Main Topics  . Smoking status: Former Smoker -- 3.00 packs/day for 18 years    Types: Cigarettes    Quit date: 12/16/1953  . Smokeless tobacco: Never Used  . Alcohol Use: No  . Drug Use: No  . Sexual Activity: Not on file   Other Topics Concern  . Not on file   Social History Narrative   Patient lives at home with spouse currently. Plans to move to [facility] in [place] on  April 17th, 2015.    Caffeine Use: 1 cup of coffee in a.m; 1 cup of black tea in evenings daily     PHYSICAL EXAM  Filed Vitals:   03/14/14 1029  BP: 126/75  Pulse: 70  Temp: 97.6 F (36.4 C)  TempSrc: Oral  Height: 5' 9.75" (1.772 m)  Weight: 204 lb 8 oz (92.761 kg)    Not recorded    Body mass index is 29.54 kg/(m^2).  GENERAL EXAM: Patient is in no distress; well developed, nourished and groomed; neck is supple; elderly, O2 via nasal canula, hoarse  voice.  CARDIOVASCULAR: Regular rate and rhythm, no murmurs, no carotid bruits  NEUROLOGIC: MENTAL STATUS: awake, alert, oriented to person, place and time; REGISTERS 3/3; RECALLS 0/3. MISSES 1 FOR INSTRUCTIONS. Normal attention and concentration, language fluent, comprehension intact, naming intact, fund of knowledge appropriate; MMSE 26/30. NO FRONTAL RELEASE SIGNS. CRANIAL NERVE: DIFFICULT TO VISUALIZE FUNDI; RIGHT PUPIL NO REACTION. EFT PUPIL MIN RXN. visual fields full to confrontation, extraocular  muscles intact, no nystagmus, facial sensation and strength symmetric, hearing intact, palate elevates symmetrically, uvula midline, shoulder shrug symmetric, tongue midline. MOTOR: normal bulk and tone, full strength in the BUE, BLE SENSORY: normal and symmetric to light touch COORDINATION: finger-nose-finger, fine finger movements normal REFLEXES: deep tendon reflexes trace and symmetric GAIT/STATION: narrow based gait; CAUTIOUS, USES ROLLING WALKER.    DIAGNOSTIC DATA (LABS, IMAGING, TESTING) - I reviewed patient records, labs, notes, testing and imaging myself where available.  Lab Results  Component Value Date   WBC 15.0* 05/16/2011   HGB 12.6* 05/16/2011   HCT 39.6 05/16/2011   MCV 91.2 05/16/2011   PLT 307 05/16/2011      Component Value Date/Time   NA 135 05/16/2011 0554   K 4.5 05/16/2011 0554   CL 95* 05/16/2011 0554   CO2 36* 05/16/2011 0554   GLUCOSE 91 05/16/2011 0554   BUN 31* 05/16/2011 0554   CREATININE 1.08 05/16/2011 0554   CALCIUM 10.1 05/16/2011 0554   PROT 5.8* 10/19/2010 0345   ALBUMIN 3.1* 10/19/2010 0345   AST 15 10/19/2010 0345   ALT 15 10/19/2010 0345   ALKPHOS 39 10/19/2010 0345   BILITOT 0.5 10/19/2010 0345   GFRNONAA >60 05/16/2011 0554   GFRAA  Value: >60        The eGFR has been calculated using the MDRD equation. This calculation has not been validated in all clinical situations. eGFR's persistently <60 mL/min signify possible Chronic Kidney Disease. 05/16/2011  0554   Lab Results  Component Value Date   CHOL  Value: 107        ATP III CLASSIFICATION:  <200     mg/dL   Desirable  200-239  mg/dL   Borderline High  >=240    mg/dL   High        06/04/2010   HDL 31* 06/04/2010   LDLCALC  Value: 55        Total Cholesterol/HDL:CHD Risk Coronary Heart Disease Risk Table                     Men   Women  1/2 Average Risk   3.4   3.3  Average Risk       5.0   4.4  2 X Average Risk   9.6   7.1  3 X Average Risk  23.4   11.0        Use the calculated Patient Ratio above and the CHD Risk Table to determine the patient's CHD Risk.        ATP III CLASSIFICATION (LDL):  <100     mg/dL   Optimal  100-129  mg/dL   Near or Above                    Optimal  130-159  mg/dL   Borderline  160-189  mg/dL   High  >190     mg/dL   Very High 06/04/2010   TRIG 107 06/04/2010   CHOLHDL 3.5 06/04/2010   Lab Results  Component Value Date   HGBA1C  Value: 6.6 (NOTE)  According to the ADA Clinical Practice Recommendations for 2011, when HbA1c is used as a screening test:   >=6.5%   Diagnostic of Diabetes Mellitus           (if abnormal result  is confirmed)  5.7-6.4%   Increased risk of developing Diabetes Mellitus  References:Diagnosis and Classification of Diabetes Mellitus,Diabetes JPET,6244,69(FQHKU 1):S62-S69 and Standards of Medical Care in         Diabetes - 2011,Diabetes VJDY,5183,35  (Suppl 1):S11-S61.* 10/19/2010   No results found for this basename: [id number]   Lab Results  Component Value Date   TSH 1.274 10/18/2010      ASSESSMENT AND PLAN  80 y.o. year old male here with  progressive short-term memory problems. May represent mild cognitive impairment versus early dementia. Symptoms stable/slightly progressing.  Ddx: MCI vs mild dementia (alzheimer's)  PLAN: - Continue donepezil - No further testing or treatment advised given advanced age, multiple co-morbid medical issues and polypharmacy. Patient  agrees with plan.  Meds ordered this encounter  Medications  . donepezil (ARICEPT) 10 MG tablet    Sig: Take 1 tablet by mouth daily.   Return if symptoms worsen or fail to improve, for return to PCP.   Penni Bombard, MD 0/31/2811, 88:67 AM Certified in Neurology, Neurophysiology and Neuroimaging  [facility] [address], [place],  [address] [phone]

## 2014-03-17 ENCOUNTER — Encounter: Payer: Medicare Other | Admitting: Cardiovascular Disease

## 2014-03-18 ENCOUNTER — Encounter: Payer: Medicare Other | Admitting: Cardiovascular Disease

## 2014-04-22 ENCOUNTER — Telehealth: Payer: Self-pay | Admitting: Cardiovascular Disease

## 2014-04-22 NOTE — Telephone Encounter (Signed)
Please call.

## 2014-04-26 ENCOUNTER — Encounter: Payer: Self-pay | Admitting: Cardiovascular Disease

## 2014-04-26 ENCOUNTER — Ambulatory Visit (INDEPENDENT_AMBULATORY_CARE_PROVIDER_SITE_OTHER): Payer: Medicare Other | Admitting: Cardiovascular Disease

## 2014-04-26 VITALS — BP 102/60 | HR 74 | Resp 16 | Ht 70.0 in | Wt 195.7 lb

## 2014-04-26 DIAGNOSIS — Z950 Presence of cardiac pacemaker: Secondary | ICD-10-CM

## 2014-04-26 DIAGNOSIS — I2510 Atherosclerotic heart disease of native coronary artery without angina pectoris: Secondary | ICD-10-CM

## 2014-04-26 DIAGNOSIS — I4891 Unspecified atrial fibrillation: Secondary | ICD-10-CM

## 2014-04-26 LAB — PACEMAKER DEVICE OBSERVATION

## 2014-04-26 NOTE — Patient Instructions (Signed)
Remote monitoring is used to monitor your pacemaker from home. This monitoring reduces the number of office visits required to check your device to one time per year. It allows [place] to keep an eye on the functioning of your device to ensure it is working properly. You are scheduled for a device check from home on 07-28-2014. You may send your transmission at any time that day. If you have a wireless device, the transmission will be sent automatically. After your physician reviews your transmission, you will receive a postcard with your next transmission date.  Your physician recommends that you schedule a follow-up appointment in: 6 months with Dr.Croitoru

## 2014-04-26 NOTE — Telephone Encounter (Signed)
Saw pt on 5-12. Cell adaptor given. Merlin transmitter reordered.

## 2014-04-30 LAB — MDC_IDC_ENUM_SESS_TYPE_INCLINIC
Battery Remaining Longevity: 6.9
Battery Remaining Percentage: 81 %
Brady Statistic RV Percent Paced: 3.4 %
Implantable Pulse Generator Model: 2210
Implantable Pulse Generator Serial Number: 7182542
Lead Channel Impedance Value: 550 Ohm
Lead Channel Pacing Threshold Amplitude: 0.5 V
Lead Channel Pacing Threshold Pulse Width: 0.5 ms
Lead Channel Sensing Intrinsic Amplitude: 12 mV
Lead Channel Setting Pacing Amplitude: 2 V
Lead Channel Setting Pacing Pulse Width: 0.5 ms
MDC IDC MSMT BATTERY VOLTAGE: 2.93 V
MDC IDC MSMT LEADCHNL RA IMPEDANCE VALUE: 430 Ohm
MDC IDC MSMT LEADCHNL RA PACING THRESHOLD AMPLITUDE: 1 V
MDC IDC MSMT LEADCHNL RA PACING THRESHOLD PULSEWIDTH: 0.8 ms
MDC IDC MSMT LEADCHNL RA SENSING INTR AMPL: 3.2 mV
MDC IDC SET LEADCHNL RV PACING AMPLITUDE: 0.75 V
MDC IDC SET LEADCHNL RV SENSING SENSITIVITY: 2 mV
MDC IDC STAT BRADY RA PERCENT PACED: 85 %

## 2014-05-01 ENCOUNTER — Encounter: Payer: Self-pay | Admitting: Cardiovascular Disease

## 2014-05-01 NOTE — Progress Notes (Signed)
Patient ID: Dustin Blankenship, male   DOB: [birth year], 80 y.o.   MRN: [id number]      Reason for office visit Pacemaker followup, chronic cor pulmonale, atrial fibrillation, CAD  Dustin Blankenship is now a resident at [facility]. After his wife as above had a stroke they both moved into this facility. He seems to be content with his new living conditions and pleased about the care that his wife is receiving. He does not have angina pectoris. He has occasional swelling in his right ankle and occasional dizziness. History of cancer breast is unchanged.  Interrogation of his pacemaker shows normal device function. There is a 5% atrial pacing and 2% ventricular pacing. He has frequent episodes of brief paroxysmal atrial tachycardia but no atrial fibrillation. His presenting rhythm today's atrial paced ventricular sensed with premature atrial contractions. He ECG shows a QTC of 450 ms on treatment with sotalol.  He has a long-standing history of paroxysmal atrial tachycardia and paroxysmal atrial fibrillation as well as sinus node dysfunction for which she has a pacemaker. He is on chronic treatment with sotalol (40 mg in a.m. 80 mg in p.m.). This seemed to do a good job of controlling his symptoms of arrhythmia and there has not been documentation of true atrial fibrillation about 4 years, although atrial tachycardia occurs relatively frequently. His risk of bleeding is felt to be high and he no longer takes warfarin, but is on aspirin and clopidogrel. His nuclear stress test shows perfusion abnormalities consistent with coronary artery disease (for lateral reversible ischemia) but he is minimally symptomatic and prefers noninvasive management.Marland Kitchen He is on chronic oxygen therapy for COPD.  Allergies  Allergen Reactions  . Multaq [Dronedarone]   . Penicillins Other (See Comments)    Unknown  . Latex Rash  . Sulfonamide Derivatives Rash  . Testosterone Rash    The cream form causes a rash when rubbed  on skin.     Current Outpatient Prescriptions  Medication Sig Dispense Refill  . allopurinol (ZYLOPRIM) 300 MG tablet Take 300 mg by mouth daily.      Marland Kitchen ALPRAZolam (XANAX) 0.5 MG tablet Take 0.5 mg by mouth at bedtime as needed. Sleep      . aspirin 325 MG EC tablet Take 325 mg by mouth daily.      . clopidogrel (PLAVIX) 75 MG tablet TAKE ONE TABLET BY MOUTH ONCE DAILY.  30 tablet  7  . donepezil (ARICEPT) 10 MG tablet Take 1 tablet by mouth daily.      Marland Kitchen escitalopram (LEXAPRO) 10 MG tablet Take 10 mg by mouth daily.      . furosemide (LASIX) 40 MG tablet Take 40 mg by mouth daily.       Marland Kitchen HYDROcodone-acetaminophen (NORCO/VICODIN) 5-325 MG per tablet Take 1 tablet by mouth 2 (two) times daily.      Marland Kitchen LASTACAFT 0.25 % SOLN Place 1 drop into both eyes daily.       Marland Kitchen levothyroxine (SYNTHROID, LEVOTHROID) 125 MCG tablet Take 125 mcg by mouth daily.      Marland Kitchen LUMIGAN 0.03 % ophthalmic solution Place 1 drop into both eyes at bedtime.       . nitroGLYCERIN (NITROSTAT) 0.4 MG SL tablet Place 0.4 mg under the tongue every 5 (five) minutes as needed. Chest Pain      . OVER THE COUNTER MEDICATION Take 1 tablet by mouth 2 (two) times daily. Prostate Revive, Herbal Supplement      . Polyethyl Glycol-Propyl Glycol (  SYSTANE OP) Place 1 drop into both eyes daily.      . polyethylene glycol powder (GLYCOLAX/MIRALAX) powder Take 17 g by mouth daily.       . sotalol (BETAPACE) 80 MG tablet Take 40-80 mg by mouth 2 (two) times daily. Takes 40 mg in the morning and 80 mg in the evening.       No current facility-administered medications for this visit.    Past Medical History  Diagnosis Date  . GERD (gastroesophageal reflux disease)   . COPD (chronic obstructive pulmonary disease)   . Glaucoma   . Hypothyroid   . Pacemaker 09/20/2010     ST Jude Accent DR RF device  MODEL #PN2210,SERIAL #[id number]  . Glaucoma   . Pulmonary HTN     DUE TO CHRONIC HYPOXIA   FROM COPD  . CHF (congestive heart failure)      right heart failure and pulm htn due to chronic hypoxia from COPD  . Atrial fib/flutter, transient     not on warfarin due to risk of falls ,on aspirin for prophylaxis  . Sinus node dysfunction     Dual-chamber permanent pacermaker ST JUDE  . Gout     Past Surgical History  Procedure Laterality Date  . Doppler echocardiography  07/25/2011    EF =50-55%  . Nm myocar perf wall motion  07/25/2011  . Insert / replace / remove pacemaker  09/20/2010    ST JUDE ACCENT-dual chamber ;    . Lung biopsy    . Colonoscopy w/ polypectomy      No family history on file.  History   Social History  . Marital Status: Married    Spouse Name: Michiel Cowboy    Number of Children: 3  . Years of Education: College   Occupational History  . Retired    Social History Main Topics  . Smoking status: Former Smoker -- 3.00 packs/day for 18 years    Types: Cigarettes    Quit date: 12/16/1953  . Smokeless tobacco: Never Used  . Alcohol Use: No  . Drug Use: No  . Sexual Activity: Not on file   Other Topics Concern  . Not on file   Social History Narrative   Patient lives at home with spouse currently. Plans to move to [facility] in [place] on  April 17th, 2015.    Caffeine Use: 1 cup of coffee in a.m; 1 cup of black tea in evenings daily    Review of systems: Shortness of breath on oxygen therapy, mild swelling of the right ankle, mild dizziness, short-term memory problems. The patient specifically denies any chest pain at rest or with exertion, dyspnea at rest , orthopnea, paroxysmal nocturnal dyspnea, syncope, palpitations, focal neurological deficits, intermittent claudication, lower extremity edema, unexplained weight gain, cough, hemoptysis or wheezing.  The patient also denies abdominal pain, nausea, vomiting, dysphagia, diarrhea, constipation, polyuria, polydipsia, dysuria, hematuria, frequency, urgency, abnormal bleeding or bruising, fever, chills, unexpected weight changes, mood swings,  change in skin or hair texture, change in voice quality, auditory or visual problems, allergic reactions or rashes, new musculoskeletal complaints other than usual "aches and pains".   PHYSICAL EXAM BP 102/60  Pulse 74  Resp 16  Ht _0  (1.778 m)  Wt 195 lb 11.2 oz (88.769 kg)  BMI 28.08 kg/m2 General: Alert, oriented x3, no distress  Head: no evidence of trauma, PERRL, EOMI, no exophtalmos or lid lag, no myxedema, no xanthelasma; normal ears, nose and oropharynx  Neck: normal jugular  venous pulsations and no hepatojugular reflux; brisk carotid pulses without delay and no carotid bruits  Chest: clear to auscultation with somewhat distant breath sounds, no signs of consolidation by percussion or palpation, normal fremitus, symmetrical and full respiratory excursions, healthy pacemaker site  Cardiovascular: normal position and quality of the apical impulse, regular rhythm, normal first and second heart sounds, no murmurs, rubs or gallops  Abdomen: no tenderness or distention, no masses by palpation, no abnormal pulsatility or arterial bruits, normal bowel sounds, no hepatosplenomegaly  Extremities: no clubbing, cyanosis or edema; 2+ radial, ulnar and brachial pulses bilaterally; 2+ right femoral, posterior tibial and dorsalis pedis pulses; 2+ left femoral, posterior tibial and dorsalis pedis pulses; no subclavian or femoral bruits  Neurological: grossly nonfocal   EKG: Atrial paced ventricular sensed, QTC 450 ms secondary to sotalol therapy, otherwise normal  Lipid Panel     Component Value Date/Time   CHOL  Value: 107        ATP III CLASSIFICATION:  <200     mg/dL   Desirable  200-239  mg/dL   Borderline High  >=240    mg/dL   High        06/04/2010 0522   TRIG 107 06/04/2010 0522   HDL 31* 06/04/2010 0522   CHOLHDL 3.5 06/04/2010 0522   VLDL 21 06/04/2010 0522   LDLCALC  Value: 55        Total Cholesterol/HDL:CHD Risk Coronary Heart Disease Risk Table                     Men   Women  1/2  Average Risk   3.4   3.3  Average Risk       5.0   4.4  2 X Average Risk   9.6   7.1  3 X Average Risk  23.4   11.0        Use the calculated Patient Ratio above and the CHD Risk Table to determine the patient's CHD Risk.        ATP III CLASSIFICATION (LDL):  <100     mg/dL   Optimal  100-129  mg/dL   Near or Above                    Optimal  130-159  mg/dL   Borderline  160-189  mg/dL   High  >190     mg/dL   Very High 06/04/2010 0522    BMET    Component Value Date/Time   NA 135 05/16/2011 0554   K 4.5 05/16/2011 0554   CL 95* 05/16/2011 0554   CO2 36* 05/16/2011 0554   GLUCOSE 91 05/16/2011 0554   BUN 31* 05/16/2011 0554   CREATININE 1.08 05/16/2011 0554   CALCIUM 10.1 05/16/2011 0554   GFRNONAA >60 05/16/2011 0554   GFRAA  Value: >60        The eGFR has been calculated using the MDRD equation. This calculation has not been validated in all clinical situations. eGFR's persistently <60 mL/min signify possible Chronic Kidney Disease. 05/16/2011 0554     ASSESSMENT AND PLAN Pacemaker - St Jude dual chamber RF device, implanted October 2011 Device function, continue remote monitoring using the Merlin system every 3 months and now seen back in the office in 6 months for monitoring of sotalol therapy.  CAD (coronary artery disease) - suspected based on abnormal nuclear stress test Asymptomatic inferolateral ischemia seen on stress test  Atrial fibrillation None documented in a  long time on treatment with sotalol. The dose of sotalol was decreased due to complaints of orthostatic dizziness, but still has good arrhythmia control. His pacemaker is programmed to detect and notify us about prolonged episodes of mode switch. Need to have his renal function monitored at least every 6 months.   Patient Instructions  Remote monitoring is used to monitor your pacemaker from home. This monitoring reduces the number of office visits required to check your device to one time per year. It allows [place] to keep an eye  on the functioning of your device to ensure it is working properly. You are scheduled for a device check from home on 07-28-2014. You may send your transmission at any time that day. If you have a wireless device, the transmission will be sent automatically. After your physician reviews your transmission, you will receive a postcard with your next transmission date.  Your physician recommends that you schedule a follow-up appointment in: 6 months with Dr.Deissy Guilbert      Orders Placed This Encounter  Procedures  . Implantable device check  . EKG 12-Lead   No orders of the defined types were placed in this encounter.    Tecora Eustache  Sanda Klein, MD, [facility] [phone] office [phone] pager

## 2014-05-01 NOTE — Assessment & Plan Note (Signed)
None documented in a long time on treatment with sotalol. The dose of sotalol was decreased due to complaints of orthostatic dizziness, but still has good arrhythmia control. His pacemaker is programmed to detect and notify us about prolonged episodes of mode switch. Need to have his renal function monitored at least every 6 months.

## 2014-05-01 NOTE — Assessment & Plan Note (Signed)
Device function, continue remote monitoring using the Merlin system every 3 months and now seen back in the office in 6 months for monitoring of sotalol therapy.

## 2014-05-01 NOTE — Assessment & Plan Note (Signed)
Asymptomatic inferolateral ischemia seen on stress test

## 2014-05-02 ENCOUNTER — Telehealth: Payer: Self-pay | Admitting: *Deleted

## 2014-05-02 DIAGNOSIS — Z961 Presence of intraocular lens: Secondary | ICD-10-CM | POA: Diagnosis not present

## 2014-05-02 DIAGNOSIS — H18419 Arcus senilis, unspecified eye: Secondary | ICD-10-CM | POA: Diagnosis not present

## 2014-05-02 DIAGNOSIS — Z79899 Other long term (current) drug therapy: Secondary | ICD-10-CM

## 2014-05-02 DIAGNOSIS — H4010X0 Unspecified open-angle glaucoma, stage unspecified: Secondary | ICD-10-CM | POA: Diagnosis not present

## 2014-05-02 DIAGNOSIS — H35429 Microcystoid degeneration of retina, unspecified eye: Secondary | ICD-10-CM | POA: Diagnosis not present

## 2014-05-02 NOTE — Telephone Encounter (Signed)
Patient notified to get a BMP done this week.  Voiced understanding and will go to the [facility] in [place].

## 2014-05-02 NOTE — Telephone Encounter (Signed)
Message copied by Tressa Busman on Mon May 02, 2014  8:31 AM ------      Message from: Sanda Klein      Created: Sun May 01, 2014  1:37 PM       Please check and see when he last had a metabolic panel. Need a BUN and creatinine and potassium every 6 months      [facility] ------

## 2014-05-03 DIAGNOSIS — Z79899 Other long term (current) drug therapy: Secondary | ICD-10-CM | POA: Diagnosis not present

## 2014-05-03 LAB — BASIC METABOLIC PANEL
BUN: 26 mg/dL — ABNORMAL HIGH (ref 6–23)
CO2: 28 mEq/L (ref 19–32)
Calcium: 9.2 mg/dL (ref 8.4–10.5)
Chloride: 102 mEq/L (ref 96–112)
Creat: 1.02 mg/dL (ref 0.50–1.35)
Glucose, Bld: 88 mg/dL (ref 70–99)
Potassium: 3.9 mEq/L (ref 3.5–5.3)
SODIUM: 140 meq/L (ref 135–145)

## 2014-05-04 DIAGNOSIS — E039 Hypothyroidism, unspecified: Secondary | ICD-10-CM | POA: Diagnosis not present

## 2014-05-04 DIAGNOSIS — N400 Enlarged prostate without lower urinary tract symptoms: Secondary | ICD-10-CM | POA: Diagnosis not present

## 2014-05-04 DIAGNOSIS — I259 Chronic ischemic heart disease, unspecified: Secondary | ICD-10-CM | POA: Diagnosis not present

## 2014-05-04 DIAGNOSIS — M1990 Unspecified osteoarthritis, unspecified site: Secondary | ICD-10-CM | POA: Diagnosis not present

## 2014-05-05 DIAGNOSIS — E1149 Type 2 diabetes mellitus with other diabetic neurological complication: Secondary | ICD-10-CM | POA: Diagnosis not present

## 2014-05-05 DIAGNOSIS — I70209 Unspecified atherosclerosis of native arteries of extremities, unspecified extremity: Secondary | ICD-10-CM | POA: Diagnosis not present

## 2014-05-06 ENCOUNTER — Encounter (INDEPENDENT_AMBULATORY_CARE_PROVIDER_SITE_OTHER): Payer: Self-pay | Admitting: *Deleted

## 2014-05-11 ENCOUNTER — Encounter (INDEPENDENT_AMBULATORY_CARE_PROVIDER_SITE_OTHER): Payer: Self-pay | Admitting: Internal Medicine

## 2014-05-11 ENCOUNTER — Encounter (INDEPENDENT_AMBULATORY_CARE_PROVIDER_SITE_OTHER): Payer: Self-pay | Admitting: *Deleted

## 2014-05-11 ENCOUNTER — Ambulatory Visit (INDEPENDENT_AMBULATORY_CARE_PROVIDER_SITE_OTHER): Payer: Medicare Other | Admitting: Internal Medicine

## 2014-05-11 VITALS — BP 100/58 | HR 76 | Temp 97.9°F | Ht 69.7 in | Wt 194.3 lb

## 2014-05-11 DIAGNOSIS — I2510 Atherosclerotic heart disease of native coronary artery without angina pectoris: Secondary | ICD-10-CM | POA: Diagnosis not present

## 2014-05-11 DIAGNOSIS — R1310 Dysphagia, unspecified: Secondary | ICD-10-CM

## 2014-05-11 NOTE — Progress Notes (Signed)
Subjective:     Patient ID: Dustin Blankenship, male   DOB: Jul 16, 1920, 80 y.o.   MRN: [id number]  HPI Last seeen in 2013.  C/o dysphagia. Patient tells me he wants his esophagus stretched. He is a resident of [facility] x 30 days. He says foods are lodging in his esophagus.  He tells me he has lost 12 pounds in 30 days. Weight in 2013 219. Today weight is 194.3).  He has given up all wheat bread because it will lodge. He is has problems with dysphagia for about 3 yrs.   It takes him about an hour to eat. He avoids meats when possible Rarely has acid reflux. He says he controls with Gaviscon. He is on a low salt diet.  Breakfast: eggs, sausage links, rye bread. Lunch 3 vegetables, chicken or beef Supper Hot doge or half a sandwich.    Procedure Date: 07/17/2012  Procedure: EGD with ED.  Indications: Patient is 80 year old Caucasian male with chronic GERD whose heartburn is well controlled with PPI who presents with dysphagia primarily to solids and at x2 pills. He is undergoing EGD and ED.  Impression:  Small sliding hiatal hernia otherwise normal EGD.  Esophagus dilated by passing 54 French Maloney dilator no mucosal disruption noted.  Therefore suspect esophageal motility disorder.        Review of Systems Past Medical History  Diagnosis Date  . GERD (gastroesophageal reflux disease)   . COPD (chronic obstructive pulmonary disease)   . Glaucoma   . Hypothyroid   . Pacemaker 09/20/2010     ST Jude Accent DR RF device  MODEL #PN2210,SERIAL #[id number]  . Glaucoma   . Pulmonary HTN     DUE TO CHRONIC HYPOXIA   FROM COPD  . CHF (congestive heart failure)     right heart failure and pulm htn due to chronic hypoxia from COPD  . Atrial fib/flutter, transient     not on warfarin due to risk of falls ,on aspirin for prophylaxis  . Sinus node dysfunction     Dual-chamber permanent pacermaker ST JUDE  . Gout     Past Surgical History  Procedure Laterality Date  . Doppler  echocardiography  07/25/2011    EF =50-55%  . Nm myocar perf wall motion  07/25/2011  . Insert / replace / remove pacemaker  09/20/2010    ST JUDE ACCENT-dual chamber ;    . Lung biopsy    . Colonoscopy w/ polypectomy      Allergies  Allergen Reactions  . Morphine And Related   . Multaq [Dronedarone]   . Penicillins Other (See Comments)    Unknown  . Latex Rash  . Sulfonamide Derivatives Rash  . Testosterone Rash    The cream form causes a rash when rubbed on skin.     Current Outpatient Prescriptions on File Prior to Visit  Medication Sig Dispense Refill  . allopurinol (ZYLOPRIM) 300 MG tablet Take 300 mg by mouth daily.      Marland Kitchen ALPRAZolam (XANAX) 0.5 MG tablet Take 0.5 mg by mouth at bedtime as needed. Sleep      . aspirin 325 MG EC tablet Take 325 mg by mouth daily.      . clopidogrel (PLAVIX) 75 MG tablet TAKE ONE TABLET BY MOUTH ONCE DAILY.  30 tablet  7  . donepezil (ARICEPT) 10 MG tablet Take 1 tablet by mouth daily.      Marland Kitchen escitalopram (LEXAPRO) 10 MG tablet Take 10 mg by mouth  daily.      . furosemide (LASIX) 40 MG tablet Take 40 mg by mouth as needed.       Marland Kitchen HYDROcodone-acetaminophen (NORCO/VICODIN) 5-325 MG per tablet Take 1 tablet by mouth 2 (two) times daily.      Marland Kitchen LASTACAFT 0.25 % SOLN Place 1 drop into both eyes daily.       Marland Kitchen levothyroxine (SYNTHROID, LEVOTHROID) 125 MCG tablet Take 125 mcg by mouth daily.      Marland Kitchen LUMIGAN 0.03 % ophthalmic solution Place 1 drop into both eyes at bedtime.       . nitroGLYCERIN (NITROSTAT) 0.4 MG SL tablet Place 0.4 mg under the tongue every 5 (five) minutes as needed. Chest Pain      . Polyethyl Glycol-Propyl Glycol (SYSTANE OP) Place 1 drop into both eyes daily.      . polyethylene glycol powder (GLYCOLAX/MIRALAX) powder Take 17 g by mouth daily.       . sotalol (BETAPACE) 80 MG tablet Take 40-80 mg by mouth 2 (two) times daily. Takes 40 mg in the morning and 80 mg in the evening.      Marland Kitchen OVER THE COUNTER MEDICATION Take 1 tablet  by mouth 2 (two) times daily. Prostate Revive, Herbal Supplement       No current facility-administered medications on file prior to visit.   Married. Resident of [facility].     Objective:   Physical Exam Filed Vitals:   05/11/14 1045  BP: 100/58  Pulse: 76  Temp: 97.9 F (36.6 C)  Height: 5' 9.7" (1.77 m)  Weight: 194 lb 4.8 oz (88.134 kg)  Alert and oriented. Skin warm and dry. Oral mucosa is moist.   . Sclera anicteric, conjunctivae is pink. Thyroid not enlarged. No cervical lymphadenopathy. Lungs clear. Heart regular rate and rhythm.  Abdomen is soft. Bowel sounds are positive. No hepatomegaly. No abdominal masses felt. No tenderness.  No edema to lower extremities.      Assessment:    Dysphagia. Probable motility problems.     Plan:     Barium esophagram. I will discuss with Dr. Laural Golden.

## 2014-05-11 NOTE — Patient Instructions (Signed)
Barium pill study

## 2014-05-13 DIAGNOSIS — H1810 Bullous keratopathy, unspecified eye: Secondary | ICD-10-CM | POA: Diagnosis not present

## 2014-05-16 ENCOUNTER — Ambulatory Visit (HOSPITAL_COMMUNITY)
Admission: RE | Admit: 2014-05-16 | Discharge: 2014-05-16 | Disposition: A | Discharge status: Home / Self Care | Payer: Medicare Other | Source: Ambulatory Visit | Attending: Internal Medicine | Admitting: Internal Medicine

## 2014-05-16 DIAGNOSIS — R1310 Dysphagia, unspecified: Secondary | ICD-10-CM | POA: Diagnosis not present

## 2014-05-17 ENCOUNTER — Telehealth (INDEPENDENT_AMBULATORY_CARE_PROVIDER_SITE_OTHER): Payer: Self-pay | Admitting: Internal Medicine

## 2014-05-17 DIAGNOSIS — R1310 Dysphagia, unspecified: Secondary | ICD-10-CM

## 2014-05-17 NOTE — Telephone Encounter (Signed)
Am going to order a DG swallowing function.speech pathology

## 2014-05-18 NOTE — Telephone Encounter (Signed)
Spoke to [place] in physical therapy, she will contact patient with appointment

## 2014-05-20 ENCOUNTER — Other Ambulatory Visit (HOSPITAL_COMMUNITY): Payer: Medicare Other

## 2014-05-25 ENCOUNTER — Telehealth: Payer: Self-pay | Admitting: Cardiovascular Disease

## 2014-05-25 NOTE — Telephone Encounter (Signed)
Please call,pt have moved and he wants to know what to do about getting his pacemaker checked over the phone.

## 2014-05-26 ENCOUNTER — Telehealth: Payer: Self-pay | Admitting: Cardiovascular Disease

## 2014-05-26 ENCOUNTER — Telehealth (INDEPENDENT_AMBULATORY_CARE_PROVIDER_SITE_OTHER): Payer: Self-pay | Admitting: *Deleted

## 2014-05-26 NOTE — Telephone Encounter (Signed)
See note dated 6-11

## 2014-05-26 NOTE — Telephone Encounter (Signed)
Pt would like to see if there is somebody to help him to connect his new monitoring device.

## 2014-05-26 NOTE — Telephone Encounter (Signed)
I returned his call. He states he did not call me.

## 2014-05-26 NOTE — Telephone Encounter (Signed)
Spoke to [employer] regarding [employer]. Verbal instructions given. She will call tomorrow if she is able to connect the transmitter.

## 2014-05-26 NOTE — Telephone Encounter (Signed)
Please call patient, he has questions about speech pathology appointment you wanted him to have

## 2014-06-06 ENCOUNTER — Other Ambulatory Visit (HOSPITAL_COMMUNITY): Payer: Medicare Other

## 2014-06-09 ENCOUNTER — Other Ambulatory Visit (HOSPITAL_COMMUNITY): Payer: Medicare Other

## 2014-06-10 DIAGNOSIS — M546 Pain in thoracic spine: Secondary | ICD-10-CM | POA: Diagnosis not present

## 2014-06-10 DIAGNOSIS — M999 Biomechanical lesion, unspecified: Secondary | ICD-10-CM | POA: Diagnosis not present

## 2014-06-10 DIAGNOSIS — M5030 Other cervical disc degeneration, unspecified cervical region: Secondary | ICD-10-CM | POA: Diagnosis not present

## 2014-06-10 DIAGNOSIS — M9981 Other biomechanical lesions of cervical region: Secondary | ICD-10-CM | POA: Diagnosis not present

## 2014-06-13 DIAGNOSIS — H1810 Bullous keratopathy, unspecified eye: Secondary | ICD-10-CM | POA: Diagnosis not present

## 2014-06-16 ENCOUNTER — Telehealth (INDEPENDENT_AMBULATORY_CARE_PROVIDER_SITE_OTHER): Payer: Self-pay | Admitting: *Deleted

## 2014-06-16 NOTE — Telephone Encounter (Signed)
Patient calls in leaves message 12:25 pm  He says he is 80 years old and he was unable to go to the hospital to have his tests because he had been [place] on an out of state doctors appt for over a year and he went to that one instead.  Please call him back and reschedule this appt.

## 2014-06-20 DIAGNOSIS — N390 Urinary tract infection, site not specified: Secondary | ICD-10-CM | POA: Diagnosis not present

## 2014-06-20 NOTE — Telephone Encounter (Signed)
Patient has been resch'd to 06/21/14, I spoke to [facility] in physical therapy and she has already spoken to Mr Nunley

## 2014-06-21 ENCOUNTER — Ambulatory Visit (HOSPITAL_COMMUNITY)
Admission: RE | Admit: 2014-06-21 | Discharge: 2014-06-21 | Disposition: A | Discharge status: Home / Self Care | Payer: Medicare Other | Source: Ambulatory Visit | Attending: Internal Medicine | Admitting: Internal Medicine

## 2014-06-21 DIAGNOSIS — IMO0001 Reserved for inherently not codable concepts without codable children: Secondary | ICD-10-CM | POA: Diagnosis not present

## 2014-06-21 DIAGNOSIS — R1311 Dysphagia, oral phase: Secondary | ICD-10-CM | POA: Diagnosis not present

## 2014-06-21 DIAGNOSIS — R1310 Dysphagia, unspecified: Secondary | ICD-10-CM

## 2014-06-21 NOTE — Procedures (Signed)
Objective Swallowing Evaluation: Modified Barium Swallowing Study  Patient Details  Name: Dustin Blankenship MRN: [id number] Date of Birth: 1920-11-09  Today's Date: 06/21/2014 Time: 2:38 PM  - 3:29 PM     Past Medical History:  Past Medical History  Diagnosis Date  . GERD (gastroesophageal reflux disease)   . COPD (chronic obstructive pulmonary disease)   . Glaucoma   . Hypothyroid   . Pacemaker 09/20/2010     ST Jude Accent DR RF device  MODEL #[id number],SERIAL #[id number]  . Glaucoma   . Pulmonary HTN     DUE TO CHRONIC HYPOXIA   FROM COPD  . CHF (congestive heart failure)     right heart failure and pulm htn due to chronic hypoxia from COPD  . Atrial fib/flutter, transient     not on warfarin due to risk of falls ,on aspirin for prophylaxis  . Sinus node dysfunction     Dual-chamber permanent pacermaker ST JUDE  . Gout    Past Surgical History:  Past Surgical History  Procedure Laterality Date  . Doppler echocardiography  07/25/2011    EF =50-55%  . Nm myocar perf wall motion  07/25/2011  . Insert / replace / remove pacemaker  09/20/2010    ST JUDE ACCENT-dual chamber ;    . Lung biopsy    . Colonoscopy w/ polypectomy     HPI:  [name] is a 80 yo male resident at [facility]) who was referred for MBSS by Deberah Castle, NP due to noted aspiration on recent barium swallow (Significant laryngeal penetration and aspiration of contrast with contrast tracking along the anterior wall of the trachea to the carina at and proximal RIGHT mainstem bronchus; diffuse age-related impairment of esophageal motility with incomplete clearance of barium by primary peristaltic waves.). Pt states that food is lodging in his esophagus and it takes him an hour to eat. He avoids meats when possible. He underwent EGD with dilation in 2013 and reports an alleviation in symptoms at that time.      Recommendation/Prognosis  Clinical Impression:   Dysphagia Diagnosis: Mild oral phase  dysphagia;Mild pharyngeal phase dysphagia  Clinical impression: Pt presents with mild oropharyngeal phase dysphagia characterized by prolonged oral transit, weak linqual manipulation, piecemeal deglutition, delay in swallow initiation, reduced tongue base retraction, decreased epiglottic deflection, decreased pharyngeal contraction/pressure resulting in piecemeal deglutition, lingual residuals, mild vallecular residue, mild/mod posterior pharyngeal wall residue (more with puree), and penetration/aspiration before and during the swallow with thin liquids (trace to mod amount). Aspiration was inconsistent, but was most pronounced when taking sequential swallows thin while swallowing the pill. Pt intially did not feel aspirate, but did once it dripped down to ~3 tracheal ring which resulted in strong coughing and expulsion of aspirate. Chin tuck was implemented and effective in preventing aspiration (however intermittent trace flash penetration occurred). Pt receptive to compensatory strategies to minimize risk for aspiration. Recommend regular textures with thin liquids with use of chin tuck for every swallow of liquid, clear throat, and repeat with dry swallow. Cue pt for effortful swallow with all other textures. Study reviewed with pt and questions answered. Pt interested in doing whatever he can to improve his swallowing "for the time (he) has left". Recommend skilled SLP dysphagia intervention for diet toleration, training of compensatory strategies, and therapeutic exercises (pharyngeal strengthening) via outpatient/home health at [facility]. Pt agreeable to plan of care.    Swallow Evaluation Recommendations:  Diet Recommendations: Regular;Thin liquid Liquid Administration via: Cup Medication  Administration: Whole meds with puree Supervision: Patient able to self feed;Intermittent supervision to cue for compensatory strategies Compensations: Slow rate;Small sips/bites;Multiple dry swallows after each  bite/sip;Clear throat intermittently;Effortful swallow Postural Changes and/or Swallow Maneuvers: Seated upright 90 degrees;Upright 30-60 min after meal;Chin tuck (chin tuck with liquids) Oral Care Recommendations: Oral care BID Other Recommendations: Clarify dietary restrictions Follow up Recommendations: Outpatient SLP;Home health SLP    Prognosis:   Good   Individuals Consulted: Consulted and Agree with Results and Recommendations: Patient Report Sent to : Facility (Comment);Referring physician    General: Date of Onset: 05/12/14 Type of Study: Modified Barium Swallowing Study Reason for Referral: Objectively evaluate swallowing function Previous Swallow Assessment: Barium swallow May 2015 with aspiration of thins Diet Prior to this Study: Regular;Thin liquids Temperature Spikes Noted: No Respiratory Status: Nasal cannula History of Recent Intubation: No Behavior/Cognition: Alert;Cooperative Oral Cavity - Dentition:  (partial upper) Oral Motor / Sensory Function: Within functional limits Self-Feeding Abilities: Able to feed self Patient Positioning: Upright in chair Baseline Vocal Quality: Breathy Volitional Cough: Strong Volitional Swallow: Able to elicit Anatomy: Within functional limits Pharyngeal Secretions: Not observed secondary MBS   Reason for Referral:   Objectively evaluate swallowing function    Oral Phase: Oral Preparation/Oral Phase Oral Phase: Impaired Oral - Solids Oral - Regular: Weak lingual manipulation;Lingual/palatal residue;Piecemeal swallowing;Delayed oral transit   Pharyngeal Phase:  Pharyngeal Phase Pharyngeal Phase: Impaired Pharyngeal - Nectar Pharyngeal - Nectar Cup: Delayed swallow initiation;Premature spillage to valleculae;Reduced pharyngeal peristalsis;Pharyngeal residue - valleculae;Lateral channel residue Pharyngeal - Thin Pharyngeal - Thin Cup: Delayed swallow initiation;Premature spillage to valleculae;Reduced pharyngeal  peristalsis;Reduced airway/laryngeal closure;Penetration/Aspiration during swallow;Penetration/Aspiration before swallow;Pharyngeal residue - valleculae;Lateral channel residue;Moderate aspiration Penetration/Aspiration details (thin cup): Material does not enter airway;Material enters airway, remains ABOVE vocal cords then ejected out;Material enters airway, passes BELOW cords then ejected out Pharyngeal - Thin Straw: Delayed swallow initiation;Premature spillage to valleculae;Reduced pharyngeal peristalsis;Reduced airway/laryngeal closure;Penetration/Aspiration during swallow;Penetration/Aspiration before swallow;Pharyngeal residue - valleculae;Lateral channel residue Penetration/Aspiration details (thin straw): Material does not enter airway;Material enters airway, remains ABOVE vocal cords then ejected out Pharyngeal - Solids Pharyngeal - Puree: Reduced pharyngeal peristalsis;Reduced epiglottic inversion;Reduced tongue base retraction;Pharyngeal residue - valleculae;Pharyngeal residue - posterior pharnyx Pharyngeal - Regular: Reduced pharyngeal peristalsis;Reduced epiglottic inversion;Reduced tongue base retraction;Pharyngeal residue - posterior pharnyx;Pharyngeal residue - valleculae Pharyngeal - Pill:  (pill traversed without incident, but aspirated thin liquid taking it)   Cervical Esophageal Phase  Cervical Esophageal Phase Cervical Esophageal Phase: Impaired Cervical Esophageal Phase - Thin Thin Cup: Prominent cricopharyngeal segment   GN  Functional Assessment Tool Used: mbss Functional Limitations: Swallowing Swallow Current Status (Q7619): At least 20 percent but less than 40 percent impaired, limited or restricted Swallow Goal Status [phone]): At least 1 percent but less than 20 percent impaired, limited or restricted Swallow Discharge Status [id number]): At least 20 percent but less than 40 percent impaired, limited or restricted     Thank you,  Genene Churn,  [place]   [place] 06/21/2014, 4:08 PM

## 2014-06-24 DIAGNOSIS — N390 Urinary tract infection, site not specified: Secondary | ICD-10-CM | POA: Diagnosis not present

## 2014-06-24 DIAGNOSIS — T24019A Burn of unspecified degree of unspecified thigh, initial encounter: Secondary | ICD-10-CM | POA: Diagnosis not present

## 2014-06-28 DIAGNOSIS — J449 Chronic obstructive pulmonary disease, unspecified: Secondary | ICD-10-CM | POA: Diagnosis not present

## 2014-06-28 DIAGNOSIS — Z9981 Dependence on supplemental oxygen: Secondary | ICD-10-CM | POA: Diagnosis not present

## 2014-06-28 DIAGNOSIS — I4891 Unspecified atrial fibrillation: Secondary | ICD-10-CM | POA: Diagnosis not present

## 2014-06-28 DIAGNOSIS — M542 Cervicalgia: Secondary | ICD-10-CM | POA: Diagnosis not present

## 2014-06-28 DIAGNOSIS — I2510 Atherosclerotic heart disease of native coronary artery without angina pectoris: Secondary | ICD-10-CM | POA: Diagnosis not present

## 2014-06-28 DIAGNOSIS — R1312 Dysphagia, oropharyngeal phase: Secondary | ICD-10-CM | POA: Diagnosis not present

## 2014-06-28 DIAGNOSIS — F3289 Other specified depressive episodes: Secondary | ICD-10-CM | POA: Diagnosis not present

## 2014-06-28 DIAGNOSIS — Z950 Presence of cardiac pacemaker: Secondary | ICD-10-CM | POA: Diagnosis not present

## 2014-06-28 DIAGNOSIS — F329 Major depressive disorder, single episode, unspecified: Secondary | ICD-10-CM | POA: Diagnosis not present

## 2014-06-28 DIAGNOSIS — H409 Unspecified glaucoma: Secondary | ICD-10-CM | POA: Diagnosis not present

## 2014-06-28 DIAGNOSIS — IMO0001 Reserved for inherently not codable concepts without codable children: Secondary | ICD-10-CM | POA: Diagnosis not present

## 2014-06-30 DIAGNOSIS — I2510 Atherosclerotic heart disease of native coronary artery without angina pectoris: Secondary | ICD-10-CM | POA: Diagnosis not present

## 2014-06-30 DIAGNOSIS — J449 Chronic obstructive pulmonary disease, unspecified: Secondary | ICD-10-CM | POA: Diagnosis not present

## 2014-06-30 DIAGNOSIS — H409 Unspecified glaucoma: Secondary | ICD-10-CM | POA: Diagnosis not present

## 2014-06-30 DIAGNOSIS — F3289 Other specified depressive episodes: Secondary | ICD-10-CM | POA: Diagnosis not present

## 2014-06-30 DIAGNOSIS — R1312 Dysphagia, oropharyngeal phase: Secondary | ICD-10-CM | POA: Diagnosis not present

## 2014-06-30 DIAGNOSIS — IMO0001 Reserved for inherently not codable concepts without codable children: Secondary | ICD-10-CM | POA: Diagnosis not present

## 2014-06-30 DIAGNOSIS — F329 Major depressive disorder, single episode, unspecified: Secondary | ICD-10-CM | POA: Diagnosis not present

## 2014-07-04 DIAGNOSIS — F3289 Other specified depressive episodes: Secondary | ICD-10-CM | POA: Diagnosis not present

## 2014-07-04 DIAGNOSIS — H409 Unspecified glaucoma: Secondary | ICD-10-CM | POA: Diagnosis not present

## 2014-07-04 DIAGNOSIS — R1312 Dysphagia, oropharyngeal phase: Secondary | ICD-10-CM | POA: Diagnosis not present

## 2014-07-04 DIAGNOSIS — F329 Major depressive disorder, single episode, unspecified: Secondary | ICD-10-CM | POA: Diagnosis not present

## 2014-07-04 DIAGNOSIS — I2510 Atherosclerotic heart disease of native coronary artery without angina pectoris: Secondary | ICD-10-CM | POA: Diagnosis not present

## 2014-07-04 DIAGNOSIS — IMO0001 Reserved for inherently not codable concepts without codable children: Secondary | ICD-10-CM | POA: Diagnosis not present

## 2014-07-04 DIAGNOSIS — J449 Chronic obstructive pulmonary disease, unspecified: Secondary | ICD-10-CM | POA: Diagnosis not present

## 2014-07-08 DIAGNOSIS — F3289 Other specified depressive episodes: Secondary | ICD-10-CM | POA: Diagnosis not present

## 2014-07-08 DIAGNOSIS — H409 Unspecified glaucoma: Secondary | ICD-10-CM | POA: Diagnosis not present

## 2014-07-08 DIAGNOSIS — J449 Chronic obstructive pulmonary disease, unspecified: Secondary | ICD-10-CM | POA: Diagnosis not present

## 2014-07-08 DIAGNOSIS — IMO0001 Reserved for inherently not codable concepts without codable children: Secondary | ICD-10-CM | POA: Diagnosis not present

## 2014-07-08 DIAGNOSIS — F329 Major depressive disorder, single episode, unspecified: Secondary | ICD-10-CM | POA: Diagnosis not present

## 2014-07-08 DIAGNOSIS — I2510 Atherosclerotic heart disease of native coronary artery without angina pectoris: Secondary | ICD-10-CM | POA: Diagnosis not present

## 2014-07-08 DIAGNOSIS — R1312 Dysphagia, oropharyngeal phase: Secondary | ICD-10-CM | POA: Diagnosis not present

## 2014-07-11 DIAGNOSIS — H1810 Bullous keratopathy, unspecified eye: Secondary | ICD-10-CM | POA: Diagnosis not present

## 2014-07-12 DIAGNOSIS — F3289 Other specified depressive episodes: Secondary | ICD-10-CM | POA: Diagnosis not present

## 2014-07-12 DIAGNOSIS — I2510 Atherosclerotic heart disease of native coronary artery without angina pectoris: Secondary | ICD-10-CM | POA: Diagnosis not present

## 2014-07-12 DIAGNOSIS — H409 Unspecified glaucoma: Secondary | ICD-10-CM | POA: Diagnosis not present

## 2014-07-12 DIAGNOSIS — R1312 Dysphagia, oropharyngeal phase: Secondary | ICD-10-CM | POA: Diagnosis not present

## 2014-07-12 DIAGNOSIS — IMO0001 Reserved for inherently not codable concepts without codable children: Secondary | ICD-10-CM | POA: Diagnosis not present

## 2014-07-12 DIAGNOSIS — F329 Major depressive disorder, single episode, unspecified: Secondary | ICD-10-CM | POA: Diagnosis not present

## 2014-07-12 DIAGNOSIS — J449 Chronic obstructive pulmonary disease, unspecified: Secondary | ICD-10-CM | POA: Diagnosis not present

## 2014-07-19 DIAGNOSIS — H409 Unspecified glaucoma: Secondary | ICD-10-CM | POA: Diagnosis not present

## 2014-07-19 DIAGNOSIS — IMO0001 Reserved for inherently not codable concepts without codable children: Secondary | ICD-10-CM | POA: Diagnosis not present

## 2014-07-19 DIAGNOSIS — F3289 Other specified depressive episodes: Secondary | ICD-10-CM | POA: Diagnosis not present

## 2014-07-19 DIAGNOSIS — J449 Chronic obstructive pulmonary disease, unspecified: Secondary | ICD-10-CM | POA: Diagnosis not present

## 2014-07-19 DIAGNOSIS — R1312 Dysphagia, oropharyngeal phase: Secondary | ICD-10-CM | POA: Diagnosis not present

## 2014-07-19 DIAGNOSIS — F329 Major depressive disorder, single episode, unspecified: Secondary | ICD-10-CM | POA: Diagnosis not present

## 2014-07-19 DIAGNOSIS — I2510 Atherosclerotic heart disease of native coronary artery without angina pectoris: Secondary | ICD-10-CM | POA: Diagnosis not present

## 2014-07-20 DIAGNOSIS — Z111 Encounter for screening for respiratory tuberculosis: Secondary | ICD-10-CM | POA: Diagnosis not present

## 2014-07-21 DIAGNOSIS — R1312 Dysphagia, oropharyngeal phase: Secondary | ICD-10-CM | POA: Diagnosis not present

## 2014-07-21 DIAGNOSIS — IMO0001 Reserved for inherently not codable concepts without codable children: Secondary | ICD-10-CM | POA: Diagnosis not present

## 2014-07-21 DIAGNOSIS — H409 Unspecified glaucoma: Secondary | ICD-10-CM | POA: Diagnosis not present

## 2014-07-21 DIAGNOSIS — J449 Chronic obstructive pulmonary disease, unspecified: Secondary | ICD-10-CM | POA: Diagnosis not present

## 2014-07-21 DIAGNOSIS — F329 Major depressive disorder, single episode, unspecified: Secondary | ICD-10-CM | POA: Diagnosis not present

## 2014-07-21 DIAGNOSIS — I2510 Atherosclerotic heart disease of native coronary artery without angina pectoris: Secondary | ICD-10-CM | POA: Diagnosis not present

## 2014-07-21 DIAGNOSIS — F3289 Other specified depressive episodes: Secondary | ICD-10-CM | POA: Diagnosis not present

## 2014-07-25 DIAGNOSIS — H1810 Bullous keratopathy, unspecified eye: Secondary | ICD-10-CM | POA: Diagnosis not present

## 2014-07-26 DIAGNOSIS — F3289 Other specified depressive episodes: Secondary | ICD-10-CM | POA: Diagnosis not present

## 2014-07-26 DIAGNOSIS — IMO0001 Reserved for inherently not codable concepts without codable children: Secondary | ICD-10-CM | POA: Diagnosis not present

## 2014-07-26 DIAGNOSIS — F329 Major depressive disorder, single episode, unspecified: Secondary | ICD-10-CM | POA: Diagnosis not present

## 2014-07-26 DIAGNOSIS — J449 Chronic obstructive pulmonary disease, unspecified: Secondary | ICD-10-CM | POA: Diagnosis not present

## 2014-07-26 DIAGNOSIS — H409 Unspecified glaucoma: Secondary | ICD-10-CM | POA: Diagnosis not present

## 2014-07-26 DIAGNOSIS — R1312 Dysphagia, oropharyngeal phase: Secondary | ICD-10-CM | POA: Diagnosis not present

## 2014-07-26 DIAGNOSIS — I2510 Atherosclerotic heart disease of native coronary artery without angina pectoris: Secondary | ICD-10-CM | POA: Diagnosis not present

## 2014-07-28 ENCOUNTER — Encounter: Payer: Medicare Other | Admitting: *Deleted

## 2014-07-28 ENCOUNTER — Telehealth: Payer: Self-pay | Admitting: Cardiology

## 2014-07-28 DIAGNOSIS — R1312 Dysphagia, oropharyngeal phase: Secondary | ICD-10-CM | POA: Diagnosis not present

## 2014-07-28 DIAGNOSIS — IMO0001 Reserved for inherently not codable concepts without codable children: Secondary | ICD-10-CM | POA: Diagnosis not present

## 2014-07-28 DIAGNOSIS — F3289 Other specified depressive episodes: Secondary | ICD-10-CM | POA: Diagnosis not present

## 2014-07-28 DIAGNOSIS — J449 Chronic obstructive pulmonary disease, unspecified: Secondary | ICD-10-CM | POA: Diagnosis not present

## 2014-07-28 DIAGNOSIS — F329 Major depressive disorder, single episode, unspecified: Secondary | ICD-10-CM | POA: Diagnosis not present

## 2014-07-28 DIAGNOSIS — H409 Unspecified glaucoma: Secondary | ICD-10-CM | POA: Diagnosis not present

## 2014-07-28 DIAGNOSIS — I2510 Atherosclerotic heart disease of native coronary artery without angina pectoris: Secondary | ICD-10-CM | POA: Diagnosis not present

## 2014-07-28 NOTE — Telephone Encounter (Signed)
LMOVM reminding pt to send remote transmission.   

## 2014-07-29 ENCOUNTER — Encounter: Payer: Self-pay | Admitting: Cardiology

## 2014-08-02 DIAGNOSIS — IMO0001 Reserved for inherently not codable concepts without codable children: Secondary | ICD-10-CM | POA: Diagnosis not present

## 2014-08-02 DIAGNOSIS — F329 Major depressive disorder, single episode, unspecified: Secondary | ICD-10-CM | POA: Diagnosis not present

## 2014-08-02 DIAGNOSIS — I2510 Atherosclerotic heart disease of native coronary artery without angina pectoris: Secondary | ICD-10-CM | POA: Diagnosis not present

## 2014-08-02 DIAGNOSIS — R1312 Dysphagia, oropharyngeal phase: Secondary | ICD-10-CM | POA: Diagnosis not present

## 2014-08-02 DIAGNOSIS — F3289 Other specified depressive episodes: Secondary | ICD-10-CM | POA: Diagnosis not present

## 2014-08-02 DIAGNOSIS — H409 Unspecified glaucoma: Secondary | ICD-10-CM | POA: Diagnosis not present

## 2014-08-02 DIAGNOSIS — J449 Chronic obstructive pulmonary disease, unspecified: Secondary | ICD-10-CM | POA: Diagnosis not present

## 2014-08-04 ENCOUNTER — Telehealth: Payer: Self-pay | Admitting: Cardiovascular Disease

## 2014-08-04 DIAGNOSIS — IMO0001 Reserved for inherently not codable concepts without codable children: Secondary | ICD-10-CM | POA: Diagnosis not present

## 2014-08-04 DIAGNOSIS — I2510 Atherosclerotic heart disease of native coronary artery without angina pectoris: Secondary | ICD-10-CM | POA: Diagnosis not present

## 2014-08-04 DIAGNOSIS — F3289 Other specified depressive episodes: Secondary | ICD-10-CM | POA: Diagnosis not present

## 2014-08-04 DIAGNOSIS — J449 Chronic obstructive pulmonary disease, unspecified: Secondary | ICD-10-CM | POA: Diagnosis not present

## 2014-08-04 DIAGNOSIS — R1312 Dysphagia, oropharyngeal phase: Secondary | ICD-10-CM | POA: Diagnosis not present

## 2014-08-04 DIAGNOSIS — F329 Major depressive disorder, single episode, unspecified: Secondary | ICD-10-CM | POA: Diagnosis not present

## 2014-08-04 DIAGNOSIS — H409 Unspecified glaucoma: Secondary | ICD-10-CM | POA: Diagnosis not present

## 2014-08-04 NOTE — Telephone Encounter (Signed)
Please call,concerning the machine that monitor his pacemaker. He have had the machine for for 4 months and nobody have come to set it up.

## 2014-08-05 DIAGNOSIS — H1810 Bullous keratopathy, unspecified eye: Secondary | ICD-10-CM | POA: Diagnosis not present

## 2014-08-08 NOTE — Telephone Encounter (Signed)
Please call her again,she will help him.

## 2014-08-09 NOTE — Telephone Encounter (Signed)
Returned call to Boyd with [facility].She stated patient has now moved into a assisted living.Stated patient does not have a land line,just a cell phone.Stated he needs pacemaker tele adapter.Message sent to device clinic.

## 2014-08-11 NOTE — Telephone Encounter (Signed)
Spoke with Elmyra Ricks with [facility] informed her that we do have cell adapters here at the office and stated that she will come by and pick it up cell adapter.

## 2014-08-25 DIAGNOSIS — H1810 Bullous keratopathy, unspecified eye: Secondary | ICD-10-CM | POA: Diagnosis not present

## 2014-08-31 DIAGNOSIS — J449 Chronic obstructive pulmonary disease, unspecified: Secondary | ICD-10-CM | POA: Diagnosis not present

## 2014-08-31 DIAGNOSIS — I259 Chronic ischemic heart disease, unspecified: Secondary | ICD-10-CM | POA: Diagnosis not present

## 2014-08-31 DIAGNOSIS — Z23 Encounter for immunization: Secondary | ICD-10-CM | POA: Diagnosis not present

## 2014-08-31 DIAGNOSIS — N400 Enlarged prostate without lower urinary tract symptoms: Secondary | ICD-10-CM | POA: Diagnosis not present

## 2014-08-31 DIAGNOSIS — R413 Other amnesia: Secondary | ICD-10-CM | POA: Diagnosis not present

## 2014-09-13 DIAGNOSIS — H1810 Bullous keratopathy, unspecified eye: Secondary | ICD-10-CM | POA: Diagnosis not present

## 2014-09-22 DIAGNOSIS — H93293 Other abnormal auditory perceptions, bilateral: Secondary | ICD-10-CM | POA: Diagnosis not present

## 2014-09-22 DIAGNOSIS — H9313 Tinnitus, bilateral: Secondary | ICD-10-CM | POA: Diagnosis not present

## 2014-09-22 DIAGNOSIS — H903 Sensorineural hearing loss, bilateral: Secondary | ICD-10-CM | POA: Diagnosis not present

## 2014-09-27 DIAGNOSIS — H6120 Impacted cerumen, unspecified ear: Secondary | ICD-10-CM | POA: Diagnosis not present

## 2014-10-10 DIAGNOSIS — J449 Chronic obstructive pulmonary disease, unspecified: Secondary | ICD-10-CM | POA: Diagnosis not present

## 2014-10-13 DIAGNOSIS — H1811 Bullous keratopathy, right eye: Secondary | ICD-10-CM | POA: Diagnosis not present

## 2014-10-19 DIAGNOSIS — F329 Major depressive disorder, single episode, unspecified: Secondary | ICD-10-CM | POA: Diagnosis not present

## 2014-10-19 DIAGNOSIS — I2510 Atherosclerotic heart disease of native coronary artery without angina pectoris: Secondary | ICD-10-CM | POA: Diagnosis not present

## 2014-10-19 DIAGNOSIS — N393 Stress incontinence (female) (male): Secondary | ICD-10-CM | POA: Diagnosis not present

## 2014-10-19 DIAGNOSIS — Z9981 Dependence on supplemental oxygen: Secondary | ICD-10-CM | POA: Diagnosis not present

## 2014-10-19 DIAGNOSIS — Z950 Presence of cardiac pacemaker: Secondary | ICD-10-CM | POA: Diagnosis not present

## 2014-10-19 DIAGNOSIS — I509 Heart failure, unspecified: Secondary | ICD-10-CM | POA: Diagnosis not present

## 2014-10-19 DIAGNOSIS — J449 Chronic obstructive pulmonary disease, unspecified: Secondary | ICD-10-CM | POA: Diagnosis not present

## 2014-10-19 DIAGNOSIS — M1990 Unspecified osteoarthritis, unspecified site: Secondary | ICD-10-CM | POA: Diagnosis not present

## 2014-10-19 DIAGNOSIS — M542 Cervicalgia: Secondary | ICD-10-CM | POA: Diagnosis not present

## 2014-10-19 DIAGNOSIS — I10 Essential (primary) hypertension: Secondary | ICD-10-CM | POA: Diagnosis not present

## 2014-10-19 DIAGNOSIS — R1311 Dysphagia, oral phase: Secondary | ICD-10-CM | POA: Diagnosis not present

## 2014-10-19 DIAGNOSIS — I272 Other secondary pulmonary hypertension: Secondary | ICD-10-CM | POA: Diagnosis not present

## 2014-10-19 DIAGNOSIS — N401 Enlarged prostate with lower urinary tract symptoms: Secondary | ICD-10-CM | POA: Diagnosis not present

## 2014-10-19 DIAGNOSIS — I481 Persistent atrial fibrillation: Secondary | ICD-10-CM | POA: Diagnosis not present

## 2014-11-04 DIAGNOSIS — N390 Urinary tract infection, site not specified: Secondary | ICD-10-CM | POA: Diagnosis not present

## 2014-11-04 DIAGNOSIS — I10 Essential (primary) hypertension: Secondary | ICD-10-CM | POA: Diagnosis not present

## 2014-11-15 DIAGNOSIS — N390 Urinary tract infection, site not specified: Secondary | ICD-10-CM | POA: Diagnosis not present

## 2014-11-15 DIAGNOSIS — I10 Essential (primary) hypertension: Secondary | ICD-10-CM | POA: Diagnosis not present

## 2014-11-18 DIAGNOSIS — H01009 Unspecified blepharitis unspecified eye, unspecified eyelid: Secondary | ICD-10-CM | POA: Diagnosis not present

## 2014-11-18 DIAGNOSIS — H1811 Bullous keratopathy, right eye: Secondary | ICD-10-CM | POA: Diagnosis not present

## 2014-12-07 DIAGNOSIS — J449 Chronic obstructive pulmonary disease, unspecified: Secondary | ICD-10-CM | POA: Diagnosis not present

## 2014-12-12 DIAGNOSIS — H1811 Bullous keratopathy, right eye: Secondary | ICD-10-CM | POA: Diagnosis not present

## 2014-12-13 ENCOUNTER — Encounter: Payer: Self-pay | Admitting: *Deleted

## 2014-12-18 DIAGNOSIS — I509 Heart failure, unspecified: Secondary | ICD-10-CM | POA: Diagnosis not present

## 2014-12-18 DIAGNOSIS — F329 Major depressive disorder, single episode, unspecified: Secondary | ICD-10-CM | POA: Diagnosis not present

## 2014-12-18 DIAGNOSIS — I10 Essential (primary) hypertension: Secondary | ICD-10-CM | POA: Diagnosis not present

## 2014-12-18 DIAGNOSIS — Z950 Presence of cardiac pacemaker: Secondary | ICD-10-CM | POA: Diagnosis not present

## 2014-12-18 DIAGNOSIS — M1990 Unspecified osteoarthritis, unspecified site: Secondary | ICD-10-CM | POA: Diagnosis not present

## 2014-12-18 DIAGNOSIS — N401 Enlarged prostate with lower urinary tract symptoms: Secondary | ICD-10-CM | POA: Diagnosis not present

## 2014-12-18 DIAGNOSIS — I4891 Unspecified atrial fibrillation: Secondary | ICD-10-CM | POA: Diagnosis not present

## 2014-12-18 DIAGNOSIS — M542 Cervicalgia: Secondary | ICD-10-CM | POA: Diagnosis not present

## 2014-12-18 DIAGNOSIS — N393 Stress incontinence (female) (male): Secondary | ICD-10-CM | POA: Diagnosis not present

## 2014-12-18 DIAGNOSIS — Z9981 Dependence on supplemental oxygen: Secondary | ICD-10-CM | POA: Diagnosis not present

## 2014-12-18 DIAGNOSIS — J449 Chronic obstructive pulmonary disease, unspecified: Secondary | ICD-10-CM | POA: Diagnosis not present

## 2014-12-18 DIAGNOSIS — Z7982 Long term (current) use of aspirin: Secondary | ICD-10-CM | POA: Diagnosis not present

## 2014-12-18 DIAGNOSIS — I2510 Atherosclerotic heart disease of native coronary artery without angina pectoris: Secondary | ICD-10-CM | POA: Diagnosis not present

## 2014-12-19 ENCOUNTER — Telehealth: Payer: Self-pay | Admitting: Cardiovascular Disease

## 2014-12-19 NOTE — Telephone Encounter (Signed)
Dustin Blankenship , Dustin Blankenship does not drive and wants to stay in [place] to have his pacer check .  Does have the machine to do a remote check . Please call    Thanks

## 2014-12-19 NOTE — Telephone Encounter (Signed)
LMTCB//sss

## 2014-12-20 DIAGNOSIS — I10 Essential (primary) hypertension: Secondary | ICD-10-CM | POA: Diagnosis not present

## 2014-12-20 DIAGNOSIS — J449 Chronic obstructive pulmonary disease, unspecified: Secondary | ICD-10-CM | POA: Diagnosis not present

## 2014-12-20 DIAGNOSIS — I2510 Atherosclerotic heart disease of native coronary artery without angina pectoris: Secondary | ICD-10-CM | POA: Diagnosis not present

## 2014-12-20 DIAGNOSIS — I4891 Unspecified atrial fibrillation: Secondary | ICD-10-CM | POA: Diagnosis not present

## 2014-12-20 DIAGNOSIS — I509 Heart failure, unspecified: Secondary | ICD-10-CM | POA: Diagnosis not present

## 2014-12-20 DIAGNOSIS — M542 Cervicalgia: Secondary | ICD-10-CM | POA: Diagnosis not present

## 2014-12-27 DIAGNOSIS — J449 Chronic obstructive pulmonary disease, unspecified: Secondary | ICD-10-CM | POA: Diagnosis not present

## 2014-12-27 DIAGNOSIS — I2510 Atherosclerotic heart disease of native coronary artery without angina pectoris: Secondary | ICD-10-CM | POA: Diagnosis not present

## 2014-12-27 DIAGNOSIS — I10 Essential (primary) hypertension: Secondary | ICD-10-CM | POA: Diagnosis not present

## 2014-12-27 DIAGNOSIS — I509 Heart failure, unspecified: Secondary | ICD-10-CM | POA: Diagnosis not present

## 2014-12-27 DIAGNOSIS — M542 Cervicalgia: Secondary | ICD-10-CM | POA: Diagnosis not present

## 2014-12-27 DIAGNOSIS — I4891 Unspecified atrial fibrillation: Secondary | ICD-10-CM | POA: Diagnosis not present

## 2014-12-27 NOTE — Telephone Encounter (Signed)
Unable to LM on mobile device//sss  Pt is no longer living at assisted living per RN---(Home #)

## 2014-12-27 NOTE — Telephone Encounter (Signed)
Dustin Blankenship please call .Marland Kitchen Thanks

## 2015-01-02 DIAGNOSIS — H1811 Bullous keratopathy, right eye: Secondary | ICD-10-CM | POA: Diagnosis not present

## 2015-01-03 DIAGNOSIS — I10 Essential (primary) hypertension: Secondary | ICD-10-CM | POA: Diagnosis not present

## 2015-01-03 DIAGNOSIS — I509 Heart failure, unspecified: Secondary | ICD-10-CM | POA: Diagnosis not present

## 2015-01-03 DIAGNOSIS — I2510 Atherosclerotic heart disease of native coronary artery without angina pectoris: Secondary | ICD-10-CM | POA: Diagnosis not present

## 2015-01-03 DIAGNOSIS — M542 Cervicalgia: Secondary | ICD-10-CM | POA: Diagnosis not present

## 2015-01-03 DIAGNOSIS — J449 Chronic obstructive pulmonary disease, unspecified: Secondary | ICD-10-CM | POA: Diagnosis not present

## 2015-01-03 DIAGNOSIS — I4891 Unspecified atrial fibrillation: Secondary | ICD-10-CM | POA: Diagnosis not present

## 2015-01-09 ENCOUNTER — Ambulatory Visit (INDEPENDENT_AMBULATORY_CARE_PROVIDER_SITE_OTHER): Payer: Medicare Other | Admitting: Internal Medicine

## 2015-01-09 ENCOUNTER — Encounter (INDEPENDENT_AMBULATORY_CARE_PROVIDER_SITE_OTHER): Payer: Self-pay | Admitting: Internal Medicine

## 2015-01-09 VITALS — BP 110/70 | HR 64 | Temp 97.1°F | Resp 18 | Ht 69.7 in | Wt 192.1 lb

## 2015-01-09 DIAGNOSIS — R1319 Other dysphagia: Secondary | ICD-10-CM

## 2015-01-09 DIAGNOSIS — R1310 Dysphagia, unspecified: Secondary | ICD-10-CM

## 2015-01-09 DIAGNOSIS — R1314 Dysphagia, pharyngoesophageal phase: Secondary | ICD-10-CM | POA: Diagnosis not present

## 2015-01-09 NOTE — Progress Notes (Signed)
Presenting complaint;  Swallowing difficulty.  Subjective:  Patient is 81 year old Caucasian male who is here for scheduled visit. He was last seen in May 2015. He is accompanied by his wife. Patient reports worsening swallowing difficulty. He may strangle on liquids at times but he is having more difficulty with solids and particularly meats. He points to mid sternal area as site of bolus obstruction. His wife states that she has been tempted to give him Heimlich maneuver but he has gotten better by waiting. He denies heartburn nausea vomiting abdominal pain melena or rectal bleeding. His bowels move daily. His weight is only down by two pounds. Patient reports that his swallowing improved when his esophagus was dilated back in August 2013. He states improvement lasted for several months.  Following his last visit in May 2015 he underwent a barium pill study on 05/16/2014 revealing laryngeal penetration with aspiration and he was also found to have esophageal motility disorder. Patient was evaluated by Mrs. Genene Churn of speech pathology and patient states he is following our recommendations.    Current Medications: Outpatient Encounter Prescriptions as of 01/09/2015  Medication Sig  . allopurinol (ZYLOPRIM) 300 MG tablet Take 300 mg by mouth daily.  Marland Kitchen aspirin 325 MG EC tablet Take 325 mg by mouth daily.  . clopidogrel (PLAVIX) 75 MG tablet TAKE ONE TABLET BY MOUTH ONCE DAILY.  Marland Kitchen donepezil (ARICEPT) 10 MG tablet Take 1 tablet by mouth daily.  . furosemide (LASIX) 40 MG tablet Take 40 mg by mouth as needed.   Marland Kitchen HYDROcodone-acetaminophen (NORCO/VICODIN) 5-325 MG per tablet Take 1 tablet by mouth 2 (two) times daily.  Marland Kitchen LASTACAFT 0.25 % SOLN Place 1 drop into both eyes daily.   Marland Kitchen levothyroxine (SYNTHROID, LEVOTHROID) 125 MCG tablet Take 125 mcg by mouth daily.  Marland Kitchen LUMIGAN 0.03 % ophthalmic solution Place 1 drop into both eyes at bedtime.   . nitroGLYCERIN (NITROSTAT) 0.4 MG SL tablet Place  0.4 mg under the tongue every 5 (five) minutes as needed. Chest Pain  . Polyethyl Glycol-Propyl Glycol (SYSTANE OP) Place 1 drop into both eyes daily.  . saw palmetto 500 MG capsule Take 500 mg by mouth daily.  . sodium chloride (MURO 128) 2 % ophthalmic solution 1 drop.  . sotalol (BETAPACE) 80 MG tablet Take 40-80 mg by mouth 2 (two) times daily. Takes 40 mg in the morning and 80 mg in the evening.  Marland Kitchen ALPRAZolam (XANAX) 0.5 MG tablet Take 0.5 mg by mouth at bedtime as needed. Sleep  . [DISCONTINUED] escitalopram (LEXAPRO) 10 MG tablet Take 10 mg by mouth daily.  . [DISCONTINUED] HYDROcodone-acetaminophen (NORCO/VICODIN) 5-325 MG per tablet Take 1 tablet by mouth every 6 (six) hours as needed for moderate pain.  . [DISCONTINUED] OVER THE COUNTER MEDICATION Take 1 tablet by mouth 2 (two) times daily. Prostate Revive, Herbal Supplement  . [DISCONTINUED] polyethylene glycol powder (GLYCOLAX/MIRALAX) powder Take 17 g by mouth daily.      Objective: Blood pressure 110/70, pulse 64, temperature 97.1 F (36.2 C), temperature source Oral, resp. rate 18, height 5' 9.7" (1.77 m), weight 192 lb 1.6 oz (87.136 kg). Patient is alert and in no acute distress. Patient is blind in right eye. Conjunctiva is pink. Sclera is nonicteric Oropharyngeal mucosa is normal. No neck masses or thyromegaly noted. Cardiac exam with regular rhythm normal S1 and S2. No murmur or gallop noted. Lungs are clear to auscultation. Abdomen is full but soft and nontender without organomegaly or masses. Extremities thin with ecchymosis over  both forearms.  Labs/studies Results:  Barium pill study from 05/16/2014 reviewed with patient and his wife. Barium pill passed through the esophagus without any delay.  Assessment:  #1. Dysphagia primarily to solids most likely due to esophageal motility disorder. Patient is not interested in further evaluation with esophageal manometry which may help manage his condition better. Since  he responded to esophageal dilation in August 2013 he would like to have his esophagus dilated again. However he would like to wait for couple months before considering this. He will call our office if solid food dysphagia gets worse.   Plan:  Esophagogastroduodenoscopy, possible esophageal dilation within the next 3 months unless swallowing difficulty becomes worse.Marland Kitchen

## 2015-01-09 NOTE — Patient Instructions (Signed)
Please call office if swallowing difficulty gets worse.

## 2015-01-12 NOTE — Telephone Encounter (Deleted)
New Message        Pt's wife calling stating that pt would like to be seen at the [place] office from now on. Pt's wife states that they loves the care the pt receives from Dr. Sallyanne Kuster but they no longer drive and have a hard time making it to [place]. Pt's wife is wanting to know if Dr. Sallyanne Kuster can release pt from his care so they can see another Cardiologist in [place]. Please call back and advise.

## 2015-01-13 ENCOUNTER — Encounter: Payer: Self-pay | Admitting: *Deleted

## 2015-01-16 ENCOUNTER — Telehealth: Payer: Self-pay | Admitting: *Deleted

## 2015-01-16 NOTE — Telephone Encounter (Signed)
-----  Message from [place] sent at 01/12/2015  4:15 PM EST ----- Regarding: Pt requesting release from care Contact: [phone] Pt's wife calling stating that pt would like to be seen at the [place] office from now on. Pt's wife states that they loves the care the pt receives from Dr. Sallyanne Kuster but they no longer drive and have a hard time making it to [place]. Pt's wife is wanting to know if Dr. Sallyanne Kuster can release pt from his care so they can see another Cardiologist in [place]. If this can be done, please let me know so I can notify pt. Thank you.   V/r   Clarene Critchley Respus

## 2015-01-16 NOTE — Telephone Encounter (Signed)
Patient notified it is OK to transfer care to the [place] office.  Told to call [phone] and let them know he has a pacemaker so his appt can be make with the appropriate doctor.

## 2015-01-17 DIAGNOSIS — I509 Heart failure, unspecified: Secondary | ICD-10-CM | POA: Diagnosis not present

## 2015-01-17 DIAGNOSIS — I4891 Unspecified atrial fibrillation: Secondary | ICD-10-CM | POA: Diagnosis not present

## 2015-01-17 DIAGNOSIS — I10 Essential (primary) hypertension: Secondary | ICD-10-CM | POA: Diagnosis not present

## 2015-01-17 DIAGNOSIS — J449 Chronic obstructive pulmonary disease, unspecified: Secondary | ICD-10-CM | POA: Diagnosis not present

## 2015-01-17 DIAGNOSIS — M542 Cervicalgia: Secondary | ICD-10-CM | POA: Diagnosis not present

## 2015-01-17 DIAGNOSIS — I2510 Atherosclerotic heart disease of native coronary artery without angina pectoris: Secondary | ICD-10-CM | POA: Diagnosis not present

## 2015-01-27 DIAGNOSIS — H93293 Other abnormal auditory perceptions, bilateral: Secondary | ICD-10-CM | POA: Diagnosis not present

## 2015-01-27 DIAGNOSIS — H903 Sensorineural hearing loss, bilateral: Secondary | ICD-10-CM | POA: Diagnosis not present

## 2015-01-31 DIAGNOSIS — I10 Essential (primary) hypertension: Secondary | ICD-10-CM | POA: Diagnosis not present

## 2015-01-31 DIAGNOSIS — M542 Cervicalgia: Secondary | ICD-10-CM | POA: Diagnosis not present

## 2015-01-31 DIAGNOSIS — I509 Heart failure, unspecified: Secondary | ICD-10-CM | POA: Diagnosis not present

## 2015-01-31 DIAGNOSIS — I4891 Unspecified atrial fibrillation: Secondary | ICD-10-CM | POA: Diagnosis not present

## 2015-01-31 DIAGNOSIS — J449 Chronic obstructive pulmonary disease, unspecified: Secondary | ICD-10-CM | POA: Diagnosis not present

## 2015-01-31 DIAGNOSIS — I2510 Atherosclerotic heart disease of native coronary artery without angina pectoris: Secondary | ICD-10-CM | POA: Diagnosis not present

## 2015-02-03 ENCOUNTER — Ambulatory Visit (INDEPENDENT_AMBULATORY_CARE_PROVIDER_SITE_OTHER): Payer: Medicare Other | Admitting: *Deleted

## 2015-02-03 DIAGNOSIS — I495 Sick sinus syndrome: Secondary | ICD-10-CM

## 2015-02-03 LAB — MDC_IDC_ENUM_SESS_TYPE_INCLINIC
Implantable Pulse Generator Serial Number: 7182542
Lead Channel Impedance Value: 400 Ohm
Lead Channel Impedance Value: 512.5 Ohm
Lead Channel Pacing Threshold Amplitude: 0.625 V
Lead Channel Pacing Threshold Amplitude: 1 V
Lead Channel Pacing Threshold Amplitude: 1 V
Lead Channel Pacing Threshold Pulse Width: 0.8 ms
Lead Channel Sensing Intrinsic Amplitude: 12 mV
Lead Channel Setting Pacing Amplitude: 0.875
Lead Channel Setting Pacing Amplitude: 2 V
Lead Channel Setting Pacing Pulse Width: 0.5 ms
Lead Channel Setting Sensing Sensitivity: 2 mV
MDC IDC MSMT BATTERY REMAINING LONGEVITY: 74.4 mo
MDC IDC MSMT BATTERY VOLTAGE: 2.92 V
MDC IDC MSMT LEADCHNL RA PACING THRESHOLD PULSEWIDTH: 0.8 ms
MDC IDC MSMT LEADCHNL RA SENSING INTR AMPL: 1.5 mV
MDC IDC MSMT LEADCHNL RV PACING THRESHOLD PULSEWIDTH: 0.5 ms
MDC IDC SESS DTM: 20160219152326
MDC IDC STAT BRADY RA PERCENT PACED: 77 %
MDC IDC STAT BRADY RV PERCENT PACED: 19 %

## 2015-02-03 NOTE — Progress Notes (Signed)
PPM check in clinic

## 2015-02-07 DIAGNOSIS — J449 Chronic obstructive pulmonary disease, unspecified: Secondary | ICD-10-CM | POA: Diagnosis not present

## 2015-02-07 DIAGNOSIS — R233 Spontaneous ecchymoses: Secondary | ICD-10-CM | POA: Diagnosis not present

## 2015-02-07 DIAGNOSIS — I2510 Atherosclerotic heart disease of native coronary artery without angina pectoris: Secondary | ICD-10-CM | POA: Diagnosis not present

## 2015-02-07 DIAGNOSIS — H40009 Preglaucoma, unspecified, unspecified eye: Secondary | ICD-10-CM | POA: Diagnosis not present

## 2015-02-14 ENCOUNTER — Other Ambulatory Visit (INDEPENDENT_AMBULATORY_CARE_PROVIDER_SITE_OTHER): Payer: Self-pay | Admitting: *Deleted

## 2015-02-14 DIAGNOSIS — R1310 Dysphagia, unspecified: Secondary | ICD-10-CM

## 2015-02-14 DIAGNOSIS — I2510 Atherosclerotic heart disease of native coronary artery without angina pectoris: Secondary | ICD-10-CM | POA: Diagnosis not present

## 2015-02-14 DIAGNOSIS — I509 Heart failure, unspecified: Secondary | ICD-10-CM | POA: Diagnosis not present

## 2015-02-14 DIAGNOSIS — I4891 Unspecified atrial fibrillation: Secondary | ICD-10-CM | POA: Diagnosis not present

## 2015-02-14 DIAGNOSIS — I10 Essential (primary) hypertension: Secondary | ICD-10-CM | POA: Diagnosis not present

## 2015-02-14 DIAGNOSIS — M542 Cervicalgia: Secondary | ICD-10-CM | POA: Diagnosis not present

## 2015-02-14 DIAGNOSIS — J449 Chronic obstructive pulmonary disease, unspecified: Secondary | ICD-10-CM | POA: Diagnosis not present

## 2015-02-16 DIAGNOSIS — M542 Cervicalgia: Secondary | ICD-10-CM | POA: Diagnosis not present

## 2015-02-16 DIAGNOSIS — N393 Stress incontinence (female) (male): Secondary | ICD-10-CM | POA: Diagnosis not present

## 2015-02-16 DIAGNOSIS — I2510 Atherosclerotic heart disease of native coronary artery without angina pectoris: Secondary | ICD-10-CM | POA: Diagnosis not present

## 2015-02-16 DIAGNOSIS — N401 Enlarged prostate with lower urinary tract symptoms: Secondary | ICD-10-CM | POA: Diagnosis not present

## 2015-02-16 DIAGNOSIS — J449 Chronic obstructive pulmonary disease, unspecified: Secondary | ICD-10-CM | POA: Diagnosis not present

## 2015-02-16 DIAGNOSIS — Z950 Presence of cardiac pacemaker: Secondary | ICD-10-CM | POA: Diagnosis not present

## 2015-02-16 DIAGNOSIS — M1990 Unspecified osteoarthritis, unspecified site: Secondary | ICD-10-CM | POA: Diagnosis not present

## 2015-02-16 DIAGNOSIS — Z9981 Dependence on supplemental oxygen: Secondary | ICD-10-CM | POA: Diagnosis not present

## 2015-02-16 DIAGNOSIS — I509 Heart failure, unspecified: Secondary | ICD-10-CM | POA: Diagnosis not present

## 2015-02-16 DIAGNOSIS — I10 Essential (primary) hypertension: Secondary | ICD-10-CM | POA: Diagnosis not present

## 2015-02-16 DIAGNOSIS — I4891 Unspecified atrial fibrillation: Secondary | ICD-10-CM | POA: Diagnosis not present

## 2015-02-16 DIAGNOSIS — F329 Major depressive disorder, single episode, unspecified: Secondary | ICD-10-CM | POA: Diagnosis not present

## 2015-02-21 ENCOUNTER — Encounter: Payer: Self-pay | Admitting: Cardiovascular Disease

## 2015-02-23 ENCOUNTER — Other Ambulatory Visit: Payer: Self-pay | Admitting: Dermatology

## 2015-02-23 DIAGNOSIS — C44519 Basal cell carcinoma of skin of other part of trunk: Secondary | ICD-10-CM | POA: Diagnosis not present

## 2015-02-23 DIAGNOSIS — Z85828 Personal history of other malignant neoplasm of skin: Secondary | ICD-10-CM | POA: Diagnosis not present

## 2015-02-23 DIAGNOSIS — L821 Other seborrheic keratosis: Secondary | ICD-10-CM | POA: Diagnosis not present

## 2015-02-23 DIAGNOSIS — D485 Neoplasm of uncertain behavior of skin: Secondary | ICD-10-CM | POA: Diagnosis not present

## 2015-02-24 ENCOUNTER — Encounter (HOSPITAL_COMMUNITY)
Admission: RE | Disposition: A | Discharge status: Home / Self Care | Payer: Self-pay | Source: Ambulatory Visit | Attending: Internal Medicine

## 2015-02-24 ENCOUNTER — Encounter (HOSPITAL_COMMUNITY): Payer: Self-pay | Admitting: *Deleted

## 2015-02-24 ENCOUNTER — Ambulatory Visit (HOSPITAL_COMMUNITY)
Admission: RE | Admit: 2015-02-24 | Discharge: 2015-02-24 | Disposition: A | Discharge status: Home / Self Care | Payer: Medicare Other | Source: Ambulatory Visit | Attending: Internal Medicine | Admitting: Internal Medicine

## 2015-02-24 DIAGNOSIS — Z880 Allergy status to penicillin: Secondary | ICD-10-CM | POA: Insufficient documentation

## 2015-02-24 DIAGNOSIS — J449 Chronic obstructive pulmonary disease, unspecified: Secondary | ICD-10-CM | POA: Insufficient documentation

## 2015-02-24 DIAGNOSIS — K219 Gastro-esophageal reflux disease without esophagitis: Secondary | ICD-10-CM | POA: Diagnosis not present

## 2015-02-24 DIAGNOSIS — Z882 Allergy status to sulfonamides status: Secondary | ICD-10-CM | POA: Insufficient documentation

## 2015-02-24 DIAGNOSIS — Z7982 Long term (current) use of aspirin: Secondary | ICD-10-CM | POA: Insufficient documentation

## 2015-02-24 DIAGNOSIS — Z87891 Personal history of nicotine dependence: Secondary | ICD-10-CM | POA: Insufficient documentation

## 2015-02-24 DIAGNOSIS — M109 Gout, unspecified: Secondary | ICD-10-CM | POA: Diagnosis not present

## 2015-02-24 DIAGNOSIS — Z7902 Long term (current) use of antithrombotics/antiplatelets: Secondary | ICD-10-CM | POA: Diagnosis not present

## 2015-02-24 DIAGNOSIS — Z886 Allergy status to analgesic agent status: Secondary | ICD-10-CM | POA: Diagnosis not present

## 2015-02-24 DIAGNOSIS — K2950 Unspecified chronic gastritis without bleeding: Secondary | ICD-10-CM | POA: Diagnosis not present

## 2015-02-24 DIAGNOSIS — I4891 Unspecified atrial fibrillation: Secondary | ICD-10-CM | POA: Insufficient documentation

## 2015-02-24 DIAGNOSIS — R1310 Dysphagia, unspecified: Secondary | ICD-10-CM | POA: Insufficient documentation

## 2015-02-24 DIAGNOSIS — I509 Heart failure, unspecified: Secondary | ICD-10-CM | POA: Insufficient documentation

## 2015-02-24 DIAGNOSIS — K3189 Other diseases of stomach and duodenum: Secondary | ICD-10-CM | POA: Diagnosis not present

## 2015-02-24 DIAGNOSIS — Z950 Presence of cardiac pacemaker: Secondary | ICD-10-CM | POA: Diagnosis not present

## 2015-02-24 DIAGNOSIS — K449 Diaphragmatic hernia without obstruction or gangrene: Secondary | ICD-10-CM | POA: Diagnosis not present

## 2015-02-24 DIAGNOSIS — I272 Other secondary pulmonary hypertension: Secondary | ICD-10-CM | POA: Insufficient documentation

## 2015-02-24 DIAGNOSIS — H409 Unspecified glaucoma: Secondary | ICD-10-CM | POA: Insufficient documentation

## 2015-02-24 DIAGNOSIS — Z91040 Latex allergy status: Secondary | ICD-10-CM | POA: Diagnosis not present

## 2015-02-24 DIAGNOSIS — Z888 Allergy status to other drugs, medicaments and biological substances status: Secondary | ICD-10-CM | POA: Insufficient documentation

## 2015-02-24 DIAGNOSIS — E039 Hypothyroidism, unspecified: Secondary | ICD-10-CM | POA: Insufficient documentation

## 2015-02-24 HISTORY — PX: ESOPHAGEAL DILATION: SHX303

## 2015-02-24 HISTORY — PX: ESOPHAGOGASTRODUODENOSCOPY: SHX5428

## 2015-02-24 SURGERY — EGD (ESOPHAGOGASTRODUODENOSCOPY)
Anesthesia: Moderate Sedation

## 2015-02-24 MED ORDER — MEPERIDINE HCL 50 MG/ML IJ SOLN
INTRAMUSCULAR | Status: AC
  Filled 2015-02-24: qty 1

## 2015-02-24 MED ORDER — SODIUM CHLORIDE 0.9 % IV SOLN
INTRAVENOUS | Status: DC
Start: 2015-02-24 — End: 2015-02-24
  Administered 2015-02-24: 10:00:00 via INTRAVENOUS

## 2015-02-24 MED ORDER — BUTAMBEN-TETRACAINE-BENZOCAINE 2-2-14 % EX AERO
INHALATION_SPRAY | CUTANEOUS | Status: DC | PRN
  Administered 2015-02-24: 2 via TOPICAL

## 2015-02-24 MED ORDER — MIDAZOLAM HCL 5 MG/5ML IJ SOLN
INTRAMUSCULAR | Status: DC | PRN
Start: 2015-02-24 — End: 2015-02-24
  Administered 2015-02-24 (×3): 1 mg via INTRAVENOUS

## 2015-02-24 MED ORDER — MIDAZOLAM HCL 5 MG/5ML IJ SOLN
INTRAMUSCULAR | Status: AC
  Filled 2015-02-24: qty 10

## 2015-02-24 MED ORDER — STERILE WATER FOR IRRIGATION IR SOLN
Status: DC | PRN
  Administered 2015-02-24: 10:00:00

## 2015-02-24 NOTE — Discharge Instructions (Signed)
Resume usual medications including clopidogrel and aspirin. Resume usual diet. Please call office with progress report in one week.  Gastrointestinal Endoscopy, Care After Refer to this sheet in the next few weeks. These instructions provide you with information on caring for yourself after your procedure. Your caregiver may also give you more specific instructions. Your treatment has been planned according to current medical practices, but problems sometimes occur. Call your caregiver if you have any problems or questions after your procedure. HOME CARE INSTRUCTIONS  If you were given medicine to help you relax (sedative), do not drive, operate machinery, or sign important documents for 24 hours.  Avoid alcohol and hot or warm beverages for the first 24 hours after the procedure.  Only take over-the-counter or prescription medicines for pain, discomfort, or fever as directed by your caregiver. You may resume taking your normal medicines unless your caregiver tells you otherwise. Ask your caregiver when you may resume taking medicines that may cause bleeding, such as aspirin, clopidogrel, or warfarin.  You may return to your normal diet and activities on the day after your procedure, or as directed by your caregiver. Walking may help to reduce any bloated feeling in your abdomen.  Drink enough fluids to keep your urine clear or pale yellow.  You may gargle with salt water if you have a sore throat. SEEK IMMEDIATE MEDICAL CARE IF:  You have severe nausea or vomiting.  You have severe abdominal pain, abdominal cramps that last longer than 6 hours, or abdominal swelling (distention).  You have severe shoulder or back pain.  You have trouble swallowing.  You have shortness of breath, your breathing is shallow, or you are breathing faster than normal.  You have a fever or a rapid heartbeat.  You vomit blood or material that looks like coffee grounds.  You have bloody, black, or tarry  stools. MAKE SURE YOU:  Understand these instructions.  Will watch your condition.  Will get help right away if you are not doing well or get worse. Document Released: 07/16/2004 Document Revised: 04/18/2014 Document Reviewed: 03/03/2012 [facility] Patient Information 2015 [place], [place]. This information is not intended to replace advice given to you by your health care provider. Make sure you discuss any questions you have with your health care provider.

## 2015-02-24 NOTE — H&P (Signed)
Dustin Blankenship is an 81 y.Dustin. male.   Chief Complaint: Patient is here for EGD and ED. HPI: Patient is 81 year old Caucasian male who presents with several month history of dysphagia both to solids as well as liquids. He has difficulty with solids particularly meats and points to mid sternum area as site of bolus obstruction. His wife has thought about giving him Heimlich maneuver but she has not so far. He also strength is on liquids. He has been evaluated by speech pathologist and doing exercises as recommended by Ms. Genene Churn SLP. He has history of GERD and previously has been on PPI but lately he has not been having any heartburn without the medication. Based on prior barium study is felt to have esophageal motility disorder. Patient has been off Plavix for 1 week.  Past Medical History  Diagnosis Date  . GERD (gastroesophageal reflux disease)   . COPD (chronic obstructive pulmonary disease)   . Glaucoma   . Hypothyroid   . Pacemaker 09/20/2010     ST Jude Accent DR RF device  MODEL #[id number],SERIAL #[id number]  . Glaucoma   . Pulmonary HTN     DUE TO CHRONIC HYPOXIA   FROM COPD  . CHF (congestive heart failure)     right heart failure and pulm htn due to chronic hypoxia from COPD  . Atrial fib/flutter, transient     not on warfarin due to risk of falls ,on aspirin for prophylaxis  . Sinus node dysfunction     Dual-chamber permanent pacermaker ST JUDE  . Gout     Past Surgical History  Procedure Laterality Date  . Doppler echocardiography  07/25/2011    EF =50-55%  . Nm myocar perf wall motion  07/25/2011  . Insert / replace / remove pacemaker  09/20/2010    ST JUDE ACCENT-dual chamber ;    . Lung biopsy    . Colonoscopy w/ polypectomy      History reviewed. No pertinent family history. Social History:  reports that he quit smoking about 61 years ago. His smoking use included Cigarettes. He has a 54 pack-year smoking history. He has never used smokeless tobacco. He reports  that he does not drink alcohol or use illicit drugs.  Allergies:  Allergies  Allergen Reactions  . Morphine And Related   . Multaq [Dronedarone]   . Penicillins Other (See Comments)    Unknown  . Latex Rash  . Sulfonamide Derivatives Rash  . Testosterone Rash    The cream form causes a rash when rubbed on skin.     Medications Prior to Admission  Medication Sig Dispense Refill  . allopurinol (ZYLOPRIM) 300 MG tablet Take 300 mg by mouth daily.    Marland Kitchen ALPRAZolam (XANAX) 0.5 MG tablet Take 0.5 mg by mouth at bedtime as needed. Sleep    . aspirin 325 MG EC tablet Take 325 mg by mouth daily.    . clopidogrel (PLAVIX) 75 MG tablet TAKE ONE TABLET BY MOUTH ONCE DAILY. 30 tablet 7  . donepezil (ARICEPT) 10 MG tablet Take 1 tablet by mouth daily.    Marland Kitchen LASTACAFT 0.25 % SOLN Place 1 drop into both eyes daily.     Marland Kitchen levothyroxine (SYNTHROID, LEVOTHROID) 125 MCG tablet Take 125 mcg by mouth daily.    Marland Kitchen LUMIGAN 0.03 % ophthalmic solution Place 1 drop into both eyes at bedtime.     [name] Glycol-Propyl Glycol (SYSTANE OP) Place 1 drop into both eyes daily.    Marland Kitchen saw  palmetto 500 MG capsule Take 500 mg by mouth daily.    . sotalol (BETAPACE) 80 MG tablet Take 40-80 mg by mouth 2 (two) times daily. Takes 40 mg in the morning and 80 mg in the evening.    . nitroGLYCERIN (NITROSTAT) 0.4 MG SL tablet Place 0.4 mg under the tongue every 5 (five) minutes as needed. Chest Pain      No results found for this or any previous visit (from the past 48 hour(s)). No results found.  ROS  Blood pressure 157/90, pulse 75, temperature 98.6 F (37 C), temperature source Oral, resp. rate 18, height _0  (1.753 m), weight 192 lb (87.091 kg), SpO2 93 %. Physical Exam  Constitutional:  Well-developed thin Caucasian male in NAD.  HENT:  Mouth/Throat: Oropharynx is clear and moist.  Right eyes covered with with eye patch.  Eyes: Conjunctivae are normal. No scleral icterus.  Neck: No thyromegaly present.   Cardiovascular: Normal rate, regular rhythm and normal heart sounds.   No murmur heard. Respiratory: Effort normal and breath sounds normal.  He has pacemaker in right pectoral region  GI: Soft. He exhibits no distension and no mass. There is no tenderness.  Musculoskeletal: He exhibits no edema.  Lymphadenopathy:    He has no cervical adenopathy.  Neurological: He is alert.  Skin: Skin is warm and dry.     Assessment/Plan Dysphagia primarily to solids. EGD with ED.   REHMAN,NAJEEB U 02/24/2015, 10:15 AM

## 2015-02-24 NOTE — Op Note (Addendum)
EGD PROCEDURE REPORT  PATIENT:  Dustin Blankenship  MR#:  [id number] Birthdate:  1920/01/16, 81 y.o., male Endoscopist:  Dr. [name], MD Referred By:  Dr. [name], MD  Procedure Date: 02/24/2015  Procedure:   EGD with ED  Indications:  Patient is 81 year old Caucasian male with multiple medical problems who has dysphagia to solids. He points to mid sternum area as site of bolus obstruction. Lately he has not experienced heartburn. He underwent esophageal dilation in August 2013 and did notice improvement is dysphagia for several months.           Informed Consent:  The risks, benefits, alternatives & imponderables which include, but are not limited to, bleeding, infection, perforation, drug reaction and potential missed lesion have been reviewed.  The potential for biopsy, lesion removal, esophageal dilation, etc. have also been discussed.  Questions have been answered.  All parties agreeable.  Please see history & physical in medical record for more information.  Medications:   Versed 3 mg IV Cetacaine spray topically for oropharyngeal anesthesia  Description of procedure:  The endoscope was introduced through the mouth and advanced to the second portion of the duodenum without difficulty or limitations. The mucosal surfaces were surveyed very carefully during advancement of the scope and upon withdrawal.  Findings:  Esophagus:  Mucosa of the esophagus was normal. GE junction was unremarkable without ring or stricture formation. GEJ:  40 cm Hiatus:  42 cm Stomach:  Stomach was empty and distended very well with insufflation. Folds in the proximal stomach were normal. Examination of mucosa at gastric body was normal. Few antral erosions present. No ulcer crater was noted. Pyloric channel was patent. Angularis fundus and cardia were examined by retroflex in the scope and were normal. Duodenum:  Normal bulbar and post bulbar mucosa.  Therapeutic/Diagnostic Maneuvers Performed:    Esophagus was dilated by passing 54 [place] Maloney dilator to full insertion. Esophageal mucosa was reexamined post dilation and no disruption noted.  Complications: None  Impression: Small sliding hiatal hernia without evidence of esophagitis ring or stricture formation. Few antral erosions possibly secondary to aspirin or clopidogrel. Esophagus dilated by passing 54 [place] Maloney dilator but no mucosal disruption induced.  Recommendations:  Standard instructions given. Patient will resume clopidogrel and aspirin as before. Patient on his wife will call with progress report in one week.  Dustin Blankenship  02/24/2015  10:42 AM  CC: Dr. [name], MD & Dr. Rayne Du ref. provider found

## 2015-02-27 ENCOUNTER — Encounter (HOSPITAL_COMMUNITY): Payer: Self-pay | Admitting: Internal Medicine

## 2015-02-28 DIAGNOSIS — I10 Essential (primary) hypertension: Secondary | ICD-10-CM | POA: Diagnosis not present

## 2015-02-28 DIAGNOSIS — I2510 Atherosclerotic heart disease of native coronary artery without angina pectoris: Secondary | ICD-10-CM | POA: Diagnosis not present

## 2015-02-28 DIAGNOSIS — N401 Enlarged prostate with lower urinary tract symptoms: Secondary | ICD-10-CM | POA: Diagnosis not present

## 2015-02-28 DIAGNOSIS — I509 Heart failure, unspecified: Secondary | ICD-10-CM | POA: Diagnosis not present

## 2015-02-28 DIAGNOSIS — I4891 Unspecified atrial fibrillation: Secondary | ICD-10-CM | POA: Diagnosis not present

## 2015-02-28 DIAGNOSIS — J449 Chronic obstructive pulmonary disease, unspecified: Secondary | ICD-10-CM | POA: Diagnosis not present

## 2015-03-13 ENCOUNTER — Encounter (INDEPENDENT_AMBULATORY_CARE_PROVIDER_SITE_OTHER): Payer: Self-pay | Admitting: Internal Medicine

## 2015-03-13 ENCOUNTER — Ambulatory Visit (INDEPENDENT_AMBULATORY_CARE_PROVIDER_SITE_OTHER): Payer: Medicare Other | Admitting: Internal Medicine

## 2015-03-13 VITALS — BP 130/74 | HR 76 | Temp 97.5°F | Resp 18 | Ht 69.7 in | Wt 194.7 lb

## 2015-03-13 DIAGNOSIS — R1314 Dysphagia, pharyngoesophageal phase: Secondary | ICD-10-CM

## 2015-03-13 DIAGNOSIS — R1310 Dysphagia, unspecified: Secondary | ICD-10-CM

## 2015-03-13 DIAGNOSIS — K117 Disturbances of salivary secretion: Secondary | ICD-10-CM

## 2015-03-13 DIAGNOSIS — R1319 Other dysphagia: Secondary | ICD-10-CM

## 2015-03-13 NOTE — Patient Instructions (Signed)
Referral to neurologist to be arranged after conferring with Dr. [name]

## 2015-03-13 NOTE — Progress Notes (Signed)
Presenting complaint;  Patient wants to know why he is drooling.  Subjective:  Patient is 81 year old Caucasian male whom I saw on 01/09/2015 for solid food dysphagia. He underwent EGD with ED on 02/24/2015. He has small sliding hiatal hernia and antral erosions but no evidence of esophageal stricture or ring. Esophagus was dilated by passing 44 [place]. Patient is here today for unscheduled visit and wants to know why he is drooling. He has never mentioned this symptom to me before. He states drooling started about 2 months ago and is always from the right side of his mouth. He is aware that there right ankle of the mouth slightly drooped. He was evaluated by speech pathologist but exercises have not helped. He never drools from the left side. He states his symptom is of great distress to him and he wants to get it fixed. He does not drool while he is eating. He denies right-sided facial pain. There is no history of Bell,s palsy, trigeminal neuralgia or CVA. He has not had head CT recently.   Current Medications: Outpatient Encounter Prescriptions as of 03/13/2015  Medication Sig  . allopurinol (ZYLOPRIM) 300 MG tablet Take 300 mg by mouth daily.  Marland Kitchen escitalopram (LEXAPRO) 10 MG tablet Take 10 mg by mouth at bedtime.   Marland Kitchen Ketotifen Fumarate (THERA TEARS ALLERGY OP) Apply to eye. 3 drops in both eyes at least once a day per patient wife.  Marland Kitchen LASTACAFT 0.25 % SOLN Place 1 drop into both eyes daily.   Marland Kitchen levothyroxine (SYNTHROID, LEVOTHROID) 125 MCG tablet Take 125 mcg by mouth daily.  Marland Kitchen LUMIGAN 0.03 % ophthalmic solution Place 1 drop into both eyes at bedtime.   . nitroGLYCERIN (NITROSTAT) 0.4 MG SL tablet Place 0.4 mg under the tongue every 5 (five) minutes as needed. Chest Pain  . saw palmetto 500 MG capsule Take 500 mg by mouth daily.  . sotalol (BETAPACE) 80 MG tablet Take 40-80 mg by mouth 2 (two) times daily. Takes 40 mg in the morning and 80 mg in the evening.  . traZODone (DESYREL) 50 MG  tablet Take 50 mg by mouth daily as needed.   . ALPRAZolam (XANAX) 0.5 MG tablet Take 0.5 mg by mouth at bedtime as needed. Sleep  . donepezil (ARICEPT) 10 MG tablet Take 1 tablet by mouth daily.  . [DISCONTINUED] aspirin 325 MG EC tablet Take 325 mg by mouth daily.  . [DISCONTINUED] clopidogrel (PLAVIX) 75 MG tablet TAKE ONE TABLET BY MOUTH ONCE DAILY. (Patient not taking: Reported on 03/13/2015)  . [DISCONTINUED] Polyethyl Glycol-Propyl Glycol (SYSTANE OP) Place 1 drop into both eyes daily.     Objective: Blood pressure 130/74, pulse 76, temperature 97.5 F (36.4 C), temperature source Oral, resp. rate 18, height 5' 9.7" (1.77 m), weight 194 lb 11.2 oz (88.315 kg). Patient is alert and in no acute distress. He appears angry and upset. There is slight drooping of right and left mouth. There is no difference in tactile and pain sensation between left and right side of his face. No weakness noted to right forehand, eyelids or lower half of the face. Right eye is covered with eye pad. Conjunctiva is pink. Sclera is nonicteric Oropharyngeal mucosa is normal. No neck masses or thyromegaly noted. He has normal grip in both hands. Strength is normal in proximal upper extremities as well as in both lower extremities. No LE edema or clubbing noted.   Assessment:  #1. Drooling of two months duration unresponsive to oro-facial exercises under supervision of  speech pathologist. He has slight drooping of right angle of his mouth but he does not have evidence of sensory or motor dysfunction involving the right side of his face. Patient is extremely anxious and wants definite diagnosis.  Since drooling is always right-sided muscular or neuromuscular etiology remains in differential diagnosis. Therefore will refer for neurologic evaluation as discussed with Dr. [name]. #2. Dysphagia secondary to esophageal motility disorder. He is 75-80% better with esophageal dilation.    Plan:  As discussed  with Dr. [name] over the phone will refer patient for neurologic consultation to rule out neuromuscular disorder to account for right-sided drooling. Patient given copy of recent EGD note for his records.

## 2015-03-14 DIAGNOSIS — N401 Enlarged prostate with lower urinary tract symptoms: Secondary | ICD-10-CM | POA: Diagnosis not present

## 2015-03-14 DIAGNOSIS — I4891 Unspecified atrial fibrillation: Secondary | ICD-10-CM | POA: Diagnosis not present

## 2015-03-14 DIAGNOSIS — I2510 Atherosclerotic heart disease of native coronary artery without angina pectoris: Secondary | ICD-10-CM | POA: Diagnosis not present

## 2015-03-14 DIAGNOSIS — I509 Heart failure, unspecified: Secondary | ICD-10-CM | POA: Diagnosis not present

## 2015-03-14 DIAGNOSIS — I10 Essential (primary) hypertension: Secondary | ICD-10-CM | POA: Diagnosis not present

## 2015-03-14 DIAGNOSIS — J449 Chronic obstructive pulmonary disease, unspecified: Secondary | ICD-10-CM | POA: Diagnosis not present

## 2015-03-24 ENCOUNTER — Ambulatory Visit (INDEPENDENT_AMBULATORY_CARE_PROVIDER_SITE_OTHER): Payer: Medicare Other | Admitting: Diagnostic Neuroimaging

## 2015-03-24 ENCOUNTER — Encounter: Payer: Self-pay | Admitting: Diagnostic Neuroimaging

## 2015-03-24 VITALS — BP 136/87 | HR 75 | Ht 67.0 in

## 2015-03-24 DIAGNOSIS — R29810 Facial weakness: Secondary | ICD-10-CM | POA: Diagnosis not present

## 2015-03-24 DIAGNOSIS — I639 Cerebral infarction, unspecified: Secondary | ICD-10-CM | POA: Diagnosis not present

## 2015-03-24 NOTE — Patient Instructions (Signed)
Continue current medications.

## 2015-03-24 NOTE — Progress Notes (Signed)
[facility]  PATIENT: Dustin Blankenship DOB: 1920/07/16  REFERRING CLINICIAN: Rehman HISTORY FROM: patient and wife  REASON FOR VISIT: new consult   HISTORICAL  CHIEF COMPLAINT:  Chief Complaint  Patient presents with  . New Evaluation    right sided drooling     HISTORY OF PRESENT ILLNESS:   UPDATE 03/24/15: Patient returns for new consultation of new problem. 9012 months ago patient had onset of right lower facial drooping. He also noticed some swallowing problems, drooling from the right side. Patient had esophageal stricture dilation with improvement in some swallowing function. He still has right lower facial weakness and sensation of saliva coming out of the corners of his mouth. This is not visible to other people but he feels it and consequently went to his mouth. Patient also having problems with easy bruising, now off of Plavix. He still has paroxysmal atrial fibrillation, not on anticoagulation or antiplatelet therapy (due to advanced age, fall risk and easy bruising). Patient denies unilateral numbness, weakness in the arms or legs, language difficulty.  PRIOR HPI (03/14/14): 81 year old male here for evaluation dementia. Patient carefully describes his entire past medical history, surgeries and conditions to May he also mentions severe life stressors including his wife having a stroke in October 2014 and some family difficulties related to his son and power of attorney assignment. Patient states that these stressors have led to his short-term memory problems. Patient does not think he has severe memory issues. Patient caregiver is here and has noticed a slowly progressive problem with short-term memory issues, confusion, paranoia, behavior changes. In her opinion symptoms were gradually progressive prior to the patient's wife's stroke. Since that time there is more segment decline. Patient has not rebounded since that time. Patient is on donepezil currently without  significant benefit. Patient feels some subjective memory improvement on donepezil.  Patient was at home with assistance from caregiver. Patient's wife are planning to move to assisted-living next month.    REVIEW OF SYSTEMS: Full 14 system review of systems performed and notable only for memory loss distally swelling depression anxiety not asleep allergies runny nose joint pain joint swelling trouble swallowing itching swelling in legs cough shortness of breath loss of vision eye pain easy bruising fatigue.   ALLERGIES: Allergies  Allergen Reactions  . Morphine And Related   . Multaq [Dronedarone]   . Penicillins Other (See Comments)    Unknown  . Latex Rash  . Sulfonamide Derivatives Rash  . Testosterone Rash    The cream form causes a rash when rubbed on skin.     HOME MEDICATIONS: Outpatient Prescriptions Prior to Visit  Medication Sig Dispense Refill  . allopurinol (ZYLOPRIM) 300 MG tablet Take 300 mg by mouth daily.    Marland Kitchen donepezil (ARICEPT) 10 MG tablet Take 1 tablet by mouth daily.    Marland Kitchen escitalopram (LEXAPRO) 10 MG tablet Take 10 mg by mouth at bedtime.     Marland Kitchen Ketotifen Fumarate (THERA TEARS ALLERGY OP) Apply to eye. 3 drops in both eyes at least once a day per patient wife.    . levothyroxine (SYNTHROID, LEVOTHROID) 125 MCG tablet Take 125 mcg by mouth daily.    . saw palmetto 500 MG capsule Take 500 mg by mouth daily.    . sotalol (BETAPACE) 80 MG tablet Take 40-80 mg by mouth 2 (two) times daily. Takes 40 mg in the morning and 80 mg in the evening.    . nitroGLYCERIN (NITROSTAT) 0.4 MG SL tablet Place  0.4 mg under the tongue every 5 (five) minutes as needed. Chest Pain    . ALPRAZolam (XANAX) 0.5 MG tablet Take 0.5 mg by mouth at bedtime as needed. Sleep    . LASTACAFT 0.25 % SOLN Place 1 drop into both eyes daily.     Marland Kitchen LUMIGAN 0.03 % ophthalmic solution Place 1 drop into both eyes at bedtime.     . traZODone (DESYREL) 50 MG tablet Take 50 mg by mouth daily as needed.        No facility-administered medications prior to visit.    PAST MEDICAL HISTORY: Past Medical History  Diagnosis Date  . GERD (gastroesophageal reflux disease)   . COPD (chronic obstructive pulmonary disease)   . Glaucoma   . Hypothyroid   . Pacemaker 09/20/2010     ST Jude Accent DR RF device  MODEL #PN2210,SERIAL #[id number]  . Glaucoma   . Pulmonary HTN     DUE TO CHRONIC HYPOXIA   FROM COPD  . CHF (congestive heart failure)     right heart failure and pulm htn due to chronic hypoxia from COPD  . Atrial fib/flutter, transient     not on warfarin due to risk of falls ,on aspirin for prophylaxis  . Sinus node dysfunction     Dual-chamber permanent pacermaker ST JUDE  . Gout     PAST SURGICAL HISTORY: Past Surgical History  Procedure Laterality Date  . Doppler echocardiography  07/25/2011    EF =50-55%  . Nm myocar perf wall motion  07/25/2011  . Insert / replace / remove pacemaker  09/20/2010    ST JUDE ACCENT-dual chamber ;    . Lung biopsy    . Colonoscopy w/ polypectomy    . Esophagogastroduodenoscopy N/A 02/24/2015    Procedure: ESOPHAGOGASTRODUODENOSCOPY (EGD);  Surgeon: [name], MD;  Location: [facility];  Service: Endoscopy;  Laterality: N/A;  10:00  . Esophageal dilation N/A 02/24/2015    Procedure: ESOPHAGEAL DILATION;  Surgeon: [name], MD;  Location: [facility];  Service: Endoscopy;  Laterality: N/A;    FAMILY HISTORY: History reviewed. No pertinent family history.  SOCIAL HISTORY:  History   Social History  . Marital Status: Married    Spouse Name: Dustin Blankenship  . Number of Children: 3  . Years of Education: College   Occupational History  . Retired    Social History Main Topics  . Smoking status: Former Smoker -- 3.00 packs/day for 18 years    Types: Cigarettes    Quit date: 12/16/1953  . Smokeless tobacco: Never Used  . Alcohol Use: No  . Drug Use: No  . Sexual Activity: Not on file   Other Topics Concern  . Not on file    Social History Narrative   Patient lives at home with spouse currently.   Caffeine Use: 1 cup of coffee in a.m and black tea in the afternoon     PHYSICAL EXAM  Filed Vitals:   03/24/15 1036  BP: 136/87  Pulse: 75  Height: _0  (1.702 m)    Not recorded      Body mass index is 0.00 kg/(m^2).  GENERAL EXAM: Patient is in no distress; well developed, nourished and groomed; neck is supple; elderly; EXTENSIVE BRUSING IN LEFT ARM AND LEG LEG FROM RECENT FALL  CARDIOVASCULAR: Regular rate and rhythm, no murmurs, no carotid bruits  NEUROLOGIC: MENTAL STATUS: awake, alert, oriented to person, place and time; REGISTERS 3/3; RECALLS 0/3. MISSES 1 FOR INSTRUCTIONS. Normal  attention and concentration, language fluent, comprehension intact, naming intact, fund of knowledge appropriate; MMSE 26/30. NO FRONTAL RELEASE SIGNS. CRANIAL NERVE: DIFFICULT TO VISUALIZE FUNDI; RIGHT PUPIL POST-SURG/CLOUDY NO REACTION. LEFT PUPIL MIN RXN. visual fields full to confrontation, extraocular muscles intact, no nystagmus, facial sensation symmetric, DECR RIGHT NASOLABIAL FOLD AND RIGHT LOWER FACIAL DROOP AT REST; SYMM SMILE ON EXERTION; EYEBROWS SYMM; EYE CLOSURE SYMM; hearing DECR; palate elevates symmetrically, uvula midline, shoulder shrug symmetric, tongue midline. HOARSE VOICE MOTOR: normal bulk and tone, full strength in the BUE, BLE SENSORY: normal and symmetric to light touch COORDINATION: finger-nose-finger, fine finger movements normal REFLEXES: deep tendon reflexes trace and symmetric GAIT/STATION: narrow based gait; CAUTIOUS, USES SINGLE POINT CANE     DIAGNOSTIC DATA (LABS, IMAGING, TESTING) - I reviewed patient records, labs, notes, testing and imaging myself where available.  Lab Results  Component Value Date   WBC 15.0* 05/16/2011   HGB 12.6* 05/16/2011   HCT 39.6 05/16/2011   MCV 91.2 05/16/2011   PLT 307 05/16/2011      Component Value Date/Time   NA 140 05/02/2014 1141    K 3.9 05/02/2014 1141   CL 102 05/02/2014 1141   CO2 28 05/02/2014 1141   GLUCOSE 88 05/02/2014 1141   BUN 26* 05/02/2014 1141   CREATININE 1.02 05/02/2014 1141   CREATININE 1.08 05/16/2011 0554   CALCIUM 9.2 05/02/2014 1141   PROT 5.8* 10/19/2010 0345   ALBUMIN 3.1* 10/19/2010 0345   AST 15 10/19/2010 0345   ALT 15 10/19/2010 0345   ALKPHOS 39 10/19/2010 0345   BILITOT 0.5 10/19/2010 0345   GFRNONAA >60 05/16/2011 0554   GFRAA  05/16/2011 0554    >60        The eGFR has been calculated using the MDRD equation. This calculation has not been validated in all clinical situations. eGFR's persistently <60 mL/min signify possible Chronic Kidney Disease.   Lab Results  Component Value Date   CHOL  06/04/2010    107        ATP III CLASSIFICATION:  <200     mg/dL   Desirable  200-239  mg/dL   Borderline High  >=240    mg/dL   High          HDL 31* 06/04/2010   LDLCALC  06/04/2010    55        Total Cholesterol/HDL:CHD Risk Coronary Heart Disease Risk Table                     Men   Women  1/2 Average Risk   3.4   3.3  Average Risk       5.0   4.4  2 X Average Risk   9.6   7.1  3 X Average Risk  23.4   11.0        Use the calculated Patient Ratio above and the CHD Risk Table to determine the patient's CHD Risk.        ATP III CLASSIFICATION (LDL):  <100     mg/dL   Optimal  100-129  mg/dL   Near or Above                    Optimal  130-159  mg/dL   Borderline  160-189  mg/dL   High  >190     mg/dL   Very High   TRIG 107 06/04/2010   CHOLHDL 3.5 06/04/2010   Lab Results  Component Value Date  HGBA1C * 10/19/2010    6.6 (NOTE)                                                                       According to the ADA Clinical Practice Recommendations for 2011, when HbA1c is used as a screening test:   >=6.5%   Diagnostic of Diabetes Mellitus           (if abnormal result  is confirmed)  5.7-6.4%   Increased risk of developing Diabetes Mellitus   References:Diagnosis and Classification of Diabetes Mellitus,Diabetes YOMA,0045,99(HFSFS 1):S62-S69 and Standards of Medical Care in         Diabetes - 2011,Diabetes ELTR,3202,33  (Suppl 1):S11-S61.   No results found for: VITAMINB12 Lab Results  Component Value Date   TSH 1.274 10/18/2010      ASSESSMENT AND PLAN  81 y.o. year old male here with  progressive short-term memory problems. May represent mild cognitive impairment versus early dementia. Symptoms stable/slightly progressing. Ddx: MCI vs mild dementia (alzheimer's).  Now with new onset right lower facial weakness since past 9-12 months, like represents small ischemic infarction (left brain subcortical).   PLAN: - No further testing or treatment advised given advanced age, multiple co-morbid medical issues and polypharmacy. Patient agrees with plan.  Return for return to PCP.   Penni Bombard, MD 03/18/5685, 16:83 AM Certified in Neurology, Neurophysiology and Neuroimaging  [facility] [address], [place], [place] [address] [phone]

## 2015-03-28 DIAGNOSIS — J449 Chronic obstructive pulmonary disease, unspecified: Secondary | ICD-10-CM | POA: Diagnosis not present

## 2015-03-28 DIAGNOSIS — I4891 Unspecified atrial fibrillation: Secondary | ICD-10-CM | POA: Diagnosis not present

## 2015-03-28 DIAGNOSIS — I10 Essential (primary) hypertension: Secondary | ICD-10-CM | POA: Diagnosis not present

## 2015-03-28 DIAGNOSIS — I509 Heart failure, unspecified: Secondary | ICD-10-CM | POA: Diagnosis not present

## 2015-03-28 DIAGNOSIS — I2510 Atherosclerotic heart disease of native coronary artery without angina pectoris: Secondary | ICD-10-CM | POA: Diagnosis not present

## 2015-03-28 DIAGNOSIS — N401 Enlarged prostate with lower urinary tract symptoms: Secondary | ICD-10-CM | POA: Diagnosis not present

## 2015-03-29 DIAGNOSIS — M25569 Pain in unspecified knee: Secondary | ICD-10-CM | POA: Diagnosis not present

## 2015-03-29 DIAGNOSIS — M25559 Pain in unspecified hip: Secondary | ICD-10-CM | POA: Diagnosis not present

## 2015-03-30 DIAGNOSIS — I4891 Unspecified atrial fibrillation: Secondary | ICD-10-CM | POA: Diagnosis not present

## 2015-03-30 DIAGNOSIS — I10 Essential (primary) hypertension: Secondary | ICD-10-CM | POA: Diagnosis not present

## 2015-03-30 DIAGNOSIS — J449 Chronic obstructive pulmonary disease, unspecified: Secondary | ICD-10-CM | POA: Diagnosis not present

## 2015-03-30 DIAGNOSIS — N401 Enlarged prostate with lower urinary tract symptoms: Secondary | ICD-10-CM | POA: Diagnosis not present

## 2015-03-30 DIAGNOSIS — I509 Heart failure, unspecified: Secondary | ICD-10-CM | POA: Diagnosis not present

## 2015-03-30 DIAGNOSIS — I2510 Atherosclerotic heart disease of native coronary artery without angina pectoris: Secondary | ICD-10-CM | POA: Diagnosis not present

## 2015-04-04 DIAGNOSIS — I509 Heart failure, unspecified: Secondary | ICD-10-CM | POA: Diagnosis not present

## 2015-04-04 DIAGNOSIS — I2510 Atherosclerotic heart disease of native coronary artery without angina pectoris: Secondary | ICD-10-CM | POA: Diagnosis not present

## 2015-04-04 DIAGNOSIS — J449 Chronic obstructive pulmonary disease, unspecified: Secondary | ICD-10-CM | POA: Diagnosis not present

## 2015-04-04 DIAGNOSIS — I10 Essential (primary) hypertension: Secondary | ICD-10-CM | POA: Diagnosis not present

## 2015-04-04 DIAGNOSIS — I4891 Unspecified atrial fibrillation: Secondary | ICD-10-CM | POA: Diagnosis not present

## 2015-04-04 DIAGNOSIS — N401 Enlarged prostate with lower urinary tract symptoms: Secondary | ICD-10-CM | POA: Diagnosis not present

## 2015-04-06 DIAGNOSIS — I2510 Atherosclerotic heart disease of native coronary artery without angina pectoris: Secondary | ICD-10-CM | POA: Diagnosis not present

## 2015-04-06 DIAGNOSIS — I509 Heart failure, unspecified: Secondary | ICD-10-CM | POA: Diagnosis not present

## 2015-04-06 DIAGNOSIS — N401 Enlarged prostate with lower urinary tract symptoms: Secondary | ICD-10-CM | POA: Diagnosis not present

## 2015-04-06 DIAGNOSIS — I4891 Unspecified atrial fibrillation: Secondary | ICD-10-CM | POA: Diagnosis not present

## 2015-04-06 DIAGNOSIS — J449 Chronic obstructive pulmonary disease, unspecified: Secondary | ICD-10-CM | POA: Diagnosis not present

## 2015-04-06 DIAGNOSIS — I10 Essential (primary) hypertension: Secondary | ICD-10-CM | POA: Diagnosis not present

## 2015-04-07 DIAGNOSIS — I10 Essential (primary) hypertension: Secondary | ICD-10-CM | POA: Diagnosis not present

## 2015-04-07 DIAGNOSIS — N401 Enlarged prostate with lower urinary tract symptoms: Secondary | ICD-10-CM | POA: Diagnosis not present

## 2015-04-07 DIAGNOSIS — I4891 Unspecified atrial fibrillation: Secondary | ICD-10-CM | POA: Diagnosis not present

## 2015-04-07 DIAGNOSIS — J449 Chronic obstructive pulmonary disease, unspecified: Secondary | ICD-10-CM | POA: Diagnosis not present

## 2015-04-07 DIAGNOSIS — I2510 Atherosclerotic heart disease of native coronary artery without angina pectoris: Secondary | ICD-10-CM | POA: Diagnosis not present

## 2015-04-07 DIAGNOSIS — I509 Heart failure, unspecified: Secondary | ICD-10-CM | POA: Diagnosis not present

## 2015-04-10 ENCOUNTER — Ambulatory Visit (HOSPITAL_COMMUNITY): Payer: Self-pay | Admitting: Psychology

## 2015-04-10 DIAGNOSIS — I4891 Unspecified atrial fibrillation: Secondary | ICD-10-CM | POA: Diagnosis not present

## 2015-04-10 DIAGNOSIS — I2510 Atherosclerotic heart disease of native coronary artery without angina pectoris: Secondary | ICD-10-CM | POA: Diagnosis not present

## 2015-04-10 DIAGNOSIS — N401 Enlarged prostate with lower urinary tract symptoms: Secondary | ICD-10-CM | POA: Diagnosis not present

## 2015-04-10 DIAGNOSIS — H00025 Hordeolum internum left lower eyelid: Secondary | ICD-10-CM | POA: Diagnosis not present

## 2015-04-10 DIAGNOSIS — I10 Essential (primary) hypertension: Secondary | ICD-10-CM | POA: Diagnosis not present

## 2015-04-10 DIAGNOSIS — I509 Heart failure, unspecified: Secondary | ICD-10-CM | POA: Diagnosis not present

## 2015-04-10 DIAGNOSIS — J449 Chronic obstructive pulmonary disease, unspecified: Secondary | ICD-10-CM | POA: Diagnosis not present

## 2015-04-13 DIAGNOSIS — I10 Essential (primary) hypertension: Secondary | ICD-10-CM | POA: Diagnosis not present

## 2015-04-13 DIAGNOSIS — I2510 Atherosclerotic heart disease of native coronary artery without angina pectoris: Secondary | ICD-10-CM | POA: Diagnosis not present

## 2015-04-13 DIAGNOSIS — I509 Heart failure, unspecified: Secondary | ICD-10-CM | POA: Diagnosis not present

## 2015-04-13 DIAGNOSIS — J449 Chronic obstructive pulmonary disease, unspecified: Secondary | ICD-10-CM | POA: Diagnosis not present

## 2015-04-13 DIAGNOSIS — I4891 Unspecified atrial fibrillation: Secondary | ICD-10-CM | POA: Diagnosis not present

## 2015-04-13 DIAGNOSIS — N401 Enlarged prostate with lower urinary tract symptoms: Secondary | ICD-10-CM | POA: Diagnosis not present

## 2015-04-17 DIAGNOSIS — R260 Ataxic gait: Secondary | ICD-10-CM | POA: Diagnosis not present

## 2015-04-17 DIAGNOSIS — I10 Essential (primary) hypertension: Secondary | ICD-10-CM | POA: Diagnosis not present

## 2015-04-17 DIAGNOSIS — Z9181 History of falling: Secondary | ICD-10-CM | POA: Diagnosis not present

## 2015-04-17 DIAGNOSIS — I509 Heart failure, unspecified: Secondary | ICD-10-CM | POA: Diagnosis not present

## 2015-04-17 DIAGNOSIS — Z9981 Dependence on supplemental oxygen: Secondary | ICD-10-CM | POA: Diagnosis not present

## 2015-04-17 DIAGNOSIS — F329 Major depressive disorder, single episode, unspecified: Secondary | ICD-10-CM | POA: Diagnosis not present

## 2015-04-17 DIAGNOSIS — M1990 Unspecified osteoarthritis, unspecified site: Secondary | ICD-10-CM | POA: Diagnosis not present

## 2015-04-17 DIAGNOSIS — J449 Chronic obstructive pulmonary disease, unspecified: Secondary | ICD-10-CM | POA: Diagnosis not present

## 2015-04-17 DIAGNOSIS — Z950 Presence of cardiac pacemaker: Secondary | ICD-10-CM | POA: Diagnosis not present

## 2015-04-17 DIAGNOSIS — H409 Unspecified glaucoma: Secondary | ICD-10-CM | POA: Diagnosis not present

## 2015-04-17 DIAGNOSIS — M542 Cervicalgia: Secondary | ICD-10-CM | POA: Diagnosis not present

## 2015-04-17 DIAGNOSIS — I4891 Unspecified atrial fibrillation: Secondary | ICD-10-CM | POA: Diagnosis not present

## 2015-04-17 DIAGNOSIS — I2510 Atherosclerotic heart disease of native coronary artery without angina pectoris: Secondary | ICD-10-CM | POA: Diagnosis not present

## 2015-04-17 DIAGNOSIS — M6281 Muscle weakness (generalized): Secondary | ICD-10-CM | POA: Diagnosis not present

## 2015-04-18 DIAGNOSIS — M542 Cervicalgia: Secondary | ICD-10-CM | POA: Diagnosis not present

## 2015-04-18 DIAGNOSIS — M1990 Unspecified osteoarthritis, unspecified site: Secondary | ICD-10-CM | POA: Diagnosis not present

## 2015-04-18 DIAGNOSIS — I509 Heart failure, unspecified: Secondary | ICD-10-CM | POA: Diagnosis not present

## 2015-04-18 DIAGNOSIS — M6281 Muscle weakness (generalized): Secondary | ICD-10-CM | POA: Diagnosis not present

## 2015-04-18 DIAGNOSIS — I10 Essential (primary) hypertension: Secondary | ICD-10-CM | POA: Diagnosis not present

## 2015-04-18 DIAGNOSIS — J449 Chronic obstructive pulmonary disease, unspecified: Secondary | ICD-10-CM | POA: Diagnosis not present

## 2015-04-20 DIAGNOSIS — M542 Cervicalgia: Secondary | ICD-10-CM | POA: Diagnosis not present

## 2015-04-20 DIAGNOSIS — J449 Chronic obstructive pulmonary disease, unspecified: Secondary | ICD-10-CM | POA: Diagnosis not present

## 2015-04-20 DIAGNOSIS — M1990 Unspecified osteoarthritis, unspecified site: Secondary | ICD-10-CM | POA: Diagnosis not present

## 2015-04-20 DIAGNOSIS — I10 Essential (primary) hypertension: Secondary | ICD-10-CM | POA: Diagnosis not present

## 2015-04-20 DIAGNOSIS — M6281 Muscle weakness (generalized): Secondary | ICD-10-CM | POA: Diagnosis not present

## 2015-04-20 DIAGNOSIS — I509 Heart failure, unspecified: Secondary | ICD-10-CM | POA: Diagnosis not present

## 2015-04-24 DIAGNOSIS — N401 Enlarged prostate with lower urinary tract symptoms: Secondary | ICD-10-CM | POA: Diagnosis not present

## 2015-04-24 DIAGNOSIS — R233 Spontaneous ecchymoses: Secondary | ICD-10-CM | POA: Diagnosis not present

## 2015-04-24 DIAGNOSIS — J449 Chronic obstructive pulmonary disease, unspecified: Secondary | ICD-10-CM | POA: Diagnosis not present

## 2015-04-24 DIAGNOSIS — I2510 Atherosclerotic heart disease of native coronary artery without angina pectoris: Secondary | ICD-10-CM | POA: Diagnosis not present

## 2015-04-25 DIAGNOSIS — J449 Chronic obstructive pulmonary disease, unspecified: Secondary | ICD-10-CM | POA: Diagnosis not present

## 2015-04-25 DIAGNOSIS — I10 Essential (primary) hypertension: Secondary | ICD-10-CM | POA: Diagnosis not present

## 2015-04-25 DIAGNOSIS — M1990 Unspecified osteoarthritis, unspecified site: Secondary | ICD-10-CM | POA: Diagnosis not present

## 2015-04-25 DIAGNOSIS — I509 Heart failure, unspecified: Secondary | ICD-10-CM | POA: Diagnosis not present

## 2015-04-25 DIAGNOSIS — M6281 Muscle weakness (generalized): Secondary | ICD-10-CM | POA: Diagnosis not present

## 2015-04-25 DIAGNOSIS — M542 Cervicalgia: Secondary | ICD-10-CM | POA: Diagnosis not present

## 2015-04-26 DIAGNOSIS — Z961 Presence of intraocular lens: Secondary | ICD-10-CM | POA: Diagnosis not present

## 2015-04-26 DIAGNOSIS — H5711 Ocular pain, right eye: Secondary | ICD-10-CM | POA: Diagnosis not present

## 2015-04-26 DIAGNOSIS — H5441 Blindness, right eye, normal vision left eye: Secondary | ICD-10-CM | POA: Diagnosis not present

## 2015-04-27 DIAGNOSIS — J449 Chronic obstructive pulmonary disease, unspecified: Secondary | ICD-10-CM | POA: Diagnosis not present

## 2015-04-27 DIAGNOSIS — I10 Essential (primary) hypertension: Secondary | ICD-10-CM | POA: Diagnosis not present

## 2015-04-27 DIAGNOSIS — M1990 Unspecified osteoarthritis, unspecified site: Secondary | ICD-10-CM | POA: Diagnosis not present

## 2015-04-27 DIAGNOSIS — I509 Heart failure, unspecified: Secondary | ICD-10-CM | POA: Diagnosis not present

## 2015-04-27 DIAGNOSIS — M542 Cervicalgia: Secondary | ICD-10-CM | POA: Diagnosis not present

## 2015-04-27 DIAGNOSIS — D649 Anemia, unspecified: Secondary | ICD-10-CM | POA: Diagnosis not present

## 2015-04-27 DIAGNOSIS — M6281 Muscle weakness (generalized): Secondary | ICD-10-CM | POA: Diagnosis not present

## 2015-04-28 DIAGNOSIS — I509 Heart failure, unspecified: Secondary | ICD-10-CM | POA: Diagnosis not present

## 2015-04-28 DIAGNOSIS — M6281 Muscle weakness (generalized): Secondary | ICD-10-CM | POA: Diagnosis not present

## 2015-04-28 DIAGNOSIS — D649 Anemia, unspecified: Secondary | ICD-10-CM | POA: Diagnosis not present

## 2015-04-28 DIAGNOSIS — I10 Essential (primary) hypertension: Secondary | ICD-10-CM | POA: Diagnosis not present

## 2015-04-28 DIAGNOSIS — J449 Chronic obstructive pulmonary disease, unspecified: Secondary | ICD-10-CM | POA: Diagnosis not present

## 2015-04-28 DIAGNOSIS — M542 Cervicalgia: Secondary | ICD-10-CM | POA: Diagnosis not present

## 2015-04-28 DIAGNOSIS — M1990 Unspecified osteoarthritis, unspecified site: Secondary | ICD-10-CM | POA: Diagnosis not present

## 2015-05-03 DIAGNOSIS — I509 Heart failure, unspecified: Secondary | ICD-10-CM | POA: Diagnosis not present

## 2015-05-03 DIAGNOSIS — M1990 Unspecified osteoarthritis, unspecified site: Secondary | ICD-10-CM | POA: Diagnosis not present

## 2015-05-03 DIAGNOSIS — J449 Chronic obstructive pulmonary disease, unspecified: Secondary | ICD-10-CM | POA: Diagnosis not present

## 2015-05-03 DIAGNOSIS — I10 Essential (primary) hypertension: Secondary | ICD-10-CM | POA: Diagnosis not present

## 2015-05-03 DIAGNOSIS — M6281 Muscle weakness (generalized): Secondary | ICD-10-CM | POA: Diagnosis not present

## 2015-05-03 DIAGNOSIS — M542 Cervicalgia: Secondary | ICD-10-CM | POA: Diagnosis not present

## 2015-05-09 DIAGNOSIS — I10 Essential (primary) hypertension: Secondary | ICD-10-CM | POA: Diagnosis not present

## 2015-05-09 DIAGNOSIS — M6281 Muscle weakness (generalized): Secondary | ICD-10-CM | POA: Diagnosis not present

## 2015-05-09 DIAGNOSIS — I509 Heart failure, unspecified: Secondary | ICD-10-CM | POA: Diagnosis not present

## 2015-05-09 DIAGNOSIS — M542 Cervicalgia: Secondary | ICD-10-CM | POA: Diagnosis not present

## 2015-05-09 DIAGNOSIS — M1990 Unspecified osteoarthritis, unspecified site: Secondary | ICD-10-CM | POA: Diagnosis not present

## 2015-05-09 DIAGNOSIS — J449 Chronic obstructive pulmonary disease, unspecified: Secondary | ICD-10-CM | POA: Diagnosis not present

## 2015-05-10 DIAGNOSIS — I509 Heart failure, unspecified: Secondary | ICD-10-CM | POA: Diagnosis not present

## 2015-05-10 DIAGNOSIS — I10 Essential (primary) hypertension: Secondary | ICD-10-CM | POA: Diagnosis not present

## 2015-05-10 DIAGNOSIS — M6281 Muscle weakness (generalized): Secondary | ICD-10-CM | POA: Diagnosis not present

## 2015-05-10 DIAGNOSIS — M542 Cervicalgia: Secondary | ICD-10-CM | POA: Diagnosis not present

## 2015-05-10 DIAGNOSIS — M1990 Unspecified osteoarthritis, unspecified site: Secondary | ICD-10-CM | POA: Diagnosis not present

## 2015-05-10 DIAGNOSIS — J449 Chronic obstructive pulmonary disease, unspecified: Secondary | ICD-10-CM | POA: Diagnosis not present

## 2015-05-12 DIAGNOSIS — H04123 Dry eye syndrome of bilateral lacrimal glands: Secondary | ICD-10-CM | POA: Diagnosis not present

## 2015-05-12 DIAGNOSIS — Z961 Presence of intraocular lens: Secondary | ICD-10-CM | POA: Diagnosis not present

## 2015-05-12 DIAGNOSIS — H409 Unspecified glaucoma: Secondary | ICD-10-CM | POA: Diagnosis not present

## 2015-05-12 DIAGNOSIS — H18413 Arcus senilis, bilateral: Secondary | ICD-10-CM | POA: Diagnosis not present

## 2015-05-16 DIAGNOSIS — M6281 Muscle weakness (generalized): Secondary | ICD-10-CM | POA: Diagnosis not present

## 2015-05-16 DIAGNOSIS — I509 Heart failure, unspecified: Secondary | ICD-10-CM | POA: Diagnosis not present

## 2015-05-16 DIAGNOSIS — M1990 Unspecified osteoarthritis, unspecified site: Secondary | ICD-10-CM | POA: Diagnosis not present

## 2015-05-16 DIAGNOSIS — I10 Essential (primary) hypertension: Secondary | ICD-10-CM | POA: Diagnosis not present

## 2015-05-16 DIAGNOSIS — M542 Cervicalgia: Secondary | ICD-10-CM | POA: Diagnosis not present

## 2015-05-16 DIAGNOSIS — J449 Chronic obstructive pulmonary disease, unspecified: Secondary | ICD-10-CM | POA: Diagnosis not present

## 2015-05-18 DIAGNOSIS — M1990 Unspecified osteoarthritis, unspecified site: Secondary | ICD-10-CM | POA: Diagnosis not present

## 2015-05-18 DIAGNOSIS — I509 Heart failure, unspecified: Secondary | ICD-10-CM | POA: Diagnosis not present

## 2015-05-18 DIAGNOSIS — J449 Chronic obstructive pulmonary disease, unspecified: Secondary | ICD-10-CM | POA: Diagnosis not present

## 2015-05-18 DIAGNOSIS — M6281 Muscle weakness (generalized): Secondary | ICD-10-CM | POA: Diagnosis not present

## 2015-05-18 DIAGNOSIS — I10 Essential (primary) hypertension: Secondary | ICD-10-CM | POA: Diagnosis not present

## 2015-05-18 DIAGNOSIS — M542 Cervicalgia: Secondary | ICD-10-CM | POA: Diagnosis not present

## 2015-05-23 DIAGNOSIS — M542 Cervicalgia: Secondary | ICD-10-CM | POA: Diagnosis not present

## 2015-05-23 DIAGNOSIS — M6281 Muscle weakness (generalized): Secondary | ICD-10-CM | POA: Diagnosis not present

## 2015-05-23 DIAGNOSIS — M1990 Unspecified osteoarthritis, unspecified site: Secondary | ICD-10-CM | POA: Diagnosis not present

## 2015-05-23 DIAGNOSIS — I509 Heart failure, unspecified: Secondary | ICD-10-CM | POA: Diagnosis not present

## 2015-05-23 DIAGNOSIS — I10 Essential (primary) hypertension: Secondary | ICD-10-CM | POA: Diagnosis not present

## 2015-05-23 DIAGNOSIS — J449 Chronic obstructive pulmonary disease, unspecified: Secondary | ICD-10-CM | POA: Diagnosis not present

## 2015-05-25 DIAGNOSIS — I10 Essential (primary) hypertension: Secondary | ICD-10-CM | POA: Diagnosis not present

## 2015-05-25 DIAGNOSIS — I509 Heart failure, unspecified: Secondary | ICD-10-CM | POA: Diagnosis not present

## 2015-05-25 DIAGNOSIS — J449 Chronic obstructive pulmonary disease, unspecified: Secondary | ICD-10-CM | POA: Diagnosis not present

## 2015-05-25 DIAGNOSIS — M542 Cervicalgia: Secondary | ICD-10-CM | POA: Diagnosis not present

## 2015-05-25 DIAGNOSIS — M6281 Muscle weakness (generalized): Secondary | ICD-10-CM | POA: Diagnosis not present

## 2015-05-25 DIAGNOSIS — M1990 Unspecified osteoarthritis, unspecified site: Secondary | ICD-10-CM | POA: Diagnosis not present

## 2015-06-02 DIAGNOSIS — M542 Cervicalgia: Secondary | ICD-10-CM | POA: Diagnosis not present

## 2015-06-02 DIAGNOSIS — I509 Heart failure, unspecified: Secondary | ICD-10-CM | POA: Diagnosis not present

## 2015-06-02 DIAGNOSIS — M1990 Unspecified osteoarthritis, unspecified site: Secondary | ICD-10-CM | POA: Diagnosis not present

## 2015-06-02 DIAGNOSIS — J449 Chronic obstructive pulmonary disease, unspecified: Secondary | ICD-10-CM | POA: Diagnosis not present

## 2015-06-02 DIAGNOSIS — I10 Essential (primary) hypertension: Secondary | ICD-10-CM | POA: Diagnosis not present

## 2015-06-02 DIAGNOSIS — M6281 Muscle weakness (generalized): Secondary | ICD-10-CM | POA: Diagnosis not present

## 2015-06-06 DIAGNOSIS — M542 Cervicalgia: Secondary | ICD-10-CM | POA: Diagnosis not present

## 2015-06-06 DIAGNOSIS — I10 Essential (primary) hypertension: Secondary | ICD-10-CM | POA: Diagnosis not present

## 2015-06-06 DIAGNOSIS — M6281 Muscle weakness (generalized): Secondary | ICD-10-CM | POA: Diagnosis not present

## 2015-06-06 DIAGNOSIS — I509 Heart failure, unspecified: Secondary | ICD-10-CM | POA: Diagnosis not present

## 2015-06-06 DIAGNOSIS — M1990 Unspecified osteoarthritis, unspecified site: Secondary | ICD-10-CM | POA: Diagnosis not present

## 2015-06-06 DIAGNOSIS — J449 Chronic obstructive pulmonary disease, unspecified: Secondary | ICD-10-CM | POA: Diagnosis not present

## 2015-06-14 DIAGNOSIS — J449 Chronic obstructive pulmonary disease, unspecified: Secondary | ICD-10-CM | POA: Diagnosis not present

## 2015-06-14 DIAGNOSIS — I10 Essential (primary) hypertension: Secondary | ICD-10-CM | POA: Diagnosis not present

## 2015-06-14 DIAGNOSIS — I509 Heart failure, unspecified: Secondary | ICD-10-CM | POA: Diagnosis not present

## 2015-06-14 DIAGNOSIS — M542 Cervicalgia: Secondary | ICD-10-CM | POA: Diagnosis not present

## 2015-06-14 DIAGNOSIS — M1990 Unspecified osteoarthritis, unspecified site: Secondary | ICD-10-CM | POA: Diagnosis not present

## 2015-06-14 DIAGNOSIS — M6281 Muscle weakness (generalized): Secondary | ICD-10-CM | POA: Diagnosis not present

## 2015-06-15 DIAGNOSIS — M6281 Muscle weakness (generalized): Secondary | ICD-10-CM | POA: Diagnosis not present

## 2015-06-15 DIAGNOSIS — M542 Cervicalgia: Secondary | ICD-10-CM | POA: Diagnosis not present

## 2015-06-15 DIAGNOSIS — J449 Chronic obstructive pulmonary disease, unspecified: Secondary | ICD-10-CM | POA: Diagnosis not present

## 2015-06-15 DIAGNOSIS — I509 Heart failure, unspecified: Secondary | ICD-10-CM | POA: Diagnosis not present

## 2015-06-15 DIAGNOSIS — M1990 Unspecified osteoarthritis, unspecified site: Secondary | ICD-10-CM | POA: Diagnosis not present

## 2015-06-15 DIAGNOSIS — I10 Essential (primary) hypertension: Secondary | ICD-10-CM | POA: Diagnosis not present

## 2015-06-20 ENCOUNTER — Ambulatory Visit (INDEPENDENT_AMBULATORY_CARE_PROVIDER_SITE_OTHER): Payer: Medicare Other | Admitting: Internal Medicine

## 2015-06-20 ENCOUNTER — Encounter (INDEPENDENT_AMBULATORY_CARE_PROVIDER_SITE_OTHER): Payer: Self-pay | Admitting: Internal Medicine

## 2015-06-20 VITALS — BP 108/76 | HR 72 | Temp 97.9°F | Resp 18 | Ht 69.7 in | Wt 196.1 lb

## 2015-06-20 DIAGNOSIS — I639 Cerebral infarction, unspecified: Secondary | ICD-10-CM

## 2015-06-20 DIAGNOSIS — R1310 Dysphagia, unspecified: Secondary | ICD-10-CM

## 2015-06-20 DIAGNOSIS — R1319 Other dysphagia: Secondary | ICD-10-CM

## 2015-06-20 DIAGNOSIS — F458 Other somatoform disorders: Secondary | ICD-10-CM | POA: Diagnosis not present

## 2015-06-20 DIAGNOSIS — R1314 Dysphagia, pharyngoesophageal phase: Secondary | ICD-10-CM

## 2015-06-20 MED ORDER — LORAZEPAM 0.5 MG PO TABS: 0.5000 mg | ORAL_TABLET | Freq: Two times a day (BID) | ORAL | Status: DC | PRN

## 2015-06-20 NOTE — Patient Instructions (Signed)
Please call office with progress report in 2 weeks or if you experience side effects with lorazepam.

## 2015-06-20 NOTE — Progress Notes (Signed)
Presenting complaint;  Follow-up for dysphagia. Patient complains of itching involving head and neck.  Subjective:  Patient is a 81 year old Caucasian male who is here for scheduled visit accompanied by his wife and there are helper Nell. Patient underwent EGD with ED on 02/24/2015. He is sitting has improved great deal but not 100%. He has not had any episodes of food impaction or regurgitation and he denies heartburn. His appetite is fair. He has not lost any weight since his last visit. He states he is very impatient and complains of itching involving his head and neck. He states cold shower helps. He also has less itching on days when they're helper is at home with them. He believes his itching is secondary to nerves and wants to be treated.   Current Medications: Outpatient Encounter Prescriptions as of 06/20/2015  Medication Sig  . Acetaminophen (TYLENOL ARTHRITIS PAIN PO) Take 650 mg by mouth 4 (four) times daily.  Marland Kitchen allopurinol (ZYLOPRIM) 300 MG tablet Take 300 mg by mouth daily.  . clopidogrel (PLAVIX) 75 MG tablet Take 75 mg by mouth daily.   Marland Kitchen donepezil (ARICEPT) 10 MG tablet Take 1 tablet by mouth daily.  Marland Kitchen escitalopram (LEXAPRO) 10 MG tablet Take 10 mg by mouth at bedtime.   Marland Kitchen ipratropium (ATROVENT) 0.06 % nasal spray Place 2 sprays into both nostrils 2 (two) times daily.   Marland Kitchen Ketotifen Fumarate (THERA TEARS ALLERGY OP) Apply to eye. 3 drops in both eyes at least once a day per patient wife. Left eye.  . levothyroxine (SYNTHROID, LEVOTHROID) 125 MCG tablet Take 125 mcg by mouth daily.  Marland Kitchen LUMIGAN 0.01 % SOLN Place 1 drop into both eyes at bedtime.   . nitroGLYCERIN (NITROSTAT) 0.4 MG SL tablet Place 0.4 mg under the tongue every 5 (five) minutes as needed. Chest Pain  . Polyethyl Glycol-Propyl Glycol (SYSTANE) 0.4-0.3 % GEL Apply 1-2 drops to eye. Per wife sometimes he may use 3 drops in the right eye.  . polyethylene glycol (MIRALAX / GLYCOLAX) packet Take 17 g by mouth daily as  needed.   . saw palmetto 500 MG capsule Take 500 mg by mouth 2 (two) times daily.   . [DISCONTINUED] LOTEMAX 0.5 % OINT   . [DISCONTINUED] sotalol (BETAPACE) 80 MG tablet Take 40-80 mg by mouth 2 (two) times daily. Takes 40 mg in the morning and 80 mg in the evening.  . [DISCONTINUED] SOTALOL AF 80 MG TABS   . [DISCONTINUED] traMADol (ULTRAM) 50 MG tablet Take 50 mg by mouth every 6 (six) hours as needed.   No facility-administered encounter medications on file as of 06/20/2015.     Objective: Blood pressure 108/76, pulse 72, temperature 97.9 F (36.6 C), temperature source Oral, resp. rate 18, height 5' 9.7" (1.77 m), weight 196 lb 1.6 oz (88.95 kg). Patient is alert and in no acute distress. He is wearing his dark glasses. Conjunctiva is pink. Sclera is nonicteric Oropharyngeal mucosa is normal. No neck masses or thyromegaly noted. Cardiac exam with regular rhythm normal S1 and S2. No murmur or gallop noted. Lungs are clear to auscultation. No LE edema or clubbing noted.    Assessment:  #1. Dysphagia predominant secondary to esophageal motility disorder but he seemed to have responded to esophageal dilation performed on 02/24/2015. #2. Pruritus involving head and neck possibly secondary to nerves and anxiety or neurogenic in origin. There is no evidence of cholestatic liver disease.   Plan:  Lorazepam 0.5 mg by mouth twice a day when necessary  prescription given for 60 pills. Patient and his wife informed of potential side effects. Patient advised to chew foods orally and eats slowly and he should be sitting upright when he eats. Office visit in 3 months.

## 2015-06-26 DIAGNOSIS — H18413 Arcus senilis, bilateral: Secondary | ICD-10-CM | POA: Diagnosis not present

## 2015-06-26 DIAGNOSIS — H04123 Dry eye syndrome of bilateral lacrimal glands: Secondary | ICD-10-CM | POA: Diagnosis not present

## 2015-06-26 DIAGNOSIS — Z961 Presence of intraocular lens: Secondary | ICD-10-CM | POA: Diagnosis not present

## 2015-06-26 DIAGNOSIS — H409 Unspecified glaucoma: Secondary | ICD-10-CM | POA: Diagnosis not present

## 2015-06-28 ENCOUNTER — Telehealth (INDEPENDENT_AMBULATORY_CARE_PROVIDER_SITE_OTHER): Payer: Self-pay | Admitting: *Deleted

## 2015-06-28 ENCOUNTER — Other Ambulatory Visit (INDEPENDENT_AMBULATORY_CARE_PROVIDER_SITE_OTHER): Payer: Self-pay | Admitting: Internal Medicine

## 2015-06-28 DIAGNOSIS — F458 Other somatoform disorders: Secondary | ICD-10-CM

## 2015-06-28 MED ORDER — LORAZEPAM 1 MG PO TABS: 1.0000 mg | ORAL_TABLET | Freq: Two times a day (BID) | ORAL | Status: DC | PRN

## 2015-06-28 NOTE — Telephone Encounter (Signed)
Please call patient that he can take lorazepam 1 mg twice daily as needed. I will send new presscription to his pharmacy.

## 2015-06-28 NOTE — Telephone Encounter (Signed)
The ATIVAN is not working good. He took an extra dose and it worked good. Would like to Dr. Laural Golden to increase this medicine. His return phone number is [phone].

## 2015-06-28 NOTE — Telephone Encounter (Signed)
Patient advised of Dr. Laural Golden recommendations and voices understood.

## 2015-07-17 DIAGNOSIS — H524 Presbyopia: Secondary | ICD-10-CM | POA: Diagnosis not present

## 2015-07-17 DIAGNOSIS — H5053 Vertical heterophoria: Secondary | ICD-10-CM | POA: Diagnosis not present

## 2015-07-17 DIAGNOSIS — H4011X3 Primary open-angle glaucoma, severe stage: Secondary | ICD-10-CM | POA: Diagnosis not present

## 2015-07-18 ENCOUNTER — Encounter (INDEPENDENT_AMBULATORY_CARE_PROVIDER_SITE_OTHER): Payer: Self-pay | Admitting: Internal Medicine

## 2015-07-18 ENCOUNTER — Ambulatory Visit (INDEPENDENT_AMBULATORY_CARE_PROVIDER_SITE_OTHER): Payer: Medicare Other | Admitting: Internal Medicine

## 2015-07-18 VITALS — BP 102/72 | HR 64 | Temp 98.0°F | Resp 18 | Ht 69.7 in | Wt 200.4 lb

## 2015-07-18 DIAGNOSIS — F458 Other somatoform disorders: Secondary | ICD-10-CM | POA: Diagnosis not present

## 2015-07-18 DIAGNOSIS — I639 Cerebral infarction, unspecified: Secondary | ICD-10-CM

## 2015-07-18 DIAGNOSIS — H579 Unspecified disorder of eye and adnexa: Secondary | ICD-10-CM

## 2015-07-18 NOTE — Progress Notes (Signed)
Presenting complaint;  Patient is planning to undergo eye surgery and has questions.  Subjective:  Patient is 81 year old Caucasian male who requested to be seen today. He was last seen 4 weeks ago for follow-up of dysphagia/esophageal motility disorder and he was complaining of itching over his face and for him. He was begun on lorazepam and itching has resolved. He is been suffering from excruciating right eye pain which has affected her quality of his life. He is been advised enucleation and prosthesis placement. He wants to know if this surgery would have any effect on his GI tract and he also wants to know potential complications of the surgery and how effective it would be in any alleviating his pain. He has seen Drs. Hunt and  Stonecipher in addition to his optometrist. He has gained 4 pounds in the last 4 weeks. He is not having any side effects with lorazepam.   Current Medications: Outpatient Encounter Prescriptions as of 07/18/2015  Medication Sig  . Acetaminophen (TYLENOL ARTHRITIS PAIN PO) Take 650 mg by mouth 4 (four) times daily.  Marland Kitchen allopurinol (ZYLOPRIM) 300 MG tablet Take 300 mg by mouth daily.  . clopidogrel (PLAVIX) 75 MG tablet Take 75 mg by mouth daily.   Marland Kitchen donepezil (ARICEPT) 10 MG tablet Take 1 tablet by mouth daily.  Marland Kitchen escitalopram (LEXAPRO) 10 MG tablet Take 10 mg by mouth at bedtime.   Marland Kitchen ipratropium (ATROVENT) 0.06 % nasal spray Place 2 sprays into both nostrils 2 (two) times daily.   Marland Kitchen Ketotifen Fumarate (THERA TEARS ALLERGY OP) Apply to eye. 3 drops in both eyes at least once a day per patient wife. Left eye.  . levothyroxine (SYNTHROID, LEVOTHROID) 125 MCG tablet Take 125 mcg by mouth daily.  Marland Kitchen LORazepam (ATIVAN) 1 MG tablet Take 1 tablet (1 mg total) by mouth 2 (two) times daily as needed for anxiety.  . nitroGLYCERIN (NITROSTAT) 0.4 MG SL tablet Place 0.4 mg under the tongue every 5 (five) minutes as needed. Chest Pain  . Polyethyl Glycol-Propyl Glycol  (SYSTANE) 0.4-0.3 % GEL Apply 1-2 drops to eye. Per wife sometimes he may use 3 drops in the right eye.  . polyethylene glycol (MIRALAX / GLYCOLAX) packet Take 17 g by mouth daily as needed.   . saw palmetto 500 MG capsule Take 500 mg by mouth 2 (two) times daily.   . [DISCONTINUED] LUMIGAN 0.01 % SOLN Place 1 drop into both eyes at bedtime.    No facility-administered encounter medications on file as of 07/18/2015.     Objective: Blood pressure 102/72, pulse 64, temperature 98 F (36.7 C), temperature source Oral, resp. rate 18, height 5' 9.7" (1.77 m), weight 200 lb 6.4 oz (90.901 kg). Patient is alert and appears to be no acute distress. He is well dressed and this had an both eyes covered with dark glasses.    Assessment:  #1. Right eye problems. As to the specific questions regarding surgery and its benefits I would defer to his eye surgeon. I do not believe the surgery should have any effect on his swallowing difficulty but he will have to make sure he does not become constipated in postop period. #2. Neurogenic pruritus. He will continue aggressive pain which he is tolerating well. May be his pruritus will improve following eye surgery and he could come off lorazepam. Lorazepam is possibly also helping calm his nerves as he has become very irritable, angry and anxious. #3. Esophageal motility disorder. Esophagus was dilated in March 2016 and seemed  to have helped.  Plan:  Patient reassured. Patient advised to talk with Dr. Geoffry Paradise specific questions he has about eye surgery benefits and complications. Office visit in 6 months.

## 2015-07-18 NOTE — Patient Instructions (Signed)
Please feel free to call me feeling more questions.

## 2015-07-28 DIAGNOSIS — R233 Spontaneous ecchymoses: Secondary | ICD-10-CM | POA: Diagnosis not present

## 2015-07-28 DIAGNOSIS — J449 Chronic obstructive pulmonary disease, unspecified: Secondary | ICD-10-CM | POA: Diagnosis not present

## 2015-07-28 DIAGNOSIS — I2510 Atherosclerotic heart disease of native coronary artery without angina pectoris: Secondary | ICD-10-CM | POA: Diagnosis not present

## 2015-07-28 DIAGNOSIS — N401 Enlarged prostate with lower urinary tract symptoms: Secondary | ICD-10-CM | POA: Diagnosis not present

## 2015-08-11 ENCOUNTER — Encounter: Payer: Self-pay | Admitting: *Deleted

## 2015-08-29 DIAGNOSIS — M5442 Lumbago with sciatica, left side: Secondary | ICD-10-CM | POA: Diagnosis not present

## 2015-08-29 DIAGNOSIS — M9903 Segmental and somatic dysfunction of lumbar region: Secondary | ICD-10-CM | POA: Diagnosis not present

## 2015-08-29 DIAGNOSIS — M9902 Segmental and somatic dysfunction of thoracic region: Secondary | ICD-10-CM | POA: Diagnosis not present

## 2015-08-29 DIAGNOSIS — M9905 Segmental and somatic dysfunction of pelvic region: Secondary | ICD-10-CM | POA: Diagnosis not present

## 2015-09-05 ENCOUNTER — Other Ambulatory Visit (INDEPENDENT_AMBULATORY_CARE_PROVIDER_SITE_OTHER): Payer: Self-pay | Admitting: Internal Medicine

## 2015-09-07 ENCOUNTER — Encounter: Payer: Self-pay | Admitting: Internal Medicine

## 2015-09-07 ENCOUNTER — Ambulatory Visit (INDEPENDENT_AMBULATORY_CARE_PROVIDER_SITE_OTHER): Payer: Medicare Other | Admitting: *Deleted

## 2015-09-07 DIAGNOSIS — I480 Paroxysmal atrial fibrillation: Secondary | ICD-10-CM

## 2015-09-07 LAB — CUP PACEART INCLINIC DEVICE CHECK
Battery Remaining Longevity: 69.6 mo
Battery Voltage: 2.9 V
Date Time Interrogation Session: 20160922162543
Lead Channel Impedance Value: 412.5 Ohm
Lead Channel Pacing Threshold Amplitude: 0.625 V
Lead Channel Pacing Threshold Pulse Width: 0.5 ms
Lead Channel Pacing Threshold Pulse Width: 0.8 ms
Lead Channel Sensing Intrinsic Amplitude: 2.9 mV
Lead Channel Setting Pacing Amplitude: 0.875
Lead Channel Setting Pacing Amplitude: 2 V
Lead Channel Setting Pacing Pulse Width: 0.5 ms
Lead Channel Setting Sensing Sensitivity: 2 mV
MDC IDC MSMT LEADCHNL RA PACING THRESHOLD AMPLITUDE: 1 V
MDC IDC MSMT LEADCHNL RA PACING THRESHOLD AMPLITUDE: 1 V
MDC IDC MSMT LEADCHNL RA PACING THRESHOLD PULSEWIDTH: 0.8 ms
MDC IDC MSMT LEADCHNL RV IMPEDANCE VALUE: 575 Ohm
MDC IDC MSMT LEADCHNL RV SENSING INTR AMPL: 12 mV
MDC IDC PG SERIAL: 7183444
MDC IDC STAT BRADY RA PERCENT PACED: 70 %
MDC IDC STAT BRADY RV PERCENT PACED: 4.4 %

## 2015-09-07 NOTE — Progress Notes (Signed)
Pacemaker check in clinic. Normal device function. Thresholds, sensing, impedances consistent with previous measurements. Device programmed to maximize longevity. (229) mode switches (<1%)---max dur. 1 min 52 sec, Max A 404, Max V 128, last 9/21. (1) high ventricular rate noted---SVT. Device programmed at appropriate safety margins. Histogram distribution appropriate for patient activity level. Device programmed to optimize intrinsic conduction. Estimated longevity 5.5-5.8 years. Patient will follow up with GT/R in 6 months.

## 2015-09-08 DIAGNOSIS — H409 Unspecified glaucoma: Secondary | ICD-10-CM | POA: Diagnosis not present

## 2015-09-08 DIAGNOSIS — Z961 Presence of intraocular lens: Secondary | ICD-10-CM | POA: Diagnosis not present

## 2015-09-08 DIAGNOSIS — H04123 Dry eye syndrome of bilateral lacrimal glands: Secondary | ICD-10-CM | POA: Diagnosis not present

## 2015-09-08 DIAGNOSIS — H18413 Arcus senilis, bilateral: Secondary | ICD-10-CM | POA: Diagnosis not present

## 2015-09-19 ENCOUNTER — Ambulatory Visit (INDEPENDENT_AMBULATORY_CARE_PROVIDER_SITE_OTHER): Payer: Self-pay | Admitting: Internal Medicine

## 2015-09-20 DIAGNOSIS — M5442 Lumbago with sciatica, left side: Secondary | ICD-10-CM | POA: Diagnosis not present

## 2015-09-20 DIAGNOSIS — M9903 Segmental and somatic dysfunction of lumbar region: Secondary | ICD-10-CM | POA: Diagnosis not present

## 2015-09-20 DIAGNOSIS — M9905 Segmental and somatic dysfunction of pelvic region: Secondary | ICD-10-CM | POA: Diagnosis not present

## 2015-09-20 DIAGNOSIS — M9902 Segmental and somatic dysfunction of thoracic region: Secondary | ICD-10-CM | POA: Diagnosis not present

## 2015-10-04 ENCOUNTER — Other Ambulatory Visit (INDEPENDENT_AMBULATORY_CARE_PROVIDER_SITE_OTHER): Payer: Self-pay | Admitting: Internal Medicine

## 2015-10-23 ENCOUNTER — Encounter (HOSPITAL_COMMUNITY): Payer: Self-pay | Admitting: *Deleted

## 2015-10-23 ENCOUNTER — Emergency Department (HOSPITAL_COMMUNITY)
Admission: EM | Admit: 2015-10-23 | Discharge: 2015-10-23 | Disposition: A | Discharge status: Home / Self Care | Payer: Medicare Other | Source: Home / Self Care | Attending: Emergency Medicine | Admitting: Emergency Medicine

## 2015-10-23 DIAGNOSIS — F23 Brief psychotic disorder: Secondary | ICD-10-CM | POA: Diagnosis not present

## 2015-10-23 DIAGNOSIS — R451 Restlessness and agitation: Secondary | ICD-10-CM | POA: Diagnosis not present

## 2015-10-23 DIAGNOSIS — R58 Hemorrhage, not elsewhere classified: Secondary | ICD-10-CM | POA: Diagnosis not present

## 2015-10-23 DIAGNOSIS — J690 Pneumonitis due to inhalation of food and vomit: Secondary | ICD-10-CM | POA: Diagnosis not present

## 2015-10-23 DIAGNOSIS — I272 Other secondary pulmonary hypertension: Secondary | ICD-10-CM | POA: Diagnosis present

## 2015-10-23 DIAGNOSIS — F419 Anxiety disorder, unspecified: Secondary | ICD-10-CM | POA: Diagnosis present

## 2015-10-23 DIAGNOSIS — R0902 Hypoxemia: Secondary | ICD-10-CM | POA: Diagnosis not present

## 2015-10-23 DIAGNOSIS — I480 Paroxysmal atrial fibrillation: Secondary | ICD-10-CM | POA: Diagnosis present

## 2015-10-23 DIAGNOSIS — R0602 Shortness of breath: Secondary | ICD-10-CM | POA: Diagnosis not present

## 2015-10-23 DIAGNOSIS — R05 Cough: Secondary | ICD-10-CM | POA: Diagnosis not present

## 2015-10-23 DIAGNOSIS — Z950 Presence of cardiac pacemaker: Secondary | ICD-10-CM | POA: Diagnosis not present

## 2015-10-23 DIAGNOSIS — K068 Other specified disorders of gingiva and edentulous alveolar ridge: Secondary | ICD-10-CM | POA: Diagnosis present

## 2015-10-23 DIAGNOSIS — Z8249 Family history of ischemic heart disease and other diseases of the circulatory system: Secondary | ICD-10-CM | POA: Diagnosis not present

## 2015-10-23 DIAGNOSIS — K219 Gastro-esophageal reflux disease without esophagitis: Secondary | ICD-10-CM | POA: Diagnosis present

## 2015-10-23 DIAGNOSIS — J441 Chronic obstructive pulmonary disease with (acute) exacerbation: Secondary | ICD-10-CM | POA: Diagnosis present

## 2015-10-23 DIAGNOSIS — Z9981 Dependence on supplemental oxygen: Secondary | ICD-10-CM | POA: Diagnosis not present

## 2015-10-23 DIAGNOSIS — N400 Enlarged prostate without lower urinary tract symptoms: Secondary | ICD-10-CM | POA: Diagnosis present

## 2015-10-23 DIAGNOSIS — R040 Epistaxis: Secondary | ICD-10-CM | POA: Diagnosis not present

## 2015-10-23 DIAGNOSIS — Z87891 Personal history of nicotine dependence: Secondary | ICD-10-CM | POA: Diagnosis not present

## 2015-10-23 DIAGNOSIS — E039 Hypothyroidism, unspecified: Secondary | ICD-10-CM | POA: Diagnosis present

## 2015-10-23 DIAGNOSIS — F0390 Unspecified dementia without behavioral disturbance: Secondary | ICD-10-CM | POA: Diagnosis present

## 2015-10-23 DIAGNOSIS — J189 Pneumonia, unspecified organism: Secondary | ICD-10-CM | POA: Diagnosis not present

## 2015-10-23 DIAGNOSIS — Z7902 Long term (current) use of antithrombotics/antiplatelets: Secondary | ICD-10-CM | POA: Diagnosis not present

## 2015-10-23 DIAGNOSIS — K069 Disorder of gingiva and edentulous alveolar ridge, unspecified: Secondary | ICD-10-CM | POA: Diagnosis not present

## 2015-10-23 DIAGNOSIS — F329 Major depressive disorder, single episode, unspecified: Secondary | ICD-10-CM | POA: Diagnosis present

## 2015-10-23 DIAGNOSIS — J449 Chronic obstructive pulmonary disease, unspecified: Secondary | ICD-10-CM | POA: Diagnosis not present

## 2015-10-23 DIAGNOSIS — H409 Unspecified glaucoma: Secondary | ICD-10-CM | POA: Diagnosis present

## 2015-10-23 DIAGNOSIS — R1310 Dysphagia, unspecified: Secondary | ICD-10-CM | POA: Diagnosis not present

## 2015-10-23 LAB — CBC WITH DIFFERENTIAL/PLATELET
BASOS ABS: 0 10*3/uL (ref 0.0–0.1)
Basophils Relative: 0 %
EOS PCT: 5 %
Eosinophils Absolute: 0.4 10*3/uL (ref 0.0–0.7)
HCT: 39.1 % (ref 39.0–52.0)
Hemoglobin: 13.1 g/dL (ref 13.0–17.0)
Lymphocytes Relative: 18 %
Lymphs Abs: 1.5 10*3/uL (ref 0.7–4.0)
MCH: 30.9 pg (ref 26.0–34.0)
MCHC: 33.5 g/dL (ref 30.0–36.0)
MCV: 92.2 fL (ref 78.0–100.0)
MONOS PCT: 9 %
Monocytes Absolute: 0.7 10*3/uL (ref 0.1–1.0)
NEUTROS ABS: 5.3 10*3/uL (ref 1.7–7.7)
NEUTROS PCT: 68 %
PLATELETS: 280 10*3/uL (ref 150–400)
RBC: 4.24 MIL/uL (ref 4.22–5.81)
RDW: 13.6 % (ref 11.5–15.5)
WBC: 7.9 10*3/uL (ref 4.0–10.5)

## 2015-10-23 MED ORDER — LIDOCAINE-EPINEPHRINE (PF) 1 %-1:200000 IJ SOLN
10.0000 mL | Freq: Once | INTRAMUSCULAR | Status: DC
  Filled 2015-10-23: qty 10

## 2015-10-23 NOTE — ED Notes (Signed)
Tea bag and gauze placed in pt's mouth to control bleeding,.

## 2015-10-23 NOTE — ED Provider Notes (Signed)
CSN: [id number]     Arrival date & time 10/23/15  1750 History   First MD Initiated Contact with Patient 10/23/15 1758     Chief Complaint  Patient presents with  . oral bleeding      (Consider location/radiation/quality/duration/timing/severity/associated sxs/prior Treatment) HPI Comments: The patient is a 81 year old male, he has a history of having a dental procedure this afternoon approximately 4 hours ago when his right upper tooth was removed. The patient does take Plavix, he has had bleeding since just after the procedure, this has been constant, he states that he is having lots of bleeding, he takes a blood thinner for his atrial fibrillation and pacemaker. He denies any other symptoms. The bleeding is persistent, moderate, nothing seems to make it stop, he has been keeping compression on it with a piece of gauze  The history is provided by the patient.    Past Medical History  Diagnosis Date  . GERD (gastroesophageal reflux disease)   . COPD (chronic obstructive pulmonary disease) ([place])   . Glaucoma   . Hypothyroid   . Pacemaker 09/20/2010     ST Jude Accent DR RF device  MODEL #PN2210,SERIAL #[id number]  . Glaucoma   . Pulmonary HTN ([facility])     DUE TO CHRONIC HYPOXIA   FROM COPD  . CHF (congestive heart failure) ([facility])     right heart failure and pulm htn due to chronic hypoxia from COPD  . Atrial fib/flutter, transient     not on warfarin due to risk of falls ,on aspirin for prophylaxis  . Sinus node dysfunction ([place])     Dual-chamber permanent pacermaker ST JUDE  . Gout    Past Surgical History  Procedure Laterality Date  . Doppler echocardiography  07/25/2011    EF =50-55%  . Nm myocar perf wall motion  07/25/2011  . Insert / replace / remove pacemaker  09/20/2010    ST JUDE ACCENT-dual chamber ;    . Lung biopsy    . Colonoscopy w/ polypectomy    . Esophagogastroduodenoscopy N/A 02/24/2015    Procedure: ESOPHAGOGASTRODUODENOSCOPY (EGD);  Surgeon: [name],  MD;  Location: [facility];  Service: Endoscopy;  Laterality: N/A;  10:00  . Esophageal dilation N/A 02/24/2015    Procedure: ESOPHAGEAL DILATION;  Surgeon: [name], MD;  Location: [facility];  Service: Endoscopy;  Laterality: N/A;   No family history on file. Social History  Substance Use Topics  . Smoking status: Former Smoker -- 3.00 packs/day for 18 years    Types: Cigarettes    Quit date: 12/16/1953  . Smokeless tobacco: Never Used  . Alcohol Use: No    Review of Systems  HENT: Positive for dental problem.   Hematological: Bruises/bleeds easily.      Allergies  Morphine and related; Multaq; Penicillins; Latex; Sulfonamide derivatives; and Testosterone  Home Medications   Prior to Admission medications   Medication Sig Start Date End Date Taking? Authorizing Provider  Acetaminophen (TYLENOL ARTHRITIS PAIN PO) Take 650 mg by mouth 4 (four) times daily.    Historical Provider, MD  allopurinol (ZYLOPRIM) 300 MG tablet Take 300 mg by mouth daily.    Historical Provider, MD  clopidogrel (PLAVIX) 75 MG tablet Take 75 mg by mouth daily.  02/27/15   Historical Provider, MD  COMBIGAN 0.2-0.5 % ophthalmic solution  10/17/15   Historical Provider, MD  donepezil (ARICEPT) 10 MG tablet Take 1 tablet by mouth daily. 03/02/14   Historical Provider, MD  escitalopram (  LEXAPRO) 10 MG tablet Take 10 mg by mouth at bedtime.  01/25/15   Historical Provider, MD  ipratropium (ATROVENT) 0.06 % nasal spray Place 2 sprays into both nostrils 2 (two) times daily.     Historical Provider, MD  Ketotifen Fumarate (THERA TEARS ALLERGY OP) Apply to eye. 3 drops in both eyes at least once a day per patient wife. Left eye.    Historical Provider, MD  levothyroxine (SYNTHROID, LEVOTHROID) 125 MCG tablet Take 125 mcg by mouth daily.    Historical Provider, MD  LORazepam (ATIVAN) 1 MG tablet TAKE ONE TABLET BY MOUTH TWICE DAILY AS NEEDED. 10/04/15   [name], MD  nitroGLYCERIN (NITROSTAT) 0.4 MG  SL tablet Place 0.4 mg under the tongue every 5 (five) minutes as needed. Chest Pain    Historical Provider, MD  pilocarpine (PILOCAR) 1 % ophthalmic solution  09/26/15   Historical Provider, MD  Polyethyl Glycol-Propyl Glycol (SYSTANE) 0.4-0.3 % GEL Apply 1-2 drops to eye. Per wife sometimes he may use 3 drops in the right eye.    Historical Provider, MD  polyethylene glycol (MIRALAX / GLYCOLAX) packet Take 17 g by mouth daily as needed.     Historical Provider, MD  saw palmetto 500 MG capsule Take 500 mg by mouth 2 (two) times daily.     Historical Provider, MD  traZODone (DESYREL) 50 MG tablet  10/16/15   Historical Provider, MD   BP 154/86 mmHg  Pulse 70  Resp 16  SpO2 100% Physical Exam  Constitutional: He appears well-developed and well-nourished.  HENT:  Head: Normocephalic and atraumatic.  Heavy oozing from the bed of the dental extraction in the right upper jaw  Eyes: Conjunctivae are normal. Right eye exhibits no discharge. Left eye exhibits no discharge.  Pulmonary/Chest: Effort normal. No respiratory distress.  Neurological: He is alert. Coordination normal.  Skin: Skin is warm and dry. No rash noted. He is not diaphoretic. No erythema.  Psychiatric: He has a normal mood and affect.  Nursing note and vitals reviewed.   ED Course  Procedures (including critical care time) Labs Review Labs Reviewed  CBC WITH DIFFERENTIAL/PLATELET    Imaging Review No results found. I have personally reviewed and evaluated these images and lab results as part of my medical decision-making.    MDM   Final diagnoses:  Bleeding gums    The patient responded favorably to lidocaine with epinephrine injections into the bed of the dental extraction as well as into the gumline. The patient had a gauze pack as well as a tea bag placed against the bleeding, there was successful cessation of bleeding, vital signs remain stable, on recheck there is no bleeding, the patient appears  hemodynamically stable and is appearing hemodynamically stable for discharge.      Noemi Chapel, MD 10/23/15 [phone]

## 2015-10-23 NOTE — ED Notes (Signed)
Pt got tooth extracted this afternoon and was not taken off blood thinners. Pt has had oral bleeding since. Pt states he is swallowing large amounts of blood. Pt is alert at this time.

## 2015-10-23 NOTE — ED Notes (Signed)
Patient and family verbalizes understanding of discharge instructions, home care and follow up care if needed. Patient out of department at this time with family.

## 2015-10-23 NOTE — ED Notes (Signed)
Placed pt on 2L nasal cannula for comfort. Pt wears oxygen when he sleeps.

## 2015-10-24 ENCOUNTER — Encounter (HOSPITAL_COMMUNITY): Payer: Self-pay | Admitting: Emergency Medicine

## 2015-10-24 ENCOUNTER — Inpatient Hospital Stay (HOSPITAL_COMMUNITY)
Admission: EM | Admit: 2015-10-24 | Discharge: 2015-10-24 | DRG: 178 | Discharge status: Against Medical Advice | Payer: Medicare Other | Attending: Pulmonary Disease | Admitting: Pulmonary Disease

## 2015-10-24 ENCOUNTER — Other Ambulatory Visit (HOSPITAL_COMMUNITY): Payer: Self-pay

## 2015-10-24 ENCOUNTER — Emergency Department (HOSPITAL_COMMUNITY): Payer: Medicare Other

## 2015-10-24 ENCOUNTER — Inpatient Hospital Stay (HOSPITAL_COMMUNITY): Payer: Medicare Other

## 2015-10-24 ENCOUNTER — Ambulatory Visit (HOSPITAL_COMMUNITY): Payer: Self-pay

## 2015-10-24 DIAGNOSIS — K219 Gastro-esophageal reflux disease without esophagitis: Secondary | ICD-10-CM | POA: Diagnosis present

## 2015-10-24 DIAGNOSIS — J441 Chronic obstructive pulmonary disease with (acute) exacerbation: Secondary | ICD-10-CM | POA: Diagnosis present

## 2015-10-24 DIAGNOSIS — J690 Pneumonitis due to inhalation of food and vomit: Secondary | ICD-10-CM | POA: Diagnosis present

## 2015-10-24 DIAGNOSIS — F23 Brief psychotic disorder: Secondary | ICD-10-CM | POA: Diagnosis not present

## 2015-10-24 DIAGNOSIS — F419 Anxiety disorder, unspecified: Secondary | ICD-10-CM | POA: Diagnosis present

## 2015-10-24 DIAGNOSIS — H409 Unspecified glaucoma: Secondary | ICD-10-CM | POA: Diagnosis present

## 2015-10-24 DIAGNOSIS — Z87891 Personal history of nicotine dependence: Secondary | ICD-10-CM

## 2015-10-24 DIAGNOSIS — F329 Major depressive disorder, single episode, unspecified: Secondary | ICD-10-CM | POA: Diagnosis present

## 2015-10-24 DIAGNOSIS — Z7902 Long term (current) use of antithrombotics/antiplatelets: Secondary | ICD-10-CM

## 2015-10-24 DIAGNOSIS — I272 Other secondary pulmonary hypertension: Secondary | ICD-10-CM | POA: Diagnosis present

## 2015-10-24 DIAGNOSIS — I480 Paroxysmal atrial fibrillation: Secondary | ICD-10-CM | POA: Diagnosis present

## 2015-10-24 DIAGNOSIS — R58 Hemorrhage, not elsewhere classified: Secondary | ICD-10-CM | POA: Diagnosis not present

## 2015-10-24 DIAGNOSIS — K068 Other specified disorders of gingiva and edentulous alveolar ridge: Secondary | ICD-10-CM | POA: Diagnosis present

## 2015-10-24 DIAGNOSIS — Z950 Presence of cardiac pacemaker: Secondary | ICD-10-CM | POA: Diagnosis not present

## 2015-10-24 DIAGNOSIS — N400 Enlarged prostate without lower urinary tract symptoms: Secondary | ICD-10-CM | POA: Diagnosis present

## 2015-10-24 DIAGNOSIS — R1310 Dysphagia, unspecified: Secondary | ICD-10-CM | POA: Diagnosis not present

## 2015-10-24 DIAGNOSIS — E039 Hypothyroidism, unspecified: Secondary | ICD-10-CM | POA: Diagnosis present

## 2015-10-24 DIAGNOSIS — F0390 Unspecified dementia without behavioral disturbance: Secondary | ICD-10-CM | POA: Diagnosis present

## 2015-10-24 DIAGNOSIS — Z8249 Family history of ischemic heart disease and other diseases of the circulatory system: Secondary | ICD-10-CM

## 2015-10-24 DIAGNOSIS — Z9981 Dependence on supplemental oxygen: Secondary | ICD-10-CM | POA: Diagnosis not present

## 2015-10-24 DIAGNOSIS — R451 Restlessness and agitation: Secondary | ICD-10-CM | POA: Diagnosis not present

## 2015-10-24 DIAGNOSIS — J189 Pneumonia, unspecified organism: Secondary | ICD-10-CM | POA: Diagnosis present

## 2015-10-24 DIAGNOSIS — J449 Chronic obstructive pulmonary disease, unspecified: Secondary | ICD-10-CM | POA: Diagnosis not present

## 2015-10-24 DIAGNOSIS — R05 Cough: Secondary | ICD-10-CM | POA: Diagnosis not present

## 2015-10-24 DIAGNOSIS — R0602 Shortness of breath: Secondary | ICD-10-CM | POA: Diagnosis present

## 2015-10-24 LAB — BASIC METABOLIC PANEL
Anion gap: 7 (ref 5–15)
Anion gap: 6 (ref 5–15)
BUN: 24 mg/dL — ABNORMAL HIGH (ref 6–20)
BUN: 29 mg/dL — AB (ref 6–20)
CHLORIDE: 102 mmol/L (ref 101–111)
CO2: 31 mmol/L (ref 22–32)
CO2: 31 mmol/L (ref 22–32)
CREATININE: 0.91 mg/dL (ref 0.61–1.24)
Calcium: 9.2 mg/dL (ref 8.9–10.3)
Calcium: 8.8 mg/dL — ABNORMAL LOW (ref 8.9–10.3)
Chloride: 101 mmol/L (ref 101–111)
Creatinine, Ser: 0.88 mg/dL (ref 0.61–1.24)
GFR calc Af Amer: >60 mL/min (ref >60)
GFR calc Af Amer: >60 mL/min (ref >60)
GFR calc non Af Amer: >60 mL/min (ref >60)
GFR calc non Af Amer: >60 mL/min (ref >60)
GLUCOSE: 107 mg/dL — AB (ref 65–99)
Glucose, Bld: 101 mg/dL — ABNORMAL HIGH (ref 65–99)
Potassium: 4.1 mmol/L (ref 3.5–5.1)
Potassium: 4.3 mmol/L (ref 3.5–5.1)
SODIUM: 139 mmol/L (ref 135–145)
Sodium: 139 mmol/L (ref 135–145)

## 2015-10-24 LAB — CBC WITH DIFFERENTIAL/PLATELET
BASOS PCT: 0 %
Basophils Absolute: 0 10*3/uL (ref 0.0–0.1)
Eosinophils Absolute: 0.3 10*3/uL (ref 0.0–0.7)
Eosinophils Relative: 4 %
HCT: 37.2 % — ABNORMAL LOW (ref 39.0–52.0)
Hemoglobin: 12.3 g/dL — ABNORMAL LOW (ref 13.0–17.0)
Lymphocytes Relative: 15 %
Lymphs Abs: 1.2 10*3/uL (ref 0.7–4.0)
MCH: 30.5 pg (ref 26.0–34.0)
MCHC: 33.1 g/dL (ref 30.0–36.0)
MCV: 92.3 fL (ref 78.0–100.0)
MONO ABS: 1.2 10*3/uL — AB (ref 0.1–1.0)
Monocytes Relative: 15 %
NEUTROS ABS: 5.1 10*3/uL (ref 1.7–7.7)
NEUTROS PCT: 66 %
PLATELETS: 275 10*3/uL (ref 150–400)
RBC: 4.03 MIL/uL — AB (ref 4.22–5.81)
RDW: 13.8 % (ref 11.5–15.5)
WBC: 7.8 10*3/uL (ref 4.0–10.5)

## 2015-10-24 LAB — CBC
HEMATOCRIT: 34.8 % — AB (ref 39.0–52.0)
HEMOGLOBIN: 11.4 g/dL — AB (ref 13.0–17.0)
MCH: 30.3 pg (ref 26.0–34.0)
MCHC: 32.8 g/dL (ref 30.0–36.0)
MCV: 92.6 fL (ref 78.0–100.0)
Platelets: 255 10*3/uL (ref 150–400)
RBC: 3.76 MIL/uL — ABNORMAL LOW (ref 4.22–5.81)
RDW: 13.8 % (ref 11.5–15.5)
WBC: 8.4 10*3/uL (ref 4.0–10.5)

## 2015-10-24 LAB — SAMPLE TO BLOOD BANK

## 2015-10-24 LAB — PROTIME-INR
INR: 1.13 (ref 0.00–1.49)
Prothrombin Time: 14.7 seconds (ref 11.6–15.2)

## 2015-10-24 LAB — APTT: aPTT: 27 seconds (ref 24–37)

## 2015-10-24 MED ORDER — ALLOPURINOL 300 MG PO TABS
300.0000 mg | ORAL_TABLET | Freq: Every day | ORAL | Status: DC
  Filled 2015-10-24: qty 1

## 2015-10-24 MED ORDER — DONEPEZIL HCL 5 MG PO TABS
10.0000 mg | ORAL_TABLET | Freq: Every day | ORAL | Status: DC
  Filled 2015-10-24: qty 2

## 2015-10-24 MED ORDER — POLYVINYL ALCOHOL 1.4 % OP SOLN
1.0000 [drp] | Freq: Three times a day (TID) | OPHTHALMIC | Status: DC
Start: 2015-10-24 — End: 2015-10-24
  Filled 2015-10-24: qty 15

## 2015-10-24 MED ORDER — FENTANYL CITRATE (PF) 100 MCG/2ML IJ SOLN
50.0000 ug | Freq: Once | INTRAMUSCULAR | Status: AC
  Administered 2015-10-24: 50 ug via INTRAVENOUS
  Filled 2015-10-24: qty 2

## 2015-10-24 MED ORDER — GUAIFENESIN ER 600 MG PO TB12
1200.0000 mg | ORAL_TABLET | Freq: Two times a day (BID) | ORAL | Status: DC
  Filled 2015-10-24: qty 2

## 2015-10-24 MED ORDER — LORAZEPAM 1 MG PO TABS: 1.0000 mg | ORAL_TABLET | Freq: Once | ORAL | Status: DC

## 2015-10-24 MED ORDER — METHYLPREDNISOLONE SODIUM SUCC 40 MG IJ SOLR
40.0000 mg | Freq: Two times a day (BID) | INTRAMUSCULAR | Status: DC
  Filled 2015-10-24: qty 1

## 2015-10-24 MED ORDER — SODIUM CHLORIDE 0.9 % IJ SOLN: 3.0000 mL | Freq: Two times a day (BID) | INTRAMUSCULAR | Status: DC

## 2015-10-24 MED ORDER — NITROGLYCERIN 0.4 MG SL SUBL: 0.4000 mg | SUBLINGUAL_TABLET | SUBLINGUAL | Status: DC | PRN

## 2015-10-24 MED ORDER — ALBUTEROL SULFATE (2.5 MG/3ML) 0.083% IN NEBU: 2.5000 mg | INHALATION_SOLUTION | RESPIRATORY_TRACT | Status: DC | PRN

## 2015-10-24 MED ORDER — DEXTROSE 5 % IV SOLN
1.0000 g | Freq: Once | INTRAVENOUS | Status: AC
  Administered 2015-10-24: 1 g via INTRAVENOUS
  Filled 2015-10-24: qty 10

## 2015-10-24 MED ORDER — TRAZODONE HCL 50 MG PO TABS: 50.0000 mg | ORAL_TABLET | Freq: Every day | ORAL | Status: DC

## 2015-10-24 MED ORDER — SODIUM CHLORIDE 0.9 % IV SOLN
INTRAVENOUS | Status: DC
  Administered 2015-10-24: 06:00:00 via INTRAVENOUS

## 2015-10-24 MED ORDER — PIPERACILLIN-TAZOBACTAM 3.375 G IVPB
3.3750 g | Freq: Once | INTRAVENOUS | Status: AC
  Administered 2015-10-24: 3.375 g via INTRAVENOUS

## 2015-10-24 MED ORDER — DEXTROSE 5 % IV SOLN
500.0000 mg | Freq: Once | INTRAVENOUS | Status: AC
  Administered 2015-10-24: 500 mg via INTRAVENOUS
  Filled 2015-10-24: qty 500

## 2015-10-24 MED ORDER — POLYETHYLENE GLYCOL 3350 17 G PO PACK: 17.0000 g | PACK | Freq: Every day | ORAL | Status: DC | PRN

## 2015-10-24 MED ORDER — POLYETHYL GLYCOL-PROPYL GLYCOL 0.4-0.3 % OP GEL
Freq: Three times a day (TID) | OPHTHALMIC | Status: DC
  Filled 2015-10-24: qty 10

## 2015-10-24 MED ORDER — PIPERACILLIN-TAZOBACTAM 3.375 G IVPB
INTRAVENOUS | Status: AC
  Filled 2015-10-24: qty 50

## 2015-10-24 MED ORDER — PIPERACILLIN-TAZOBACTAM 3.375 G IVPB
3.3750 g | Freq: Three times a day (TID) | INTRAVENOUS | Status: DC
Start: 2015-10-24 — End: 2015-10-24
  Filled 2015-10-24 (×4): qty 50

## 2015-10-24 MED ORDER — ACETAMINOPHEN 325 MG PO TABS: 650.0000 mg | ORAL_TABLET | Freq: Four times a day (QID) | ORAL | Status: DC | PRN

## 2015-10-24 MED ORDER — ONDANSETRON HCL 4 MG/2ML IJ SOLN
4.0000 mg | Freq: Four times a day (QID) | INTRAMUSCULAR | Status: DC | PRN
  Administered 2015-10-24: 4 mg via INTRAVENOUS
  Filled 2015-10-24 (×2): qty 2

## 2015-10-24 MED ORDER — LIDOCAINE-EPINEPHRINE (PF) 1 %-1:200000 IJ SOLN
20.0000 mL | Freq: Once | INTRAMUSCULAR | Status: AC
  Administered 2015-10-24: 20 mL via INTRADERMAL
  Filled 2015-10-24: qty 20

## 2015-10-24 MED ORDER — TRANEXAMIC ACID 1000 MG/10ML IV SOLN
500.0000 mg | Freq: Once | INTRAVENOUS | Status: AC
  Administered 2015-10-24: 500 mg via TOPICAL
  Filled 2015-10-24: qty 10

## 2015-10-24 MED ORDER — ACETAMINOPHEN 650 MG RE SUPP: 650.0000 mg | Freq: Four times a day (QID) | RECTAL | Status: DC | PRN

## 2015-10-24 MED ORDER — ESCITALOPRAM OXALATE 10 MG PO TABS: 10.0000 mg | ORAL_TABLET | Freq: Every day | ORAL | Status: DC

## 2015-10-24 MED ORDER — LORAZEPAM 0.5 MG PO TABS: 0.5000 mg | ORAL_TABLET | Freq: Two times a day (BID) | ORAL | Status: DC | PRN

## 2015-10-24 MED ORDER — LEVOTHYROXINE SODIUM 25 MCG PO TABS
125.0000 ug | ORAL_TABLET | Freq: Every day | ORAL | Status: DC
  Administered 2015-10-24: 125 ug via ORAL
  Filled 2015-10-24 (×4): qty 1

## 2015-10-24 MED ORDER — KETOTIFEN FUMARATE 0.025 % OP SOLN
1.0000 [drp] | Freq: Two times a day (BID) | OPHTHALMIC | Status: DC
  Filled 2015-10-24: qty 5

## 2015-10-24 NOTE — ED Provider Notes (Signed)
TIME SEEN: 1:10 AM   CHIEF COMPLAINT: bleeding gums  HPI: Pt is a 81 y.o. M with h/o COPD, pulm HTN, CHF, a fib, sinus node dysfunction status post pacemaker who is on Plavix who presents emergency department with bleeding from his upper right gum since 2 PM yesterday. Patient was seen by Gery Pray DDS yesterday afternoon and had a tooth extracted because the tooth was fractured. States after the procedure he began bleeding in the dentist had to come out to his car to place more packing in his mouth. Was seen in the emergency department earlier today for bleeding that was well-controlled with lidocaine with epinephrine injections. States bleeding never completely stopped but became brisk again and he has had a Tea bag in his mouth with compression for the past 4 hours. States he feels like he cannot breathe because he is coughing so much. States he has been taking his Plavix regularly. Not on any other anticoagulation.  ROS: See HPI Constitutional: no fever  Eyes: no drainage  ENT: no runny nose   Cardiovascular:  no chest pain  Resp: no SOB  GI: no vomiting GU: no dysuria Integumentary: no rash  Allergy: no hives  Musculoskeletal: no leg swelling  Neurological: no slurred speech ROS otherwise negative  PAST MEDICAL HISTORY/PAST SURGICAL HISTORY:  Past Medical History  Diagnosis Date  . GERD (gastroesophageal reflux disease)   . COPD (chronic obstructive pulmonary disease) ([place])   . Glaucoma   . Hypothyroid   . Pacemaker 09/20/2010     ST Jude Accent DR RF device  MODEL #PN2210,SERIAL #[id number]  . Glaucoma   . Pulmonary HTN ([facility])     DUE TO CHRONIC HYPOXIA   FROM COPD  . CHF (congestive heart failure) ([facility])     right heart failure and pulm htn due to chronic hypoxia from COPD  . Atrial fib/flutter, transient     not on warfarin due to risk of falls ,on aspirin for prophylaxis  . Sinus node dysfunction ([place])     Dual-chamber permanent pacermaker ST JUDE  . Gout      MEDICATIONS:  Prior to Admission medications   Medication Sig Start Date End Date Taking? Authorizing Provider  Acetaminophen (TYLENOL ARTHRITIS PAIN PO) Take 650 mg by mouth 4 (four) times daily.    Historical Provider, MD  allopurinol (ZYLOPRIM) 300 MG tablet Take 300 mg by mouth daily.    Historical Provider, MD  clopidogrel (PLAVIX) 75 MG tablet Take 75 mg by mouth daily.  02/27/15   Historical Provider, MD  COMBIGAN 0.2-0.5 % ophthalmic solution  10/17/15   Historical Provider, MD  donepezil (ARICEPT) 10 MG tablet Take 1 tablet by mouth daily. 03/02/14   Historical Provider, MD  escitalopram (LEXAPRO) 10 MG tablet Take 10 mg by mouth at bedtime.  01/25/15   Historical Provider, MD  ipratropium (ATROVENT) 0.06 % nasal spray Place 2 sprays into both nostrils 2 (two) times daily.     Historical Provider, MD  Ketotifen Fumarate (THERA TEARS ALLERGY OP) Apply to eye. 3 drops in both eyes at least once a day per patient wife. Left eye.    Historical Provider, MD  levothyroxine (SYNTHROID, LEVOTHROID) 125 MCG tablet Take 125 mcg by mouth daily.    Historical Provider, MD  LORazepam (ATIVAN) 1 MG tablet TAKE ONE TABLET BY MOUTH TWICE DAILY AS NEEDED. 10/04/15   [name], MD  nitroGLYCERIN (NITROSTAT) 0.4 MG SL tablet Place 0.4 mg under the tongue every 5 (five) minutes  as needed. Chest Pain    Historical Provider, MD  pilocarpine (PILOCAR) 1 % ophthalmic solution  09/26/15   Historical Provider, MD  Polyethyl Glycol-Propyl Glycol (SYSTANE) 0.4-0.3 % GEL Apply 1-2 drops to eye. Per wife sometimes he may use 3 drops in the right eye.    Historical Provider, MD  polyethylene glycol (MIRALAX / GLYCOLAX) packet Take 17 g by mouth daily as needed.     Historical Provider, MD  saw palmetto 500 MG capsule Take 500 mg by mouth 2 (two) times daily.     Historical Provider, MD  traZODone (DESYREL) 50 MG tablet  10/16/15   Historical Provider, MD    ALLERGIES:  Allergies  Allergen Reactions  .  Morphine And Related   . Multaq [Dronedarone]   . Penicillins Other (See Comments)    Unknown  . Latex Rash  . Sulfonamide Derivatives Rash  . Testosterone Rash    The cream form causes a rash when rubbed on skin.     SOCIAL HISTORY:  Social History  Substance Use Topics  . Smoking status: Former Smoker -- 3.00 packs/day for 18 years    Types: Cigarettes    Quit date: 12/16/1953  . Smokeless tobacco: Never Used  . Alcohol Use: No    FAMILY HISTORY: History reviewed. No pertinent family history.  EXAM: BP 146/82 mmHg  Pulse 70  Resp 18  Ht _0  (1.778 m)  Wt 200 lb (90.719 kg)  BMI 28.70 kg/m2  SpO2 94% CONSTITUTIONAL: Alert and oriented and responds appropriately to questions. Elderly, appears uncomfortable, agitated HEAD: Normocephalic EYES: Conjunctivae clear, PERRL ENT: normal nose; no rhinorrhea; moist mucous membranes; pharynx without lesions noted; multiple dental caries and poor dentition, area of bleeding and large associated clot to the right upper gumline; no Ludwigs, no stridor and normal phonation NECK: Supple, no meningismus, no LAD  CARD: RRR; S1 and S2 appreciated; no murmurs, no clicks, no rubs, no gallops RESP: Normal chest excursion without splinting or tachypnea; breath sounds clear and equal bilaterally; no wheezes, no rhonchi, no rales, no hypoxia or respiratory distress, speaking full sentences ABD/GI: Normal bowel sounds; non-distended; soft, non-tender, no rebound, no guarding, no peritoneal signs BACK:  The back appears normal and is non-tender to palpation, there is no CVA tenderness EXT: Normal ROM in all joints; non-tender to palpation; no edema; normal capillary refill; no cyanosis, no calf tenderness or swelling    SKIN: Normal color for age and race; warm NEURO: Moves all extremities equally, sensation to light touch intact diffusely, cranial nerves II through XII intact PSYCH: The patient's mood and manner are appropriate. Grooming and  personal hygiene are appropriate.  MEDICAL DECISION MAKING: Patient here with bleeding from his gums. Was seen earlier today after dental extraction at 2 PM with bleeding. Bleeding controlled using lidocaine with epinephrine injections. Family reports despite compression and using tea bags at home and he has had continued bleeding and is now having large clots. Patient reports he feels achy cannot breathe because he is swallowing the blood in his coughing. He does were oxygen intermittently. He has a thick wet cough on exam and has some mild respiratory distress with minimal activity. No hypoxia. Will obtain labs, chest x-ray. I have attempted to control bleeding with lidocaine with epinephrine without relief. During this process patient lost approximately 150 mL of blood. He does not seem hemodynamically unstable but there is concern for significant blood loss if he has been bleeding this heavily for the past 4  hours. Will have patient placed in the T bags in his mouth and hold compression and reassess.  ED PROGRESS: No improvement with compression. Discussed with pharmacist Santiago Glad at [facility].  She recommends transexamic acid topically to patient's tooth. She recommends soaking the gauze in this medication and allowing patient to hold it in his mouth. We've attempted to contact his dentist on call but they do not have anyone on call for this tenderness. We have attempted to contact our dentist on call Dr. Geralynn Ochs several times.   Patient's labs show stable hemoglobin. After 30 minutes patient has had complete cessation of his bleeding. His chest x-ray does show left-sided pneumonia. Suspect this is from aspiration. Given Rocephin and Zithromax. Blood cultures pending. Discussed with hospitalist Dr. Waldron Labs who will see patient in the emergency department for admission.   I personally performed the services described in this documentation, which was scribed in my presence. The recorded information has  been reviewed and is accurate.      [place], DO 10/24/15 [phone]

## 2015-10-24 NOTE — Progress Notes (Signed)
Pt left hospital AMA. Pt taken out via wheelchair d/t him not having his walker at bedside.

## 2015-10-24 NOTE — ED Notes (Signed)
Multiple attempts to reach Dr. Geralynn Ochs (dentist on call), unsuccessfully

## 2015-10-24 NOTE — Progress Notes (Signed)
Pt came to the floor and stated he did not want to be disturbed. He states he is going to sleep and that staff needs to let him sleep because he has not slept in 3 days. Pt refused tele, yellow socks, and scds. Pt states he will put these items on after he gets some sleep. Will continue to encourage pt to put on monitoring devices and safety devices.

## 2015-10-24 NOTE — Progress Notes (Signed)
ANTIBIOTIC CONSULT NOTE-Preliminary  Pharmacy Consult for zosyn Indication: dental manipulation  Allergies  Allergen Reactions  . Morphine And Related   . Multaq [Dronedarone]   . Penicillins Other (See Comments)    Unknown  . Latex Rash  . Sulfonamide Derivatives Rash  . Testosterone Rash    The cream form causes a rash when rubbed on skin.     Patient Measurements: Height: _0  (177.8 cm) Weight: 200 lb (90.719 kg) IBW/kg (Calculated) : 73  Vital Signs: BP: 116/80 mmHg (11/08 0329) Pulse Rate: 84 (11/08 0329)  Labs:  Recent Labs  10/23/15 1753 10/24/15 0052  WBC 7.9 7.8  HGB 13.1 12.3*  PLT 280 275  CREATININE  --  0.88    Estimated Creatinine Clearance: 59.4 mL/min (by C-G formula based on Cr of 0.88).  No results for input(s): VANCOTROUGH, VANCOPEAK, VANCORANDOM, GENTTROUGH, GENTPEAK, GENTRANDOM, TOBRATROUGH, TOBRAPEAK, TOBRARND, AMIKACINPEAK, AMIKACINTROU, AMIKACIN in the last 72 hours.   Microbiology: Recent Results (from the past 720 hour(s))  Culture, blood (routine x 2)     Status: None (Preliminary result)   Collection Time: 10/24/15  2:30 AM  Result Value Ref Range Status   Specimen Description BLOOD RIGHT ARM  Final   Special Requests   Final    BOTTLES DRAWN AEROBIC AND ANAEROBIC AEB 4CC ANA 2CC   Culture PENDING  Incomplete   Report Status PENDING  Incomplete  Culture, blood (routine x 2)     Status: None (Preliminary result)   Collection Time: 10/24/15  2:39 AM  Result Value Ref Range Status   Specimen Description BLOOD LEFT ANTECUBITAL  Final   Special Requests   Final    BOTTLES DRAWN AEROBIC AND ANAEROBIC AEB 4CC ANA 2CC   Culture PENDING  Incomplete   Report Status PENDING  Incomplete    Medical History: Past Medical History  Diagnosis Date  . GERD (gastroesophageal reflux disease)   . COPD (chronic obstructive pulmonary disease) ([place])   . Glaucoma   . Hypothyroid   . Pacemaker 09/20/2010     ST Jude Accent DR RF device  MODEL  #PN2210,SERIAL #[id number]  . Glaucoma   . Pulmonary HTN ([facility])     DUE TO CHRONIC HYPOXIA   FROM COPD  . CHF (congestive heart failure) ([facility])     right heart failure and pulm htn due to chronic hypoxia from COPD  . Atrial fib/flutter, transient     not on warfarin due to risk of falls ,on aspirin for prophylaxis  . Sinus node dysfunction ([place])     Dual-chamber permanent pacermaker ST JUDE  . Gout     Medications:  Scheduled:    Assessment: 81 yo male in ED for second time today with bleeding from tooth extraction he had earlier today.   Goal of Therapy:  prevention/eradication fo infeciton  Plan:  Preliminary review of pertinent patient information completed.  Protocol will be initiated with a one-time dose(s) of zosyn 3.375 grams x 1.  Forestine Na clinical pharmacist will complete review during morning rounds to assess patient and finalize treatment regimen.  Nyra Capes, Ewa Gentry 10/24/2015,4:01 AM

## 2015-10-24 NOTE — ED Notes (Signed)
Pt had tooth extraction today and since then mouth has been bleeding. Pt was seen earlier today for the same.

## 2015-10-24 NOTE — Progress Notes (Signed)
Early morning meds given with small sip of water. Explained to pt that speech therapy would be in this morning to assess his swallowing. Reviewed pt's plan of care for today. Pt and wife verbalized understanding. Pt allowed tele and SCD's to be applied at this time.

## 2015-10-24 NOTE — Progress Notes (Signed)
Pt's daughter has arrived and cannot convince pt to stay in hospital. Daughter states that when pt gets like this that "we never tell him no". AMA paper brought to pt to sign but pt refused. Wife signed AMA paper.Tele and IV removed.

## 2015-10-24 NOTE — H&P (Signed)
Patient Demographics  Dustin Blankenship, is a 81 y.o. male  MRN: [id number]   DOB - [birth year]  Admit Date - 10/24/2015  Outpatient Primary MD for the patient is [name], MD   With History of -  Past Medical History  Diagnosis Date  . GERD (gastroesophageal reflux disease)   . COPD (chronic obstructive pulmonary disease) ([place])   . Glaucoma   . Hypothyroid   . Pacemaker 09/20/2010     ST Jude Accent DR RF device  MODEL #[id number],SERIAL #[id number]  . Glaucoma   . Pulmonary HTN (HCC)     DUE TO CHRONIC HYPOXIA   FROM COPD  . CHF (congestive heart failure) (HCC)     right heart failure and pulm htn due to chronic hypoxia from COPD  . Atrial fib/flutter, transient     not on warfarin due to risk of falls ,on aspirin for prophylaxis  . Sinus node dysfunction ([place])     Dual-chamber permanent pacermaker ST JUDE  . Gout       Past Surgical History  Procedure Laterality Date  . Doppler echocardiography  07/25/2011    EF =50-55%  . Nm myocar perf wall motion  07/25/2011  . Insert / replace / remove pacemaker  09/20/2010    ST JUDE ACCENT-dual chamber ;    . Lung biopsy    . Colonoscopy w/ polypectomy    . Esophagogastroduodenoscopy N/A 02/24/2015    Procedure: ESOPHAGOGASTRODUODENOSCOPY (EGD);  Surgeon: [name], MD;  Location: [facility];  Service: Endoscopy;  Laterality: N/A;  10:00  . Esophageal dilation N/A 02/24/2015    Procedure: ESOPHAGEAL DILATION;  Surgeon: [name], MD;  Location: [facility];  Service: Endoscopy;  Laterality: N/A;    in for   Chief Complaint  Patient presents with  . Coagulation Disorder     HPI  Dustin Blankenship  is a 81 y.o. male, with past medical history of COPD on home oxygen at bedtime, sick sinus syndrome status post permanent pacemaker, [place], Atrial fibrillation not on anticoagulation secondary to fall risk, glaucoma, GERD, hypothyroidism, CHF with cor pulmonale, Patient presents initially with right upper gum bleeds  status post tooth extraction afternoon yesterday, initial visit was for gum bleed,, treated with lidocaine and epinephrine injection, had recurrence of bleed after he went home, so he came back to ED, as well as time he endorses worsening dyspnea, worsening cough, chest x-ray showing left lung opacity suspicious for aspiration, patient is on Plavix which has not been held prior to tooth extraction, denies any other types of anticoagulation,  initially bleeding was not responsive to epinephrine injection, but had stopped after patient been treated with topical tranexamic acid, patient denies any chest pain , fever or chills , I was called to admit .   as well patient's daughter at bedside, reports he has problem with swallowing in the past .   Review of Systems    In addition to the HPI above, No Fever-chills, No Headache, No changes with Vision or hearing history of dysphagia Denies chest t pain,  complaints of Cough and  Shortness of Breath, No Abdominal pain, No Nausea or Vommitting, Bowel movements are regular, No Blood in stool or Urine, No dysuria, No new skin rashes or bruises, No new joints pains-aches,  No new weakness, tingling, numbness in any extremity, No recent weight gain or loss, No polyuria, polydypsia or polyphagia, significant gum bleed after tooth extraction  No significant Mental Stressors.  A full 10 point Review of Systems was done, except as stated above, all other Review of Systems were negative.   Social History Social History  Substance Use Topics  . Smoking status: Former Smoker -- 3.00 packs/day for 18 years    Types: Cigarettes    Quit date: 12/16/1953  . Smokeless tobacco: Never Used  . Alcohol Use: No     Family History History reviewed. No pertinent family history. Family history significant for hypertension  Prior to Admission medications   Medication Sig Start Date End Date Taking? Authorizing Provider  Acetaminophen (TYLENOL ARTHRITIS PAIN  PO) Take 650 mg by mouth 4 (four) times daily.    Historical Provider, MD  allopurinol (ZYLOPRIM) 300 MG tablet Take 300 mg by mouth daily.    Historical Provider, MD  clopidogrel (PLAVIX) 75 MG tablet Take 75 mg by mouth daily.  02/27/15   Historical Provider, MD  COMBIGAN 0.2-0.5 % ophthalmic solution  10/17/15   Historical Provider, MD  donepezil (ARICEPT) 10 MG tablet Take 1 tablet by mouth daily. 03/02/14   Historical Provider, MD  escitalopram (LEXAPRO) 10 MG tablet Take 10 mg by mouth at bedtime.  01/25/15   Historical Provider, MD  ipratropium (ATROVENT) 0.06 % nasal spray Place 2 sprays into both nostrils 2 (two) times daily.     Historical Provider, MD  Ketotifen Fumarate (THERA TEARS ALLERGY OP) Apply to eye. 3 drops in both eyes at least once a day per patient wife. Left eye.    Historical Provider, MD  levothyroxine (SYNTHROID, LEVOTHROID) 125 MCG tablet Take 125 mcg by mouth daily.    Historical Provider, MD  LORazepam (ATIVAN) 1 MG tablet TAKE ONE TABLET BY MOUTH TWICE DAILY AS NEEDED. 10/04/15   [name], MD  nitroGLYCERIN (NITROSTAT) 0.4 MG SL tablet Place 0.4 mg under the tongue every 5 (five) minutes as needed. Chest Pain    Historical Provider, MD  pilocarpine (PILOCAR) 1 % ophthalmic solution  09/26/15   Historical Provider, MD  Polyethyl Glycol-Propyl Glycol (SYSTANE) 0.4-0.3 % GEL Apply 1-2 drops to eye. Per wife sometimes he may use 3 drops in the right eye.    Historical Provider, MD  polyethylene glycol (MIRALAX / GLYCOLAX) packet Take 17 g by mouth daily as needed.     Historical Provider, MD  saw palmetto 500 MG capsule Take 500 mg by mouth 2 (two) times daily.     Historical Provider, MD  traZODone (DESYREL) 50 MG tablet  10/16/15   Historical Provider, MD    Allergies  Allergen Reactions  . Morphine And Related   . Multaq [Dronedarone]   . Penicillins Other (See Comments)    Unknown  . Latex Rash  . Sulfonamide Derivatives Rash  . Testosterone Rash    The  cream form causes a rash when rubbed on skin.     Physical Exam  Vitals  Blood pressure 116/80, pulse 84, resp. rate 16, height _0  (1.778 m), weight 90.719 kg (200 lb), SpO2 92 %.   1. General frail elderly male lying in bed in NAD,   2. Normal affect and insight, Not Suicidal or Homicidal, Awake Alert, Oriented X 3.  3. No F.N deficits, ALL C.Nerves Intact, Strength 5/5 all 4 extremities, Sensation intact all 4 extremities, Plantars down going.  4.  Patient having dried blood in mouth, no further bleeding from tooth extraction and right upper jaw.  5. Supple Neck, No JVD, No cervical lymphadenopathy appriciated, No Carotid Bruits.  6.  Symmetrical Chest wall movement, Good air movement bilaterally, CTAB.  7.  No Gallops, Rubs or Murmurs, No Parasternal Heave.  8. Positive Bowel Sounds, Abdomen Soft, No tenderness, No organomegaly appriciated,No rebound -guarding or rigidity.  9.  No Cyanosis, Normal Skin Turgor, No Skin Rash or Bruise.  10. Good muscle tone,  joints appear normal , no effusions, Normal ROM.      Data Review  CBC  Recent Labs Lab 10/23/15 1753 10/24/15 0052  WBC 7.9 7.8  HGB 13.1 12.3*  HCT 39.1 37.2*  PLT 280 275  MCV 92.2 92.3  MCH 30.9 30.5  MCHC 33.5 33.1  RDW 13.6 13.8  LYMPHSABS 1.5 1.2  MONOABS 0.7 1.2*  EOSABS 0.4 0.3  BASOSABS 0.0 0.0   ------------------------------------------------------------------------------------------------------------------  Chemistries   Recent Labs Lab 10/24/15 0052  NA 139  K 4.1  CL 101  CO2 31  GLUCOSE 101*  BUN 24*  CREATININE 0.88  CALCIUM 9.2   ------------------------------------------------------------------------------------------------------------------ estimated creatinine clearance is 59.4 mL/min (by C-G formula based on Cr of 0.88). ------------------------------------------------------------------------------------------------------------------ No results for input(s): TSH,  T4TOTAL, T3FREE, THYROIDAB in the last 72 hours.  Invalid input(s): FREET3   Coagulation profile  Recent Labs Lab 10/24/15 0052  INR 1.13   ------------------------------------------------------------------------------------------------------------------- No results for input(s): DDIMER in the last 72 hours. -------------------------------------------------------------------------------------------------------------------  Cardiac Enzymes No results for input(s): CKMB, TROPONINI, MYOGLOBIN in the last 168 hours.  Invalid input(s): CK ------------------------------------------------------------------------------------------------------------------ Invalid input(s): POCBNP   ---------------------------------------------------------------------------------------------------------------  Urinalysis    Component Value Date/Time   COLORURINE YELLOW 05/14/2011 1028   APPEARANCEUR CLEAR 05/14/2011 1028   LABSPEC 1.015 05/14/2011 1028   PHURINE 5.5 05/14/2011 1028   GLUCOSEU NEGATIVE 05/14/2011 1028   HGBUR NEGATIVE 05/14/2011 1028   BILIRUBINUR NEGATIVE 05/14/2011 1028   KETONESUR NEGATIVE 05/14/2011 1028   PROTEINUR NEGATIVE 05/14/2011 1028   UROBILINOGEN 0.2 05/14/2011 1028   NITRITE NEGATIVE 05/14/2011 1028   LEUKOCYTESUR  05/14/2011 1028    NEGATIVE MICROSCOPIC NOT DONE ON URINES WITH NEGATIVE PROTEIN, BLOOD, LEUKOCYTES, NITRITE, OR GLUCOSE <1000 mg/dL.    ----------------------------------------------------------------------------------------------------------------  Imaging results:   Dg Chest Portable 1 View  10/24/2015  CLINICAL DATA:  Cough, aspiration after tooth extraction today. History of aspiration. History of CHF, COPD. EXAM: PORTABLE CHEST 1 VIEW COMPARISON:  Chest radiograph May 14, 2011 FINDINGS: Cardiac silhouette is mildly enlarged unchanged. Calcified aortic knob. Patchy LEFT midlung zone and airspace opacities. Fullness of bilateral pulmonary hila and  pulmonary vascular congestion. No pleural effusion. No pneumothorax. Dual lead LEFT cardiac pacemaker in situ. Loop monitor projects in LEFT chest. Soft tissue planes and included osseous structure are nonsuspicious. Tiny calcification projecting at LEFT humeral head can be seen with calcific tendinopathy. IMPRESSION: LEFT mid and lower lung zone airspace opacities concerning for pneumonia. Mild cardiomegaly and pulmonary vascular congestion. Fullness of the hila may represent prominent vessels or, lymphadenopathy. Electronically Signed   By: Elon Alas M.D.   On: 10/24/2015 01:44       Assessment & Plan  Active Problems:   COPD (chronic obstructive pulmonary disease) (HCC)   Dysphagia   Pneumonia   Gums, bleeding   Pneumoia - This is most likely aspiration pneumonia  from continuous gum bleeding after tooth extraction . - Start patient on IV Zosyn , continue with nebs as needed . - Will have a swallow evaluation given his to of dysphagia.  Gum bleeding  - Secondary to tooth extraction , continue to hold Plavix ,  will keep patient nothing by  mouth initially, then will advance to clear liquid diet if cleared by SLP .  History of COPD  - No active wheezing , continue with nebs as needed .  History of A. fib  - Not anticoagulation candidate due to high risk for falls  - Monitor on telemetry    history of sick sinus syndrome  - That is post permanent pacemaker   History of glaucoma - Continue with home medication    DVT Prophylaxis SCD  AM Labs Ordered, also please review Full Orders  Family Communication: Admission, patients condition and plan of care including tests being ordered have been discussed with the patient and wife/daughter who indicate understanding and agree with the plan and Code Status.  Code Status Full  Likely DC to  Home when stable  Condition GUARDED    Time spent in minutes : 60 minutes    ELGERGAWY, DAWOOD M.D on 10/24/2015 at 4:16  AM  Between 7am to 7pm - Pager - [phone]  After 7pm go to [email or web address] - password [email or web address]  And look for the night coverage person covering me after hours  [facility] Office  [phone]

## 2015-10-24 NOTE — Progress Notes (Signed)
ANTIBIOTIC CONSULT NOTE-Initial consult  Pharmacy Consult for zosyn Indication: Pneumonia  Allergies  Allergen Reactions  . Morphine And Related   . Multaq [Dronedarone]   . Penicillins Other (See Comments)    Unknown  . Latex Rash  . Sulfonamide Derivatives Rash  . Testosterone Rash    The cream form causes a rash when rubbed on skin.     Patient Measurements: Height: _0  (177.8 cm) Weight: 195 lb 14.4 oz (88.86 kg) IBW/kg (Calculated) : 73  Vital Signs: Temp: 97.5 F (36.4 C) (11/08 0504) Temp Source: Oral (11/08 0504) BP: 97/48 mmHg (11/08 0504) Pulse Rate: 98 (11/08 0504)  Labs:  Recent Labs  10/23/15 1753 10/24/15 0052 10/24/15 0634  WBC 7.9 7.8 8.4  HGB 13.1 12.3* 11.4*  PLT 280 275 255  CREATININE  --  0.88 0.91    Estimated Creatinine Clearance: 57 mL/min (by C-G formula based on Cr of 0.91).  No results for input(s): VANCOTROUGH, VANCOPEAK, VANCORANDOM, GENTTROUGH, GENTPEAK, GENTRANDOM, TOBRATROUGH, TOBRAPEAK, TOBRARND, AMIKACINPEAK, AMIKACINTROU, AMIKACIN in the last 72 hours.   Microbiology: Recent Results (from the past 720 hour(s))  Culture, blood (routine x 2)     Status: None (Preliminary result)   Collection Time: 10/24/15  2:30 AM  Result Value Ref Range Status   Specimen Description BLOOD RIGHT ARM  Final   Special Requests   Final    BOTTLES DRAWN AEROBIC AND ANAEROBIC AEB 4CC ANA 2CC   Culture PENDING  Incomplete   Report Status PENDING  Incomplete  Culture, blood (routine x 2)     Status: None (Preliminary result)   Collection Time: 10/24/15  2:39 AM  Result Value Ref Range Status   Specimen Description BLOOD LEFT ANTECUBITAL  Final   Special Requests   Final    BOTTLES DRAWN AEROBIC AND ANAEROBIC AEB 4CC ANA 2CC   Culture PENDING  Incomplete   Report Status PENDING  Incomplete    Medical History: Past Medical History  Diagnosis Date  . GERD (gastroesophageal reflux disease)   . COPD (chronic obstructive pulmonary disease)  ([place])   . Glaucoma   . Hypothyroid   . Pacemaker 09/20/2010     ST Jude Accent DR RF device  MODEL #[id number],SERIAL #[id number]  . Glaucoma   . Pulmonary HTN (HCC)     DUE TO CHRONIC HYPOXIA   FROM COPD  . CHF (congestive heart failure) ([facility])     right heart failure and pulm htn due to chronic hypoxia from COPD  . Atrial fib/flutter, transient     not on warfarin due to risk of falls ,on aspirin for prophylaxis  . Sinus node dysfunction ([place])     Dual-chamber permanent pacermaker ST JUDE  . Gout     Medications:  Scheduled:  . allopurinol  300 mg Oral Daily  . donepezil  10 mg Oral Daily  . escitalopram  10 mg Oral QHS  . guaiFENesin  1,200 mg Oral BID  . ketotifen  1 drop Left Eye BID  . levothyroxine  125 mcg Oral QAC breakfast  . methylPREDNISolone (SOLU-MEDROL) injection  40 mg Intravenous Q12H  . piperacillin-tazobactam (ZOSYN)  IV  3.375 g Intravenous Once  . polyvinyl alcohol  1 drop Right Eye TID  . sodium chloride  3 mL Intravenous Q12H  . traZODone  50 mg Oral QHS    Assessment: 81 yo male in ED for second time today with bleeding from tooth extraction he had earlier today. He reports worsening dyspnea, cough and  is having difficulty swallowing. Chest x-ray showing left lung opacity suspicious for aspiration. No fever, chills, or chest pain.   Goal of Therapy:  Eradication of infection  Plan:  Zosyn 3.375gm iv q8h, extended dosing interval Follow up cultures Monitor V/S and labs  Isac Sarna, BS Vena [place], BCPS Clinical Pharmacist Pager [phone] 10/24/2015,9:02 AM

## 2015-10-24 NOTE — Progress Notes (Signed)
Subjective: He was admitted early this morning with pneumonia. He may be aspirating. He has multiple other medical problems including heart disease and has apparently been having chest pain at home. He has had some hemoptysis.  Objective: Vital signs in last 24 hours: Temp:  [97.5 F (36.4 C)] 97.5 F (36.4 C) (11/08 0504) Pulse Rate:  [70-100] 98 (11/08 0504) Resp:  [16-18] 18 (11/08 0504) BP: (97-154)/(48-115) 97/48 mmHg (11/08 0504) SpO2:  [91 %-100 %] 97 % (11/08 0504) Weight:  [88.86 kg (195 lb 14.4 oz)-90.719 kg (200 lb)] 88.86 kg (195 lb 14.4 oz) (11/08 0504) Weight change:     Intake/Output from previous day: 11/07 0701 - 11/08 0700 In: 115.8 [I.V.:65.8; IV Piggyback:50] Out: -   PHYSICAL EXAM General appearance: alert, cooperative and mild distress Resp: rhonchi bilaterally Cardio: regular rate and rhythm, S1, S2 normal, no murmur, click, rub or gallop GI: soft, non-tender; bowel sounds normal; no masses,  no organomegaly Extremities: extremities normal, atraumatic, no cyanosis or edema  Lab Results:  Results for orders placed or performed during the hospital encounter of 10/24/15 (from the past 48 hour(s))  CBC with Differential     Status: Abnormal   Collection Time: 10/24/15 12:52 AM  Result Value Ref Range   WBC 7.8 4.0 - 10.5 K/uL   RBC 4.03 (L) 4.22 - 5.81 MIL/uL   Hemoglobin 12.3 (L) 13.0 - 17.0 g/dL   HCT 37.2 (L) 39.0 - 52.0 %   MCV 92.3 78.0 - 100.0 fL   MCH 30.5 26.0 - 34.0 pg   MCHC 33.1 30.0 - 36.0 g/dL   RDW 13.8 11.5 - 15.5 %   Platelets 275 150 - 400 K/uL   Neutrophils Relative % 66 %   Neutro Abs 5.1 1.7 - 7.7 K/uL   Lymphocytes Relative 15 %   Lymphs Abs 1.2 0.7 - 4.0 K/uL   Monocytes Relative 15 %   Monocytes Absolute 1.2 (H) 0.1 - 1.0 K/uL   Eosinophils Relative 4 %   Eosinophils Absolute 0.3 0.0 - 0.7 K/uL   Basophils Relative 0 %   Basophils Absolute 0.0 0.0 - 0.1 K/uL  APTT     Status: None   Collection Time: 10/24/15 12:52 AM   Result Value Ref Range   aPTT 27 24 - 37 seconds  Protime-INR     Status: None   Collection Time: 10/24/15 12:52 AM  Result Value Ref Range   Prothrombin Time 14.7 11.6 - 15.2 seconds   INR 1.13 0.00 - 1.62  Basic metabolic panel     Status: Abnormal   Collection Time: 10/24/15 12:52 AM  Result Value Ref Range   Sodium 139 135 - 145 mmol/L   Potassium 4.1 3.5 - 5.1 mmol/L   Chloride 101 101 - 111 mmol/L   CO2 31 22 - 32 mmol/L   Glucose, Bld 101 (H) 65 - 99 mg/dL   BUN 24 (H) 6 - 20 mg/dL   Creatinine, Ser 0.88 0.61 - 1.24 mg/dL   Calcium 9.2 8.9 - 10.3 mg/dL   GFR calc non Af Amer >60 >60 mL/min   GFR calc Af Amer >60 >60 mL/min    Comment: (NOTE) The eGFR has been calculated using the CKD EPI equation. This calculation has not been validated in all clinical situations. eGFR's persistently <60 mL/min signify possible Chronic Kidney Disease.    Anion gap 7 5 - 15  Sample to Blood Bank     Status: None   Collection Time: 10/24/15  12:52 AM  Result Value Ref Range   Blood Bank Specimen SAMPLE AVAILABLE FOR TESTING    Sample Expiration 10/27/2015   Culture, blood (routine x 2)     Status: None (Preliminary result)   Collection Time: 10/24/15  2:30 AM  Result Value Ref Range   Specimen Description BLOOD RIGHT ARM    Special Requests      BOTTLES DRAWN AEROBIC AND ANAEROBIC AEB 4CC ANA [place]   Culture PENDING    Report Status PENDING   Culture, blood (routine x 2)     Status: None (Preliminary result)   Collection Time: 10/24/15  2:39 AM  Result Value Ref Range   Specimen Description BLOOD LEFT ANTECUBITAL    Special Requests      BOTTLES DRAWN AEROBIC AND ANAEROBIC AEB 4CC ANA [place]   Culture PENDING    Report Status PENDING   CBC     Status: Abnormal   Collection Time: 10/24/15  6:34 AM  Result Value Ref Range   WBC 8.4 4.0 - 10.5 K/uL   RBC 3.76 (L) 4.22 - 5.81 MIL/uL   Hemoglobin 11.4 (L) 13.0 - 17.0 g/dL   HCT 34.8 (L) 39.0 - 52.0 %   MCV 92.6 78.0 - 100.0 fL    MCH 30.3 26.0 - 34.0 pg   MCHC 32.8 30.0 - 36.0 g/dL   RDW 13.8 11.5 - 15.5 %   Platelets 255 150 - 400 K/uL  Basic metabolic panel     Status: Abnormal   Collection Time: 10/24/15  6:34 AM  Result Value Ref Range   Sodium 139 135 - 145 mmol/L   Potassium 4.3 3.5 - 5.1 mmol/L   Chloride 102 101 - 111 mmol/L   CO2 31 22 - 32 mmol/L   Glucose, Bld 107 (H) 65 - 99 mg/dL   BUN 29 (H) 6 - 20 mg/dL   Creatinine, Ser 0.91 0.61 - 1.24 mg/dL   Calcium 8.8 (L) 8.9 - 10.3 mg/dL   GFR calc non Af Amer >60 >60 mL/min   GFR calc Af Amer >60 >60 mL/min    Comment: (NOTE) The eGFR has been calculated using the CKD EPI equation. This calculation has not been validated in all clinical situations. eGFR's persistently <60 mL/min signify possible Chronic Kidney Disease.    Anion gap 6 5 - 15    ABGS No results for input(s): PHART, PO2ART, TCO2, HCO3 in the last 72 hours.  Invalid input(s): PCO2 CULTURES Recent Results (from the past 240 hour(s))  Culture, blood (routine x 2)     Status: None (Preliminary result)   Collection Time: 10/24/15  2:30 AM  Result Value Ref Range Status   Specimen Description BLOOD RIGHT ARM  Final   Special Requests   Final    BOTTLES DRAWN AEROBIC AND ANAEROBIC AEB 4CC ANA [place]   Culture PENDING  Incomplete   Report Status PENDING  Incomplete  Culture, blood (routine x 2)     Status: None (Preliminary result)   Collection Time: 10/24/15  2:39 AM  Result Value Ref Range Status   Specimen Description BLOOD LEFT ANTECUBITAL  Final   Special Requests   Final    BOTTLES DRAWN AEROBIC AND ANAEROBIC AEB 4CC ANA 2CC   Culture PENDING  Incomplete   Report Status PENDING  Incomplete   Studies/Results: Dg Chest Portable 1 View  10/24/2015  CLINICAL DATA:  Cough, aspiration after tooth extraction today. History of aspiration. History of CHF, COPD. EXAM: PORTABLE CHEST 1  VIEW COMPARISON:  Chest radiograph May 14, 2011 FINDINGS: Cardiac silhouette is mildly enlarged  unchanged. Calcified aortic knob. Patchy LEFT midlung zone and airspace opacities. Fullness of bilateral pulmonary hila and pulmonary vascular congestion. No pleural effusion. No pneumothorax. Dual lead LEFT cardiac pacemaker in situ. Loop monitor projects in LEFT chest. Soft tissue planes and included osseous structure are nonsuspicious. Tiny calcification projecting at LEFT humeral head can be seen with calcific tendinopathy. IMPRESSION: LEFT mid and lower lung zone airspace opacities concerning for pneumonia. Mild cardiomegaly and pulmonary vascular congestion. Fullness of the hila may represent prominent vessels or, lymphadenopathy. Electronically Signed   By: Elon Alas M.D.   On: 10/24/2015 01:44    Medications:  Prior to Admission:  Prescriptions prior to admission  Medication Sig Dispense Refill Last Dose  . Acetaminophen (TYLENOL ARTHRITIS PAIN PO) Take 650 mg by mouth 4 (four) times daily.   Taking  . allopurinol (ZYLOPRIM) 300 MG tablet Take 300 mg by mouth daily.   Taking  . clopidogrel (PLAVIX) 75 MG tablet Take 75 mg by mouth daily.    Taking  . COMBIGAN 0.2-0.5 % ophthalmic solution      . donepezil (ARICEPT) 10 MG tablet Take 1 tablet by mouth daily.   Taking  . escitalopram (LEXAPRO) 10 MG tablet Take 10 mg by mouth at bedtime.    Taking  . ipratropium (ATROVENT) 0.06 % nasal spray Place 2 sprays into both nostrils 2 (two) times daily.    Taking  . Ketotifen Fumarate (THERA TEARS ALLERGY OP) Apply to eye. 3 drops in both eyes at least once a day per patient wife. Left eye.   Taking  . levothyroxine (SYNTHROID, LEVOTHROID) 125 MCG tablet Take 125 mcg by mouth daily.   Taking  . LORazepam (ATIVAN) 1 MG tablet TAKE ONE TABLET BY MOUTH TWICE DAILY AS NEEDED. 60 tablet 0   . nitroGLYCERIN (NITROSTAT) 0.4 MG SL tablet Place 0.4 mg under the tongue every 5 (five) minutes as needed. Chest Pain   Taking  . pilocarpine (PILOCAR) 1 % ophthalmic solution      . Polyethyl Glycol-Propyl  Glycol (SYSTANE) 0.4-0.3 % GEL Apply 1-2 drops to eye. Per wife sometimes he may use 3 drops in the right eye.   Taking  . polyethylene glycol (MIRALAX / GLYCOLAX) packet Take 17 g by mouth daily as needed.    Taking  . saw palmetto 500 MG capsule Take 500 mg by mouth 2 (two) times daily.    Taking  . traZODone (DESYREL) 50 MG tablet       Scheduled: . allopurinol  300 mg Oral Daily  . donepezil  10 mg Oral Daily  . escitalopram  10 mg Oral QHS  . guaiFENesin  1,200 mg Oral BID  . ketotifen  1 drop Left Eye BID  . levothyroxine  125 mcg Oral QAC breakfast  . methylPREDNISolone (SOLU-MEDROL) injection  40 mg Intravenous Q12H  . piperacillin-tazobactam (ZOSYN)  IV  3.375 g Intravenous Once  . polyvinyl alcohol  1 drop Right Eye TID  . sodium chloride  3 mL Intravenous Q12H  . traZODone  50 mg Oral QHS   Continuous: . sodium chloride 50 mL/hr at 10/24/15 0541   ERX:[id number] **OR** acetaminophen, albuterol, LORazepam, nitroGLYCERIN, ondansetron (ZOFRAN) IV, polyethylene glycol  Assesment: He has pneumonia and COPD exacerbation. He's had trouble swallowing so he is going to have speech consultation. At home he's been having chest pains on going to have him get an echocardiogram. Active  Problems:   COPD (chronic obstructive pulmonary disease) (HCC)   Dysphagia   Pneumonia   Gums, bleeding    Plan: Continue IV antibiotics. Speech consultation. Continue with other treatments.    LOS: 0 days   Naeemah Jasmer L 10/24/2015, 8:54 AM

## 2015-10-24 NOTE — Progress Notes (Signed)
Brought pt his 10am meds. Pt very agitated and angry at hospital staff for not meeting his needs. C/o light coming through the blind hurting his eyes. Moved bed as far away from light source as possible. Pt c/o that he is going home because staff is not doing anything for him. Explained that this nurse had his morning meds and tried to review them with pt and wife. Pt continuously interrupted this nurse attempts to help meet his requests/needs. Pt speaking angrily to nursing staff and his wife. Pt states he has called his daughter who is coming with his clothes to take him home. Explained to pt that his condition would be better treated in the hospital but pt became angrier and more beligerent with each attempt to help. Dr [name] notified of pt's behavior and demands to go home. Ativan ordered if pt willing to take. Dr. [name] understands that pt may still choose to leave.

## 2015-10-25 NOTE — Discharge Summary (Signed)
Physician Discharge Summary  Patient ID: Dustin Blankenship MRN: [id number] DOB/AGE: [birth year] 81 y.o. Primary Care Physician:Maritta Kief L, MD Admit date: 10/24/2015 Discharge date: 10/25/2015    Discharge Diagnoses:   Active Problems:   COPD (chronic obstructive pulmonary disease) (HCC)   Dysphagia   Pneumonia   Gums, bleeding  paroxysmal atrial fibrillation Dementia Glaucoma Depression Hypothyroidism Anxiety Coronary artery occlusive disease BPH   Medication List    ASK your doctor about these medications        allopurinol 300 MG tablet  Commonly known as:  ZYLOPRIM  Take 300 mg by mouth daily.     clopidogrel 75 MG tablet  Commonly known as:  PLAVIX  Take 75 mg by mouth daily.     COMBIGAN 0.2-0.5 % ophthalmic solution  Generic drug:  brimonidine-timolol     donepezil 10 MG tablet  Commonly known as:  ARICEPT  Take 1 tablet by mouth daily.     escitalopram 10 MG tablet  Commonly known as:  LEXAPRO  Take 10 mg by mouth at bedtime.     ipratropium 0.06 % nasal spray  Commonly known as:  ATROVENT  Place 2 sprays into both nostrils 2 (two) times daily.     levothyroxine 125 MCG tablet  Commonly known as:  SYNTHROID, LEVOTHROID  Take 125 mcg by mouth daily.     LORazepam 1 MG tablet  Commonly known as:  ATIVAN  TAKE ONE TABLET BY MOUTH TWICE DAILY AS NEEDED.     nitroGLYCERIN 0.4 MG SL tablet  Commonly known as:  NITROSTAT  Place 0.4 mg under the tongue every 5 (five) minutes as needed. Chest Pain     pilocarpine 1 % ophthalmic solution  Commonly known as:  PILOCAR     polyethylene glycol packet  Commonly known as:  MIRALAX / GLYCOLAX  Take 17 g by mouth daily as needed.     saw palmetto 500 MG capsule  Take 500 mg by mouth 2 (two) times daily.     SYSTANE 0.4-0.3 % Gel ophthalmic gel  Generic drug:  Polyethyl Glycol-Propyl Glycol  Apply 1-2 drops to eye. Per wife sometimes he may use 3 drops in the right eye.     THERA TEARS ALLERGY OP   Apply to eye. 3 drops in both eyes at least once a day per patient wife. Left eye.     traZODone 50 MG tablet  Commonly known as:  DESYREL     TYLENOL ARTHRITIS PAIN PO  Take 650 mg by mouth 4 (four) times daily.        Discharged Condition: Unchanged    Consults: None  Significant Diagnostic Studies: Dg Chest Portable 1 View  10/24/2015  CLINICAL DATA:  Cough, aspiration after tooth extraction today. History of aspiration. History of CHF, COPD. EXAM: PORTABLE CHEST 1 VIEW COMPARISON:  Chest radiograph May 14, 2011 FINDINGS: Cardiac silhouette is mildly enlarged unchanged. Calcified aortic knob. Patchy LEFT midlung zone and airspace opacities. Fullness of bilateral pulmonary hila and pulmonary vascular congestion. No pleural effusion. No pneumothorax. Dual lead LEFT cardiac pacemaker in situ. Loop monitor projects in LEFT chest. Soft tissue planes and included osseous structure are nonsuspicious. Tiny calcification projecting at LEFT humeral head can be seen with calcific tendinopathy. IMPRESSION: LEFT mid and lower lung zone airspace opacities concerning for pneumonia. Mild cardiomegaly and pulmonary vascular congestion. Fullness of the hila may represent prominent vessels or, lymphadenopathy. Electronically Signed   By: Elon Alas M.D.   On: 10/24/2015 01:44  Lab Results: Basic Metabolic Panel:  Recent Labs  10/24/15 0052 10/24/15 0634  NA 139 139  K 4.1 4.3  CL 101 102  CO2 31 31  GLUCOSE 101* 107*  BUN 24* 29*  CREATININE 0.88 0.91  CALCIUM 9.2 8.8*   Liver Function Tests: No results for input(s): AST, ALT, ALKPHOS, BILITOT, PROT, ALBUMIN in the last 72 hours.   CBC:  Recent Labs  10/23/15 1753 10/24/15 0052 10/24/15 0634  WBC 7.9 7.8 8.4  NEUTROABS 5.3 5.1  --   HGB 13.1 12.3* 11.4*  HCT 39.1 37.2* 34.8*  MCV 92.2 92.3 92.6  PLT 280 275 255    Recent Results (from the past 240 hour(s))  Culture, blood (routine x 2)     Status: None  (Preliminary result)   Collection Time: 10/24/15  2:30 AM  Result Value Ref Range Status   Specimen Description BLOOD RIGHT ARM  Final   Special Requests   Final    BOTTLES DRAWN AEROBIC AND ANAEROBIC AEB 4CC ANA [place]   Culture PENDING  Incomplete   Report Status PENDING  Incomplete  Culture, blood (routine x 2)     Status: None (Preliminary result)   Collection Time: 10/24/15  2:39 AM  Result Value Ref Range Status   Specimen Description BLOOD LEFT ANTECUBITAL  Final   Special Requests   Final    BOTTLES DRAWN AEROBIC AND ANAEROBIC AEB 4CC ANA [employer]   Culture PENDING  Incomplete   Report Status PENDING  Incomplete     Hospital Course: This is a 81 year old who came to the emergency room with shortness of breath and hemoptysis. He was noted to have pneumonia in the emergency department and there was concern about his swallowing and that he might have aspirated. He has been on Plavix and he had some bleeding of his gums that is thought to be related to the Plavix and that is probably the source of his hemoptysis. He was started on intravenous antibiotics given IV fluids but became angry at being in the hospital and insisted on discharge. Although he does have a diagnosis of dementia he was clearly not confused. Nursing staff and his family tried to get him to stay but he would not. He signed out AMA  Discharge Exam: Blood pressure 97/48, pulse 98, temperature 97.5 F (36.4 C), temperature source Oral, resp. rate 18, height _0  (1.778 m), weight 88.86 kg (195 lb 14.4 oz), SpO2 97 %. He still has rhonchi bilaterally  Disposition: Home AGAINST MEDICAL ADVICE      Signed: Keyshun Elpers L   10/25/2015, 8:48 AM

## 2015-10-26 ENCOUNTER — Encounter: Payer: Self-pay | Admitting: *Deleted

## 2015-10-26 ENCOUNTER — Other Ambulatory Visit: Payer: Self-pay | Admitting: *Deleted

## 2015-10-26 NOTE — Patient Outreach (Signed)
[facility]) Care Management   10/26/2015  Dustin Blankenship 01/14/1920 [id number]  Dustin Blankenship is an 81 y.o. male who recently presented to the ED with shortness of breath and hemoptysis. He was noted to have pneumonia in the emergency department and swallowing difficulty with concern for aspiration. He was also noted to have bleeding of the gums. Dustin Blankenship is on Plavix and had not stopped it prior to a dental procedure that was performed on Monday. In the emergency department, he was started on IV antibiotics and given IV fluids. At some point, Dustin Blankenship became angry and insisted on discharge, against medical advice.   Dustin Blankenship self referred to [facility] at the request of his wife who is followed by [facility]. I am seeing Dustin Blankenship today at his home.   Subjective: "I don't know what you're talking about. I don't have pneumonia. They don't know what they're talking about."  Objective:  BP 128/76 mmHg  Pulse 70  Ht 1.778 m (_0 )  Wt 195 lb (88.451 kg)  BMI 27.98 kg/m2  SpO2 93%  Review of Systems  Constitutional: Positive for malaise/fatigue.  HENT: Negative.   Eyes: Negative.   Respiratory: Positive for cough. Negative for sputum production and wheezing.   Cardiovascular: Negative.   Gastrointestinal: Negative.   Genitourinary: Negative.   Musculoskeletal: Negative.  Negative for falls.  Skin: Negative.   Neurological: Positive for weakness.  Endo/Heme/Allergies: Bruises/bleeds easily.  Psychiatric/Behavioral: Positive for memory loss.    Physical Exam  Constitutional: Vital signs are normal. He appears well-developed and well-nourished. He has a sickly appearance.  Neck: No JVD present.  Cardiovascular: Normal rate.   Respiratory: Effort normal and breath sounds normal. No respiratory distress. He has no wheezes. He has no rales.  GI: Soft. Bowel sounds are normal.  Neurological: He is alert.  Skin: Skin is warm and dry.  Psychiatric:  His mood appears anxious. His affect is inappropriate. His speech is tangential. He is agitated. He expresses inappropriate judgment. He exhibits abnormal recent memory and abnormal remote memory.  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 He talked about his time time in the [place] frequently and for approximately 20 minutes or more during the entirety of our visit.     Current Medications:   Current Outpatient Prescriptions  Medication Sig Dispense Refill  . Acetaminophen (TYLENOL ARTHRITIS PAIN PO) Take 650 mg by mouth 4 (four) times daily.    Marland Kitchen allopurinol (ZYLOPRIM) 300 MG tablet Take 300 mg by mouth daily.    . clopidogrel (PLAVIX) 75 MG tablet Take 75 mg by mouth daily.     . COMBIGAN 0.2-0.5 % ophthalmic solution     . donepezil (ARICEPT) 10 MG tablet Take 1 tablet by mouth daily.    Marland Kitchen escitalopram (LEXAPRO) 10 MG tablet Take 10 mg by mouth at bedtime.     Marland Kitchen ipratropium (ATROVENT) 0.06 % nasal spray Place 2 sprays into both nostrils 2 (two) times daily.     Marland Kitchen Ketotifen Fumarate (THERA TEARS ALLERGY OP) Apply to eye. 3 drops in both eyes at least once a day per patient wife. Left eye.    . levothyroxine (SYNTHROID, LEVOTHROID) 125 MCG tablet Take 125 mcg by mouth daily.    Marland Kitchen LORazepam (ATIVAN) 1 MG tablet  TAKE ONE TABLET BY MOUTH TWICE DAILY AS NEEDED. 60 tablet 0  . nitroGLYCERIN (NITROSTAT) 0.4 MG SL tablet Place 0.4 mg under the tongue every 5 (five) minutes as needed. Chest Pain    . pilocarpine (PILOCAR) 1 % ophthalmic solution     . Polyethyl Glycol-Propyl Glycol (SYSTANE) 0.4-0.3 % GEL Apply 1-2 drops to eye. Per wife sometimes he may use 3 drops in the right eye.    . polyethylene glycol (MIRALAX / GLYCOLAX) packet Take 17 g by mouth daily as needed.     . saw palmetto 500 MG capsule Take  500 mg by mouth 2 (two) times daily.     . traZODone (DESYREL) 50 MG tablet      No current facility-administered medications for this visit.    Functional Status:   In your present state of health, do you have any difficulty performing the following activities: 10/24/2015  Hearing? Y  Vision? N  Difficulty concentrating or making decisions? N  Walking or climbing stairs? Y  Dressing or bathing? Y  Doing errands, shopping? N    Assessment:    Acute Health Condition (PNA) in patient with COPD - Dustin Blankenship just left the hospital on Tuesday morning. He is taking oral antibiotics. He says he "did not and do not now have pneumonia. They don't know what they're talking about. I had something wrong with my lungs while I was in the [facility] and it caused scarring that will always be there. They don't know what they're talking about." Dustin Blankenship wife says he is taking the antibiotic as prescribed and pill count confirms this. His color is good today. Vital signs are stable. Breath sounds are clear. I encouraged him to use his incentive spirometer twice daily. We reviewed signs and symptoms of respiratory problems. I encouraged regular intake of fluid and good nutrition and activity.   Level of Care Concerns - Dustin Blankenship lives in his home in [place] with his wife who has considerable health concerns of her own. Dustin Blankenship' daughter is visiting (but not staying in the home; staying in a large camper at a local [place]) from [name] and will be here through December. She is helping with transportation/doctor visits and some home and health management. However, when she leaves Dustin Blankenship is concerned about her ability to provide needed care for Dustin Blankenship. They moved to an ALF last year but Dustin Blankenship was dissatisfied with the arrangement and facilities and signed the two of them out. They have hired several professional in home care service providers but Dustin Blankenship has fired almost all of them.    Today, we discussed the various options for long term care resources. Dustin Blankenship and his family are to discuss the options privately and we will review on my next visit.   Plan:   [facility] CM Care Plan Problem One        Most Recent Value   Care Plan Problem One  Acute Health Condition (Pneumonia)    Role Documenting the Problem One  Care Management Coordinator   Care Plan for Problem One  Active   THN Long Term Goal (31-90 days)  Patient will not be admitted to the hospital for pneumonia over the next 31 days   THN Long Term Goal Start Date  10/26/15   Interventions for Problem One Long Term Goal  Utilizing teachback method, reviewed diagnosis and provider plan of care with patient,  set goal for no hospitalization   THN CM  Short Term Goal #1 (0-30 days)  Over the next 30 days patient will take medications exaclty as prescribed as evidenced by spouse report and pill count   THN CM Short Term Goal #1 Start Date  10/26/15   Interventions for Short Term Goal #1  Utiizing teachback method, reviewed medications,  referred to Pharmacy program for polypharmacy med review   THN CM Short Term Goal #2 (0-30 days)  Over the next 30 days, patient and/or spouse will verbalize understanding of signs and symptoms of worsening or new pulmonary symptoms   THN CM Short Term Goal #2 Start Date  10/26/15   Interventions for Short Term Goal #2  Utilizing teachback method and Emmi educational materials, revviewed signs and symptoms of worsening pulmonary condition and when to call provider    [facility] CM Care Plan Problem Two        Most Recent Value   Care Plan Problem Two  Level of Care Concerns   Role Documenting the Problem Two  Care Management [place] for Problem Two  Active   Interventions for Problem Two Long Term Goal   Utilizing teachback method, discussed with patient and spouse options for long term care to include pvt duty in home care, ALF vs SNF care   [facility] Long Term Goal (31-90) days  Over  the next 31 days, patient and family will verbalize understanding of options for long term care   THN Long Term Goal Start Date  10/26/15   THN CM Short Term Goal #1 (0-30 days)  Over the next 30 days, patient and family will discuss privately options for long term care and develop plan for long term care needs after daughter leaves in December    THN CM Short Term Goal #1 Start Date  10/26/15   Interventions for Short Term Goal #2   Utilizing teachback method, reviewed long term care options and encouraged them to have formal discussions and begin planning with more than one option      [facility]  [phone]

## 2015-10-26 NOTE — Patient Outreach (Signed)
[facility]) Care Management  10/26/2015  LADARRION TELFAIR 1920-11-28 [id number]   Self referral to Janalyn Shy, RN.  Thanks, Ronnell Freshwater. [place], [place] Assistant Phone: [phone] Fax: [phone]

## 2015-10-29 LAB — CULTURE, BLOOD (ROUTINE X 2)
CULTURE: NO GROWTH
Culture: NO GROWTH

## 2015-10-30 DIAGNOSIS — J449 Chronic obstructive pulmonary disease, unspecified: Secondary | ICD-10-CM | POA: Diagnosis not present

## 2015-10-30 DIAGNOSIS — I2510 Atherosclerotic heart disease of native coronary artery without angina pectoris: Secondary | ICD-10-CM | POA: Diagnosis not present

## 2015-10-30 DIAGNOSIS — J189 Pneumonia, unspecified organism: Secondary | ICD-10-CM | POA: Diagnosis not present

## 2015-10-30 DIAGNOSIS — Z23 Encounter for immunization: Secondary | ICD-10-CM | POA: Diagnosis not present

## 2015-10-30 DIAGNOSIS — J9611 Chronic respiratory failure with hypoxia: Secondary | ICD-10-CM | POA: Diagnosis not present

## 2015-11-07 ENCOUNTER — Other Ambulatory Visit (INDEPENDENT_AMBULATORY_CARE_PROVIDER_SITE_OTHER): Payer: Self-pay | Admitting: Internal Medicine

## 2015-11-20 ENCOUNTER — Other Ambulatory Visit: Payer: Self-pay | Admitting: *Deleted

## 2015-11-20 NOTE — Patient Outreach (Signed)
[facility]) Care Management   11/20/2015  Dustin Blankenship 1920-10-16 [id number]  Dustin Blankenship is an 81 y.o. male who presented to the ED last month with shortness of breath and hemoptysis. He was noted to have pneumonia in the emergency department and swallowing difficulty with concern for aspiration. He was also noted to have bleeding of the gums. Dustin Blankenship is on Plavix and had not stopped it prior to a dental procedure that was performed on the prior Monday. In the emergency department, he was started on IV antibiotics and given IV fluids. At some point, Dustin Blankenship became angry and insisted on discharge, against medical advice.   Dustin Blankenship self referred to [facility] at the request of his wife who is followed by [facility]. I am seeing Dustin Blankenship today at his home for a routine home visit for Case Management and Care Coordination. We have offered on numerous occasions to assist Mr. & Dustin Blankenship with transition to a higher level of care but he has declined. He and his wife did have a brief stay at a local ALF but left because Dustin Blankenship was unhappy.   Subjective: "I think I'm doing well. I don't need you to visit me anymore but I would like it if you check in with me every 3 months or so."  Objective:  BP 122/80 mmHg  Pulse 72  Ht 1.778 m (_0 )  Wt 195 lb (88.451 kg)  BMI 27.98 kg/m2  SpO2 90%  Review of Systems  Constitutional: Negative.   HENT: Negative.   Eyes: Positive for blurred vision.       Right eye discomfort related, per patient report, to glaucoma for which he is wearing an eye patch today  Respiratory: Negative.   Cardiovascular: Negative.   Gastrointestinal: Negative.   Genitourinary: Negative.   Musculoskeletal: Positive for myalgias.  Skin: Negative.   Neurological: Negative.   Psychiatric/Behavioral: Negative.     Physical Exam  Constitutional: He is oriented to person, place, and time. Vital signs are normal. He appears  well-developed and well-nourished. He is active.  Eyes:    Neck: No JVD present.  Cardiovascular: Normal rate and regular rhythm.   Respiratory: Effort normal and breath sounds normal.  GI: Soft. Bowel sounds are normal.  Neurological: He is alert and oriented to person, place, and time.  Skin: Skin is warm and dry.  Psychiatric: He has a normal mood and affect. His speech is normal and behavior is normal. Judgment and thought content normal. Cognition and memory are normal.    Current Medications:   Current Outpatient Prescriptions  Medication Sig Dispense Refill  . Acetaminophen (TYLENOL ARTHRITIS PAIN PO) Take 650 mg by mouth 4 (four) times daily.    Marland Kitchen allopurinol (ZYLOPRIM) 300 MG tablet Take 300 mg by mouth daily.    . clopidogrel (PLAVIX) 75 MG tablet Take 75 mg by mouth daily.     . COMBIGAN 0.2-0.5 % ophthalmic solution     . donepezil (ARICEPT) 10 MG tablet Take 1 tablet by mouth daily.    Marland Kitchen escitalopram (LEXAPRO) 10 MG tablet Take 10 mg by mouth at bedtime.     Marland Kitchen ipratropium (ATROVENT) 0.06 % nasal spray Place 2 sprays into both nostrils 2 (two) times daily.     Marland Kitchen Ketotifen Fumarate (THERA TEARS ALLERGY OP) Apply to eye. 3 drops in both eyes at least once a day per patient wife. Left eye.    Marland Kitchen levofloxacin (  LEVAQUIN) 500 MG tablet Take 500 mg by mouth daily.    Marland Kitchen levothyroxine (SYNTHROID, LEVOTHROID) 125 MCG tablet Take 125 mcg by mouth daily.    Marland Kitchen LORazepam (ATIVAN) 1 MG tablet TAKE ONE TABLET BY MOUTH TWICE DAILY AS NEEDED. 60 tablet 0  . nitroGLYCERIN (NITROSTAT) 0.4 MG SL tablet Place 0.4 mg under the tongue every 5 (five) minutes as needed. Chest Pain    . pilocarpine (PILOCAR) 1 % ophthalmic solution     . Polyethyl Glycol-Propyl Glycol (SYSTANE) 0.4-0.3 % GEL Apply 1-2 drops to eye. Per wife sometimes he may use 3 drops in the right eye.    . polyethylene glycol (MIRALAX / GLYCOLAX) packet Take 17 g by mouth daily as needed.     . saw palmetto 500 MG capsule Take 500  mg by mouth 2 (two) times daily.     . traZODone (DESYREL) 50 MG tablet      No current facility-administered medications for this visit.    Functional Status:   In your present state of health, do you have any difficulty performing the following activities: 10/24/2015  Hearing? Y  Vision? N  Difficulty concentrating or making decisions? N  Walking or climbing stairs? Y  Dressing or bathing? Y  Doing errands, shopping? N    Assessment:    Acute Health Condition (PNA) in patient with COPD - Dustin Blankenship was discharged from the hospital last month after an admission for PNA/hemoptysis. He has completed prescribed oral antibiotics. He said at the time of discharge that he "did not and do not now have pneumonia. They don't know what they're talking about..." He reports feeling well from a respiratory/pulmonary standpoint. He is using his home O2 @ 2l/[place] throughout each night and intermittently throughout the day as needed. Breath sounds are clear today.  Acute Health Condition (recent dental work with bleeding post procedure) - Dustin Blankenship had a dental procedure last month and did not stop his anticoagulant prior to the procedure and subsequently had bleeding for which he reported eventually to the emergency department. He has had no further bleeding and has a follow up appointment this Friday.   Level of Care Concerns - Dustin Blankenship lives in his home in [place] with his wife who has considerable health concerns of her own. Dustin Blankenship' daughter is visiting (but not staying in the home; staying in a large camper at a local [place]) from [name] and will be here through December. She is helping with transportation/doctor visits and some home and health management. However, when she leaves Mrs. Blankenship is concerned about her ability to provide needed care for Dustin Blankenship. They moved to an ALF last year but Dustin Blankenship was dissatisfied with the arrangement and facilities and signed the two of them out. They have  hired several professional in home care service providers but Mr. Zink has fired almost all of them.   Last month, we discussed the various options for long term care resources. Mr Lizotte and his family discussed the options privately and Mr. Mukai continues to decline any change in level of care.   Plan:    I will reach out to Mr. Rubert by phone in January.   Mr. Ledee will call for any signs and symptoms or concerns.   [facility] CM Care Plan Problem One        Most Recent Value   Care Plan Problem One  Health Maintenance Concers   Role Documenting the Problem One  Care Management Coordinator  Care Plan for Problem One  Active   THN Long Term Goal (31-90 days)  Over the next 90 days, patient will verbalize understanding of long term plan of care for health maintenance   THN Long Term Goal Start Date  11/20/15   [facility] Long Term Goal Met Date  11/20/15   Interventions for Problem One Long Term Goal  Established with patient mutually determined goals for ongoing health maintenance including quarterly telephonic assessment, adherence to provider appointments, and plan for urgent/emergent medical needs    THN CM Short Term Goal #1 (0-30 days)  Over the next 30 days patient will attend all scheduled provider appointments, taking blue [facility] Care Management notebook to make notes and present any questions for provider   [facility] CM Short Term Goal #1 Start Date  11/20/15   [facility] CM Short Term Goal #1 Met Date  11/20/15   Interventions for Short Term Goal #1  Reviewed appointment schedule with patient   THN CM Short Term Goal #2 (0-30 days)  Over the next 30 days, patient and/or spouse will verbalize understanding of signs and symptoms of worsening or new pulmonary symptoms   THN CM Short Term Goal #2 Start Date  10/26/15   [facility] CM Short Term Goal #2 Met Date  11/20/15   Interventions for Short Term Goal #2  Utilizing teachback method and [employer], revviewed signs and symptoms of worsening  pulmonary condition and when to call provider    [facility] CM Care Plan Problem Two        Most Recent Value   Care Plan Problem Two  Level of Care Concerns   Role Documenting the Problem Two  Care Management [place] for Problem Two  Not Active   Interventions for Problem Two Long Term Goal   Utilizing teachback method, discussed with patient and spouse options for long term care to include pvt duty in home care, ALF vs SNF care   [facility] Long Term Goal (31-90) days  Over the next 31 days, patient and family will verbalize understanding of options for long term care   THN Long Term Goal Start Date  10/26/15   THN Long Term Goal Met Date  11/20/15   THN CM Short Term Goal #1 (0-30 days)  Over the next 30 days, patient and family will discuss privately options for long term care and develop plan for long term care needs after daughter leaves in December    THN CM Short Term Goal #1 Start Date  10/26/15   [facility] CM Short Term Goal #1 Met Date   11/20/15   Interventions for Short Term Goal #2   Utilizing teachback method, reviewed long term care options and encouraged them to have formal discussions and begin planning with more than one option      [place] [facility]  [phone]

## 2015-12-12 ENCOUNTER — Other Ambulatory Visit (INDEPENDENT_AMBULATORY_CARE_PROVIDER_SITE_OTHER): Payer: Self-pay | Admitting: Internal Medicine

## 2015-12-15 ENCOUNTER — Encounter: Payer: Self-pay | Admitting: *Deleted

## 2015-12-15 ENCOUNTER — Other Ambulatory Visit: Payer: Self-pay | Admitting: *Deleted

## 2015-12-15 NOTE — Patient Outreach (Signed)
[facility]) Care Management  12/15/2015  Dustin Blankenship [birth year] [id number]   Dustin Blankenship is an 81 y.o. male who was briefly treated at [facility] for pneumonia. He also recently experienced hemoptysis and it was noted after a dental procedure that Dustin Blankenship had not stopped his Plavix as instructed, prior to the procedure. He returned to the dentist in the 2 weeks following his procedure and is recovered from this incident reporting no current symptoms or difficulties.   Dustin Blankenship self referred to [facility] at the request of his wife who is followed by [facility]. I've seen Dustin Blankenship 3 times over the last couple of months, face to face, for Case Management and Care Coordination. We have offered on numerous occasions to assist Mr. & Mrs. Blankenship with transition to a higher level of care but he has declined. He and his wife did have a brief stay at a local ALF but left because Dustin Blankenship was unhappy.  I spoke with Dustin Blankenship by phone today. Other than a request to be evaluated for HHPT to assist with chronic hip and back pain which is limiting his mobility, Dustin Blankenship denies any other health concerns. He says he has not experienced any respiratory symptoms and is otherwise doing well.   I reached out to Dr. [name]' office to request consideration of a home health PT referral.   Plan: I will follow up with Dustin Blankenship by phone next week. The goal is to get PT in place and possibly transition Dustin Blankenship to telephonic case management.    [place] [facility]  [phone]

## 2015-12-19 DIAGNOSIS — N401 Enlarged prostate with lower urinary tract symptoms: Secondary | ICD-10-CM | POA: Diagnosis not present

## 2015-12-19 DIAGNOSIS — J9611 Chronic respiratory failure with hypoxia: Secondary | ICD-10-CM | POA: Diagnosis not present

## 2015-12-19 DIAGNOSIS — I25119 Atherosclerotic heart disease of native coronary artery with unspecified angina pectoris: Secondary | ICD-10-CM | POA: Diagnosis not present

## 2015-12-27 ENCOUNTER — Other Ambulatory Visit: Payer: Self-pay | Admitting: *Deleted

## 2015-12-27 NOTE — Patient Outreach (Signed)
[facility]) Care Management  12/27/2015  Dustin Blankenship 30-Jun-1920 [id number]  I reached out to [name] today to follow up on COPD self health management with possible plans to transition to telephonic case management. [name] answered the phone and said that [name] was not feeling well enough to speak with me. She related that he had recently been started on a new medication to help with his dwindling appetite and with sleep disturbances. She could not tell me the name of the medication.   [name] would like to see me at home so I arranged for a face to face appointment on Friday, 12/29/15.   Plan: follow up with primary care after face to face visit.    [place] Management  [phone]

## 2015-12-29 ENCOUNTER — Other Ambulatory Visit: Payer: Self-pay | Admitting: *Deleted

## 2015-12-29 NOTE — Patient Outreach (Signed)
[facility]) Care Management   12/29/2015  LENOARD HELBERT 09/23/1920 [id number]  BAKARI NIKOLAI is an 82 y.o. male who self referred to [facility] at the request of his wife who is followed by [facility]. I've seen Mr. Wessell several times over the last couple of months, face to face, for Case Management and Care Coordination. We have offered on numerous occasions to assist Mr. & Mrs. Donley with transition to a higher level of care but he has declined. He and his wife did have a brief stay at a local ALF but left because Mr. Martine was unhappy.  Mr. & Mrs. Fan have paid, private duty in home caregivers who are present 12 hours/day. Mr. Dorner and his wife live in a townhome in [place]. They have ready transportation and no difficulty getting prescriptions or to and from provider appointments.    Subjective: "I only took one of those pills and it made me feel weak."   Objective:  BP 138/80 mmHg  Pulse 70  SpO2 94%  Review of Systems  Constitutional: Positive for malaise/fatigue.  HENT: Negative.        Reports headaches not inconsistent with baseline  Eyes: Negative.        No changes  Respiratory: Negative for cough, shortness of breath and wheezing.   Cardiovascular: Negative for chest pain.  Musculoskeletal: Negative for falls.  Neurological: Positive for weakness.       Patient remained seated during visit, rising only once to remove his sweater; unsteady with difficulty balancing, unusual for patient    Physical Exam  Constitutional: Vital signs are normal. He appears well-developed and well-nourished. He appears listless. He is cooperative. He appears ill. No distress.  Cardiovascular: Normal rate.   Respiratory: Effort normal.  Neurological: He appears listless.  Skin: Skin is warm and dry.  Psychiatric: Thought content normal. He is slowed and withdrawn. Cognition and memory are impaired. He expresses inappropriate judgment. He exhibits  abnormal recent memory.  Patient far less engaged, communicative, and active than baseline. Affect flat compared to baseline. Patient confused about timeline of recent events. Unable to accurately describe recent events surrounding new medication administration.     Current Medications:   Current Outpatient Prescriptions  Medication Sig Dispense Refill  . donepezil (ARICEPT) 10 MG tablet Take 1 tablet by mouth daily.    . mirtazapine (REMERON) 7.5 MG tablet Take 7.5 mg by mouth at bedtime.    . Acetaminophen (TYLENOL ARTHRITIS PAIN PO) Take 650 mg by mouth 4 (four) times daily.    Marland Kitchen allopurinol (ZYLOPRIM) 300 MG tablet Take 300 mg by mouth daily.    . clopidogrel (PLAVIX) 75 MG tablet Take 75 mg by mouth daily.     . COMBIGAN 0.2-0.5 % ophthalmic solution     . escitalopram (LEXAPRO) 10 MG tablet Take 10 mg by mouth at bedtime.     Marland Kitchen ipratropium (ATROVENT) 0.06 % nasal spray Place 2 sprays into both nostrils 2 (two) times daily.     Marland Kitchen Ketotifen Fumarate (THERA TEARS ALLERGY OP) Apply to eye. 3 drops in both eyes at least once a day per patient wife. Left eye.    Marland Kitchen levofloxacin (LEVAQUIN) 500 MG tablet Take 500 mg by mouth daily. Reported on 12/15/2015    . levothyroxine (SYNTHROID, LEVOTHROID) 125 MCG tablet Take 125 mcg by mouth daily.    Marland Kitchen LORazepam (ATIVAN) 1 MG tablet TAKE ONE TABLET BY MOUTH TWICE DAILY AS NEEDED. 60 tablet  0  . nitroGLYCERIN (NITROSTAT) 0.4 MG SL tablet Place 0.4 mg under the tongue every 5 (five) minutes as needed. Chest Pain    . pilocarpine (PILOCAR) 1 % ophthalmic solution     . Polyethyl Glycol-Propyl Glycol (SYSTANE) 0.4-0.3 % GEL Apply 1-2 drops to eye. Per wife sometimes he may use 3 drops in the right eye.    . polyethylene glycol (MIRALAX / GLYCOLAX) packet Take 17 g by mouth daily as needed.     . saw palmetto 500 MG capsule Take 500 mg by mouth 2 (two) times daily.     . traZODone (DESYREL) 50 MG tablet      No current facility-administered medications for  this visit.    Assessment:    Mr. Mccanless is a 82 year old gentleman who has been followed by [facility] for a few months for level of care concerns, medication management, and issues related to frailty.   I received a call from his wife who was concerned about an unexpected reaction to a newly prescribed medication. Mr. Vejar was prescribed Remeron 7.84m by Dr. HLuan Pullingin response to Mr. MDipaola report of insomnia and declining appetite. Mrs. MMollicastates that Mr. MKumarihad a good response to this new medication after his first dose but became somnolent, disoriented, and weak after 2nd and 3rd doses. They discontinued this medication.   Today, I find Mr. MFlorendoto appear weak and much less conversant and engaged than he usually is. He is alert but is confused about some of the details surrounding this new difficulty with medication. His balance is poor and he is generally weaker than his baseline.    Plan: I reached out to Dr. HLuan Pullingto provide an update on Mr. MSebocondition as outlined above and requested assistance with scheduling an appointment for Mr. MDoyle Askewto see Dr. HLuan Pullingprior to the March appointment previously scheduled.   I will reach out to Mr. Cali by phone next week.   THN CM Care Plan Problem One        Most Recent Value   Care Plan Problem One  Health Maintenance Concers   Role Documenting the Problem One  Care Management Coordinator   Care Plan for Problem One  Active   THN Long Term Goal (31-90 days)  Over the next 90 days, patient will verbalize understanding of long term plan of care for health maintenance   THN Long Term Goal Start Date  12/29/15 [not met,  re-established]   Interventions for Problem One Long Term Goal  Established with patient mutually determined goals for ongoing health maintenance including quarterly telephonic assessment, adherence to provider appointments, and plan for urgent/emergent medical needs    THN CM Short Term Goal #1 (0-30 days)   Over the next 30 days patient will attend all scheduled provider appointments, taking blue [facility] to make notes and present any questions for provider   [facility] Go[facility] #1 Start Date  12/29/15 [not met,  re-established]   Interventions for Short Term Goal #1  Reviewed appointment schedule with patient    [facility] Problem Two        Most Recent Value   Care Plan Problem Two  Medication Management Concerns   Role Documenting the Problem Two  Care Management CSalt Lickfor Problem Two  Active   Interventions for Problem Two Long Term Goal   Medications reviewed,  plan to discuss medicaiton management  with provider reviewed   THN Long Term Goal (31-90) days  Over the next 31 days, patient and family verbalize undertanding of medications and possible side effects   THN Long Term Goal Start Date  12/29/15   THN CM Short Term Goal #1 (0-30 days)  Over the next 7 days, patient/family will obtain provider appointment to review medications   THN CM Short Term Goal #1 Start Date  12/29/15   Interventions for Short Term Goal #2   Contacted provider office to update on condition and request provider appointment      [facility]  [phone]

## 2016-01-02 DIAGNOSIS — H1811 Bullous keratopathy, right eye: Secondary | ICD-10-CM | POA: Diagnosis not present

## 2016-01-03 DIAGNOSIS — H1811 Bullous keratopathy, right eye: Secondary | ICD-10-CM | POA: Diagnosis not present

## 2016-01-09 ENCOUNTER — Other Ambulatory Visit: Payer: Self-pay | Admitting: *Deleted

## 2016-01-09 NOTE — Patient Outreach (Signed)
[facility]) Care Management  01/09/2016  AVYUKTH BONTEMPO 22-Apr-1920 [id number]   Mr. Rueb is a 82 year old gentleman who has been followed by [facility] for a few months for level of care concerns, medication management, and issues related to frailty.   I received a call from his wife last who was concerned about an unexpected reaction to a newly prescribed medication. Mr. Dill was prescribed Remeron 7.42m by Dr. HLuan Pullingin response to Mr. MPalau report of insomnia and declining appetite. Mrs. MStemmstates that Mr. MWieckhad a good response to this new medication after his first dose but became somnolent, disoriented, and weak after 2nd and 3rd doses. They discontinued this medication.   When I visited Mr. MBlanklast week, he appeared weak and much less conversant and engaged than he usually is. He was alert but confused about some of the details surrounding this new difficulty with medication. His balance was poor and he was weaker than his baseline.   I reached out to Dr. HLuan Pullingto provide an update on Mr. MHepplercondition as outlined above and requested assistance with scheduling an appointment for Mr. MDoyle Askewto see Dr. HLuan Pullingprior to the March appointment previously scheduled. Today, Mrs. MWittreports that she hasn't heard from the office. So, I forwarded the request again.   Plan: Mrs. MFarrais to call Dr. HLuan Pulling office if she hasn't heard from them by Thursday.    [facility] WestManagement  [phone]

## 2016-01-11 DIAGNOSIS — I25119 Atherosclerotic heart disease of native coronary artery with unspecified angina pectoris: Secondary | ICD-10-CM | POA: Diagnosis not present

## 2016-01-11 DIAGNOSIS — R5383 Other fatigue: Secondary | ICD-10-CM | POA: Diagnosis not present

## 2016-01-11 DIAGNOSIS — J449 Chronic obstructive pulmonary disease, unspecified: Secondary | ICD-10-CM | POA: Diagnosis not present

## 2016-01-11 DIAGNOSIS — J9611 Chronic respiratory failure with hypoxia: Secondary | ICD-10-CM | POA: Diagnosis not present

## 2016-01-12 ENCOUNTER — Other Ambulatory Visit: Payer: Self-pay | Admitting: *Deleted

## 2016-01-12 NOTE — Patient Outreach (Signed)
[facility]) Care Management  01/12/2016  TENNIS MCKINNON September 12, 1920 [id number]   Call received from Mrs. Doyle Blankenship regarding follow up appointment with Dr. [name] for Dustin Blankenship. [name] was seen in the office and has a follow up appointment scheduled for 02/22/16 @ 10:30am.    [facility] Care Management  [phone]

## 2016-01-13 ENCOUNTER — Other Ambulatory Visit (INDEPENDENT_AMBULATORY_CARE_PROVIDER_SITE_OTHER): Payer: Self-pay | Admitting: Internal Medicine

## 2016-01-15 DIAGNOSIS — R5383 Other fatigue: Secondary | ICD-10-CM | POA: Diagnosis not present

## 2016-01-15 DIAGNOSIS — J449 Chronic obstructive pulmonary disease, unspecified: Secondary | ICD-10-CM | POA: Diagnosis not present

## 2016-01-15 DIAGNOSIS — I25119 Atherosclerotic heart disease of native coronary artery with unspecified angina pectoris: Secondary | ICD-10-CM | POA: Diagnosis not present

## 2016-01-15 DIAGNOSIS — J9611 Chronic respiratory failure with hypoxia: Secondary | ICD-10-CM | POA: Diagnosis not present

## 2016-01-16 ENCOUNTER — Telehealth (INDEPENDENT_AMBULATORY_CARE_PROVIDER_SITE_OTHER): Payer: Self-pay | Admitting: Internal Medicine

## 2016-01-16 ENCOUNTER — Other Ambulatory Visit (INDEPENDENT_AMBULATORY_CARE_PROVIDER_SITE_OTHER): Payer: Self-pay | Admitting: Internal Medicine

## 2016-01-16 NOTE — Telephone Encounter (Signed)
The Rx has been completed and the patient has been made aware.

## 2016-01-16 NOTE — Telephone Encounter (Signed)
Ruel Favors called on behalf of Mr. Alarid, (his care taker) saying he's completely out of Lorazepam. Please send a refill to his pharmacy. Please call [name] if necessary.  [facility] ph# [phone]  Thank you.

## 2016-01-30 ENCOUNTER — Ambulatory Visit (INDEPENDENT_AMBULATORY_CARE_PROVIDER_SITE_OTHER): Payer: Self-pay | Admitting: Internal Medicine

## 2016-02-11 IMAGING — CR DG CHEST 1V PORT
1 series · 1 of 1 positions shown · non-contrast
Comparison: Chest radiograph May 14, 2011

CLINICAL DATA: Cough, aspiration after tooth extraction today.
History of aspiration. History of CHF, COPD.

EXAM:
PORTABLE CHEST 1 VIEW

[ap portable]
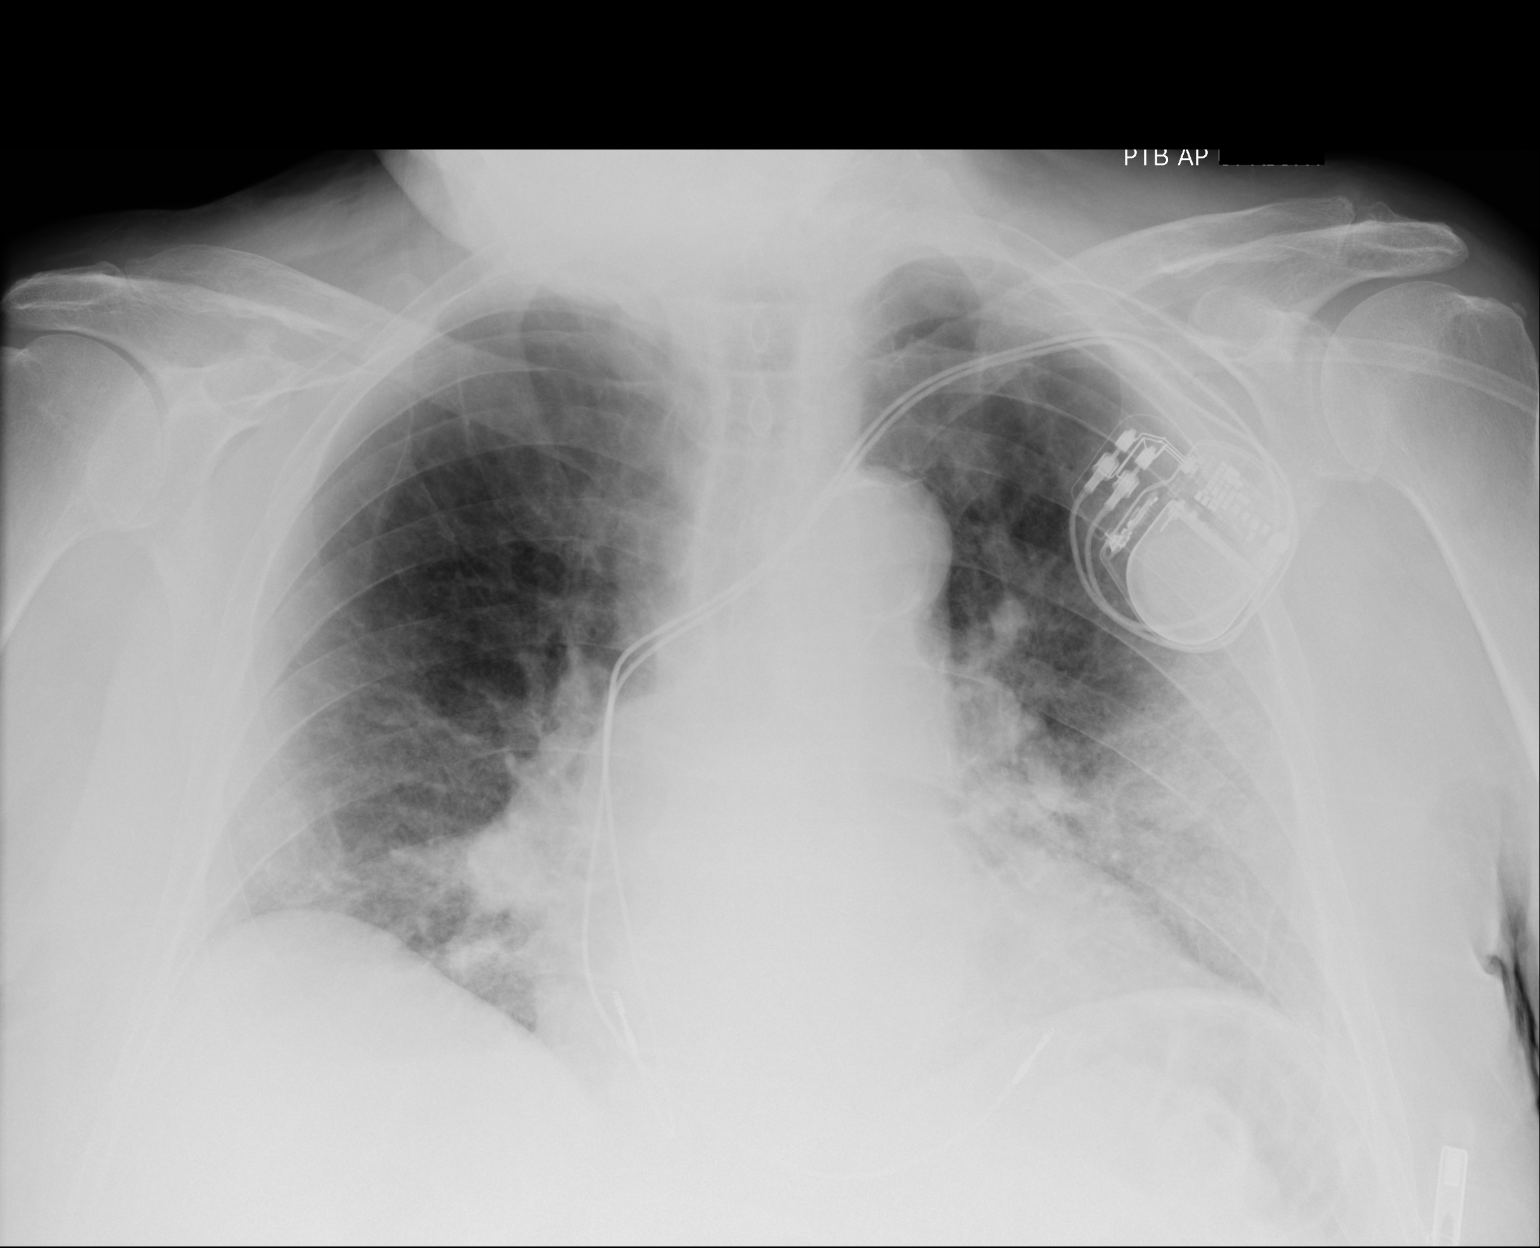

[1 of 1 positions shown; findings below may reference images not displayed]

FINDINGS: Cardiac silhouette is mildly enlarged unchanged. Calcified aortic
knob. Patchy LEFT midlung zone and airspace opacities. Fullness of
bilateral pulmonary hila and pulmonary vascular congestion. No
pleural effusion. No pneumothorax. Dual lead LEFT cardiac pacemaker
in situ. Loop monitor projects in LEFT chest. Soft tissue planes and
included osseous structure are nonsuspicious. Tiny calcification
projecting at LEFT humeral head can be seen with calcific
tendinopathy.
IMPRESSION: LEFT mid and lower lung zone airspace opacities concerning for
pneumonia.

Mild cardiomegaly and pulmonary vascular congestion. Fullness of the
hila may represent prominent vessels or, lymphadenopathy.

## 2016-02-15 ENCOUNTER — Ambulatory Visit (INDEPENDENT_AMBULATORY_CARE_PROVIDER_SITE_OTHER): Payer: Medicare Other | Admitting: Internal Medicine

## 2016-02-15 ENCOUNTER — Encounter (INDEPENDENT_AMBULATORY_CARE_PROVIDER_SITE_OTHER): Payer: Self-pay | Admitting: Internal Medicine

## 2016-02-15 VITALS — BP 130/90 | HR 65 | Temp 97.9°F | Resp 18 | Ht 67.7 in | Wt 203.5 lb

## 2016-02-15 DIAGNOSIS — R1310 Dysphagia, unspecified: Secondary | ICD-10-CM

## 2016-02-15 DIAGNOSIS — K224 Dyskinesia of esophagus: Secondary | ICD-10-CM | POA: Diagnosis not present

## 2016-02-15 DIAGNOSIS — R1314 Dysphagia, pharyngoesophageal phase: Secondary | ICD-10-CM | POA: Diagnosis not present

## 2016-02-15 DIAGNOSIS — R1319 Other dysphagia: Secondary | ICD-10-CM

## 2016-02-15 NOTE — Patient Instructions (Signed)
Call if swallowing difficulty gets worse.

## 2016-02-16 NOTE — Progress Notes (Signed)
Presenting complaint;  Follow-up for dysphagia.  Subjective:  Patient is 82 year old Caucasian male who is here for scheduled visit. He was last seen on 07/18/2015. He has gained he pounds since then. He states he has adjusted to his swallowing difficulty. He does not need dry meats or beef. He has no probable liquids or soft foods. He denies heartburn regurgitation but he does have intermittent coughing after eating. He has not been treated for pneumonia recently. He is using Atrovent 2-3 times a day as needed. He complains of insomnia. He is also complaining about inability to achieve erection. He is taking herbal medication which is not listed. He is convinced that his testosterone level is no and he wants something done. He denies abdominal pain. His bowels move regularly as long as he takes MiraLAX.   Current Medications: Outpatient Encounter Prescriptions as of 02/15/2016  Medication Sig  . Acetaminophen (TYLENOL ARTHRITIS PAIN PO) Take 650 mg by mouth 4 (four) times daily.  Marland Kitchen allopurinol (ZYLOPRIM) 300 MG tablet Take 300 mg by mouth daily.  . COMBIGAN 0.2-0.5 % ophthalmic solution 1 drop every 12 (twelve) hours. Left Eye  . donepezil (ARICEPT) 10 MG tablet Take 1 tablet by mouth daily.  Marland Kitchen escitalopram (LEXAPRO) 10 MG tablet Take 10 mg by mouth at bedtime.   Marland Kitchen ipratropium (ATROVENT) 0.06 % nasal spray Place 2 sprays into both nostrils 2 (two) times daily.   Marland Kitchen Ketotifen Fumarate (THERA TEARS ALLERGY OP) Apply to eye. 3 drops in both eyes at least once a day per patient wife. Left eye.  . levothyroxine (SYNTHROID, LEVOTHROID) 125 MCG tablet Take 125 mcg by mouth daily.  Marland Kitchen LORazepam (ATIVAN) 1 MG tablet TAKE ONE TABLET BY MOUTH TWICE DAILY AS NEEDED.  . mirtazapine (REMERON) 7.5 MG tablet Take 7.5 mg by mouth at bedtime.  . nitroGLYCERIN (NITROSTAT) 0.4 MG SL tablet Place 0.4 mg under the tongue every 5 (five) minutes as needed. Chest Pain  . pilocarpine (PILOCAR) 1 % ophthalmic solution  Place 1 drop into both eyes 3 (three) times daily.   [name] Glycol-Propyl Glycol (SYSTANE) 0.4-0.3 % GEL Apply 1-2 drops to eye. Per wife sometimes he may use 3 drops in the right eye.  . polyethylene glycol (MIRALAX / GLYCOLAX) packet Take 17 g by mouth daily as needed.   . saw palmetto 500 MG capsule Take 500 mg by mouth 2 (two) times daily.   . traZODone (DESYREL) 50 MG tablet Take 50 mg by mouth 2 (two) times daily.   . [DISCONTINUED] clopidogrel (PLAVIX) 75 MG tablet Take 75 mg by mouth daily. Reported on 02/15/2016  . [DISCONTINUED] levofloxacin (LEVAQUIN) 500 MG tablet Take 500 mg by mouth daily. Reported on 02/15/2016   No facility-administered encounter medications on file as of 02/15/2016.     Objective: Blood pressure 130/90, pulse 65, temperature 97.9 F (36.6 C), temperature source Oral, resp. rate 18, height 5' 7.7" (1.72 m), weight 203 lb 8 oz (92.307 kg). Patient is alert and does not appear to be in acute distress. No eye contact possible since he has dark glasses on. Oropharyngeal mucosa is normal. No neck masses or thyromegaly noted. Cardiac exam with regular rhythm normal S1 and S2. No murmur or gallop noted. Lungs are clear to auscultation. Abdomen is full but soft and nontender without organomegaly or masses. No LE edema or clubbing noted. He has partial gloves in both hands not covering the palm and fingers.     Assessment:  #1. Dysphagia secondary to  esophageal motility disorder. He seemed to have adjusted to this reality. He is staying away from meats. He is having very little problem with soft foods or liquids. He is maintaining his weight. #2. Erectile dysfunction. He appears to be obsessed that he is not able to formed sexual activity. He does not understand that treatment may lead to various side effects and he will have to bring it up with Dr. [name]. #3. Insomnia. He is on two medications not including lorazepam which is for esophageal motility disorder  Again patient advised to discuss this symptom with Dr. [name]  Plan:  Continue lorazepam at 1 mg by mouth twice a day which is primarily for esophageal dysmotility. Patient will call of dysphagia worsens. Patient advised to discuss ED issue with Dr. [name] on his next appointment. Office visit in 6 months.

## 2016-02-21 ENCOUNTER — Other Ambulatory Visit: Payer: Self-pay | Admitting: *Deleted

## 2016-02-21 NOTE — Patient Outreach (Signed)
[facility]) Care Management  02/21/2016  JACQUELINE DELAPENA [birth year] [id number]  Unsuccessful telephone outreach to Mr. Harvill today. He has expressed in the past that he does not want telephonic case management services. I will try to reach him once again this week and if I am unsuccessful will send a letter of interest. If Mr. Phifer is no longer interested in [facility] CM services, I will discharge.    [facility] Management  [phone]

## 2016-02-22 DIAGNOSIS — H40001 Preglaucoma, unspecified, right eye: Secondary | ICD-10-CM | POA: Diagnosis not present

## 2016-02-22 DIAGNOSIS — N401 Enlarged prostate with lower urinary tract symptoms: Secondary | ICD-10-CM | POA: Diagnosis not present

## 2016-02-22 DIAGNOSIS — I25119 Atherosclerotic heart disease of native coronary artery with unspecified angina pectoris: Secondary | ICD-10-CM | POA: Diagnosis not present

## 2016-02-23 ENCOUNTER — Encounter: Payer: Self-pay | Admitting: *Deleted

## 2016-02-23 ENCOUNTER — Other Ambulatory Visit: Payer: Self-pay | Admitting: *Deleted

## 2016-02-23 DIAGNOSIS — N390 Urinary tract infection, site not specified: Secondary | ICD-10-CM | POA: Diagnosis not present

## 2016-02-23 NOTE — Patient Outreach (Signed)
[facility]) Care Management  02/23/2016  MILEY LINDON 1920-12-08 [id number]  I spoke with Mrs. Grenz today by phone. Mr. Vento is resting. Over the last 2 months, I have discussed with the Vo long term plans for self health management of chronic disease. Mr. & Mrs. Lata have agreed that Mr. Barlowe is no longer in need of routine home visits for nursing case management services. Mrs. & Mrs. Cochrane have declined transition to telephonic case management or health coaching through [facility], choosing rather to call if they are in need of services at any time in the future. Mr. & Mrs. Brigance have my contact information and the 24 hour nurse line.   I will close Mr. Sokolov' active nursing case management case.    [facility] Management  [phone]

## 2016-03-08 ENCOUNTER — Encounter: Payer: Self-pay | Admitting: Internal Medicine

## 2016-03-08 DIAGNOSIS — H18413 Arcus senilis, bilateral: Secondary | ICD-10-CM | POA: Diagnosis not present

## 2016-03-08 DIAGNOSIS — H409 Unspecified glaucoma: Secondary | ICD-10-CM | POA: Diagnosis not present

## 2016-03-08 DIAGNOSIS — Z961 Presence of intraocular lens: Secondary | ICD-10-CM | POA: Diagnosis not present

## 2016-03-08 DIAGNOSIS — H04123 Dry eye syndrome of bilateral lacrimal glands: Secondary | ICD-10-CM | POA: Diagnosis not present

## 2016-03-14 ENCOUNTER — Other Ambulatory Visit: Payer: Self-pay

## 2016-03-14 ENCOUNTER — Encounter (INDEPENDENT_AMBULATORY_CARE_PROVIDER_SITE_OTHER): Payer: Medicare Other | Admitting: Internal Medicine

## 2016-03-14 DIAGNOSIS — I4891 Unspecified atrial fibrillation: Secondary | ICD-10-CM | POA: Diagnosis not present

## 2016-04-18 ENCOUNTER — Encounter: Payer: Self-pay | Admitting: Internal Medicine

## 2016-04-18 ENCOUNTER — Other Ambulatory Visit (INDEPENDENT_AMBULATORY_CARE_PROVIDER_SITE_OTHER): Payer: Self-pay | Admitting: Internal Medicine

## 2016-04-18 ENCOUNTER — Ambulatory Visit (INDEPENDENT_AMBULATORY_CARE_PROVIDER_SITE_OTHER): Payer: Medicare Other | Admitting: Internal Medicine

## 2016-04-18 VITALS — BP 98/67 | HR 76 | Ht 70.0 in | Wt 205.0 lb

## 2016-04-18 DIAGNOSIS — I495 Sick sinus syndrome: Secondary | ICD-10-CM | POA: Diagnosis not present

## 2016-04-18 LAB — CUP PACEART INCLINIC DEVICE CHECK
Battery Voltage: 2.89 V
Brady Statistic RA Percent Paced: 67 %
Brady Statistic RV Percent Paced: 6 %
Implantable Lead Implant Date: 20111006
Implantable Lead Location: 753859
Implantable Lead Location: 753860
Lead Channel Impedance Value: 412.5 Ohm
Lead Channel Impedance Value: 575 Ohm
Lead Channel Pacing Threshold Amplitude: 0.75 V
Lead Channel Sensing Intrinsic Amplitude: 12 mV
Lead Channel Sensing Intrinsic Amplitude: 3.1 mV
MDC IDC LEAD IMPLANT DT: 20111006
MDC IDC MSMT BATTERY REMAINING LONGEVITY: 62.4
MDC IDC MSMT LEADCHNL RA PACING THRESHOLD PULSEWIDTH: 0.8 ms
MDC IDC MSMT LEADCHNL RV PACING THRESHOLD AMPLITUDE: 0.625 V
MDC IDC MSMT LEADCHNL RV PACING THRESHOLD PULSEWIDTH: 0.5 ms
MDC IDC SESS DTM: 20170504164409
MDC IDC SET LEADCHNL RA PACING AMPLITUDE: 2 V
MDC IDC SET LEADCHNL RV PACING AMPLITUDE: 0.875
MDC IDC SET LEADCHNL RV PACING PULSEWIDTH: 0.5 ms
MDC IDC SET LEADCHNL RV SENSING SENSITIVITY: 2 mV
Pulse Gen Model: 2210
Pulse Gen Serial Number: 7183444

## 2016-04-18 NOTE — Patient Instructions (Signed)
Your physician wants you to follow-up in: 1 Year with Dr. Lovena Le. You will receive a reminder letter in the mail two months in advance. If you don't receive a letter, please call our office to schedule the follow-up appointment.  Your physician recommends that you schedule a follow-up appointment in: 6 months with the [facility] physician recommends that you continue on your current medications as directed. Please refer to the Current Medication list given to you today.  If you need a refill on your cardiac medications before your next appointment, please call your pharmacy.  Thank you for choosing [facility]!

## 2016-04-18 NOTE — Progress Notes (Signed)
HPI [name] returns today for followup. He has been followed by Dr. Sallyanne Kuster in the past. He is a 82 yo man with symptomatic bradycardia due to sinus node dysfunction, CAD, and is s/p PPM insertion. He is referred to me today for ongoing followup of his PPM.  He denies chest pain or sob. No edema. He is limited by his vision. He has had glaucoma. Allergies  Allergen Reactions  . Morphine And Related   . Multaq [Dronedarone]   . Penicillins Other (See Comments)    Unknown. Patient received zosyn this am at 0415(10/24/15) without any problems  . Latex Rash  . Sulfonamide Derivatives Rash  . Testosterone Rash    The cream form causes a rash when rubbed on skin.      Current Outpatient Prescriptions  Medication Sig Dispense Refill  . Acetaminophen (TYLENOL ARTHRITIS PAIN PO) Take 650 mg by mouth 4 (four) times daily.    Marland Kitchen allopurinol (ZYLOPRIM) 300 MG tablet Take 300 mg by mouth daily.    . COMBIGAN 0.2-0.5 % ophthalmic solution 1 drop every 12 (twelve) hours. Left Eye    . donepezil (ARICEPT) 10 MG tablet Take 1 tablet by mouth daily.    Marland Kitchen escitalopram (LEXAPRO) 10 MG tablet Take 10 mg by mouth at bedtime.     Marland Kitchen ipratropium (ATROVENT) 0.06 % nasal spray Place 2 sprays into both nostrils 2 (two) times daily.     Marland Kitchen Ketotifen Fumarate (THERA TEARS ALLERGY OP) Apply to eye. 3 drops in both eyes at least once a day per patient wife. Left eye.    . levothyroxine (SYNTHROID, LEVOTHROID) 125 MCG tablet Take 125 mcg by mouth daily.    Marland Kitchen LORazepam (ATIVAN) 1 MG tablet TAKE ONE TABLET BY MOUTH TWICE DAILY AS NEEDED. 60 tablet 2  . mirtazapine (REMERON) 7.5 MG tablet Take 7.5 mg by mouth at bedtime.    . nitroGLYCERIN (NITROSTAT) 0.4 MG SL tablet Place 0.4 mg under the tongue every 5 (five) minutes as needed. Chest Pain    . pilocarpine (PILOCAR) 1 % ophthalmic solution Place 1 drop into both eyes 3 (three) times daily.     [name] Glycol-Propyl Glycol (SYSTANE) 0.4-0.3 % GEL Apply 1-2  drops to eye. Per wife sometimes he may use 3 drops in the right eye.    . polyethylene glycol (MIRALAX / GLYCOLAX) packet Take 17 g by mouth daily as needed.     . saw palmetto 500 MG capsule Take 500 mg by mouth 2 (two) times daily.     . traZODone (DESYREL) 50 MG tablet Take 50 mg by mouth 2 (two) times daily.      No current facility-administered medications for this visit.     Past Medical History  Diagnosis Date  . GERD (gastroesophageal reflux disease)   . COPD (chronic obstructive pulmonary disease) ([place])   . Glaucoma   . Hypothyroid   . Pacemaker 09/20/2010     ST Jude Accent DR RF device  MODEL #PN2210,SERIAL #[id number]  . Glaucoma   . Pulmonary HTN (HCC)     DUE TO CHRONIC HYPOXIA   FROM COPD  . CHF (congestive heart failure) (HCC)     right heart failure and pulm htn due to chronic hypoxia from COPD  . Atrial fib/flutter, transient     not on warfarin due to risk of falls ,on aspirin for prophylaxis  . Sinus node dysfunction ([place])     Dual-chamber permanent pacermaker ST JUDE  .  Gout     ROS:   All systems reviewed and negative except as noted in the HPI.   Past Surgical History  Procedure Laterality Date  . Doppler echocardiography  07/25/2011    EF =50-55%  . Nm myocar perf wall motion  07/25/2011  . Insert / replace / remove pacemaker  09/20/2010    ST JUDE ACCENT-dual chamber ;    . Lung biopsy    . Colonoscopy w/ polypectomy    . Esophagogastroduodenoscopy N/A 02/24/2015    Procedure: ESOPHAGOGASTRODUODENOSCOPY (EGD);  Surgeon: [name], MD;  [facility];  Service: Endoscopy;  Laterality: N/A;  10:00  . Esophageal dilation N/A 02/24/2015    Procedure: ESOPHAGEAL DILATION;  Surgeon: [name], MD;  Location: [facility];  Service: Endoscopy;  Laterality: N/A;     No family history on file.   Social History   Social History  . Marital Status: Married    Spouse Name: Michiel Cowboy  . Number of Children: 3  . Years of Education:  College   Occupational History  . Retired    Social History Main Topics  . Smoking status: Former Smoker -- 3.00 packs/day for 18 years    Types: Cigarettes    Start date: 02/17/1952    Quit date: 12/16/1953  . Smokeless tobacco: Never Used  . Alcohol Use: No  . Drug Use: No  . Sexual Activity: Not on file   Other Topics Concern  . Not on file   Social History Narrative   Patient lives at home with spouse currently.   Caffeine Use: 1 cup of coffee in a.m and black tea in the afternoon     BP 98/67 mmHg  Pulse 76  Ht _0  (1.778 m)  Wt 205 lb (92.987 kg)  BMI 29.41 kg/m2  Physical Exam:  Well appearing elderly man, NAD HEENT: Unremarkable Neck:  No JVD, no thyromegally Lymphatics:  No adenopathy Back:  No CVA tenderness Lungs:  Clear with no wheezes HEART:  Regular rate rhythm, no murmurs, no rubs, no clicks Abd:  soft, positive bowel sounds, no organomegally, no rebound, no guarding Ext:  2 plus pulses, no edema, no cyanosis, no clubbing Skin:  No rashes no nodules Neuro:  CN II through XII intact, motor grossly intact   DEVICE  Normal device function.  See PaceArt for details.   Assess/Plan: 1. Sinus node dysfunction - he is stable 2. PPM - his St. Jude device is working normally. Will recheck in several months 3. SVT - he is asymptomatic.   Mikle Bosworth.D.

## 2016-04-19 ENCOUNTER — Telehealth (INDEPENDENT_AMBULATORY_CARE_PROVIDER_SITE_OTHER): Payer: Self-pay | Admitting: Internal Medicine

## 2016-04-19 DIAGNOSIS — H1811 Bullous keratopathy, right eye: Secondary | ICD-10-CM | POA: Diagnosis not present

## 2016-04-19 NOTE — Telephone Encounter (Signed)
Prescription has been called to [facility]. We will need to fax the hard copy to them on Monday. Called the patient but was unable to get him on his phone.

## 2016-04-19 NOTE — Telephone Encounter (Signed)
Patient's caregiver called and stated that the patient needs a refill on his Lorazepam.  She stated that he's completely out.  [phone]

## 2016-06-04 DIAGNOSIS — H40001 Preglaucoma, unspecified, right eye: Secondary | ICD-10-CM | POA: Diagnosis not present

## 2016-06-04 DIAGNOSIS — J449 Chronic obstructive pulmonary disease, unspecified: Secondary | ICD-10-CM | POA: Diagnosis not present

## 2016-06-04 DIAGNOSIS — N401 Enlarged prostate with lower urinary tract symptoms: Secondary | ICD-10-CM | POA: Diagnosis not present

## 2016-06-04 DIAGNOSIS — I25119 Atherosclerotic heart disease of native coronary artery with unspecified angina pectoris: Secondary | ICD-10-CM | POA: Diagnosis not present

## 2016-06-11 DIAGNOSIS — H401122 Primary open-angle glaucoma, left eye, moderate stage: Secondary | ICD-10-CM | POA: Diagnosis not present

## 2016-07-23 ENCOUNTER — Other Ambulatory Visit: Payer: Self-pay | Admitting: *Deleted

## 2016-07-23 DIAGNOSIS — R531 Weakness: Secondary | ICD-10-CM | POA: Diagnosis not present

## 2016-07-23 DIAGNOSIS — R404 Transient alteration of awareness: Secondary | ICD-10-CM | POA: Diagnosis not present

## 2016-07-23 NOTE — Patient Outreach (Signed)
[facility]) Care Management  07/23/2016  [name] [birth year] [id number]  Referral from Nurse Call Center.   Telephone call to 2 listed numbers; recording stated wireless customer is not available, please call later; unable to leave messages.  Plan: Will follow up.  Sherrin Daisy, RN BSN [facility] Management Coordinator [facility] Care Management  [phone]

## 2016-07-24 ENCOUNTER — Other Ambulatory Visit: Payer: Self-pay | Admitting: *Deleted

## 2016-07-24 NOTE — Patient Outreach (Signed)
[facility]) Care Management  07/24/2016  DIANNA DESHLER 09/30/1920 [id number]   Nurse Line Referral: : Caregiver called in for advice.   Telephone follow up call to caregiver-Carolyn who was able to give HIPPA verification.  States she is one of   patient's private caregivers. States that she is not with patient today. States patient has had symptoms of chest pain and low blood pressure in the past 2 days. States she has called MD office and 911 in past 2 days. States patient has declined to go to MD office and refused to go to hospital after EMS checked him and recommended that he be transported to hospital.   Attempted to call patient at 2 different contact numbers; recordings stated that wireless customer was not available. Unable to leave message.   Plan: Will follow up  Sherrin Daisy, RN BSN [place] Management Coordinator [facility] Care Management  [phone]

## 2016-07-25 ENCOUNTER — Encounter: Payer: Self-pay | Admitting: *Deleted

## 2016-07-25 ENCOUNTER — Other Ambulatory Visit: Payer: Self-pay | Admitting: *Deleted

## 2016-07-25 NOTE — Patient Outreach (Signed)
[facility]) Care Management  07/25/2016  Dustin Blankenship May 10, 1920 [id number]  Telephone call attempt; unable to leave message.  Plan: Send outreach letter to patient. Follow up in 10 business days.  Sherrin Daisy, RN BSN [place] Management Coordinator [facility] Care Management  [phone]

## 2016-07-29 ENCOUNTER — Encounter: Payer: Self-pay | Admitting: *Deleted

## 2016-07-29 NOTE — Telephone Encounter (Signed)
  This encounter was created in error - please disregard.

## 2016-07-30 ENCOUNTER — Other Ambulatory Visit: Payer: Self-pay | Admitting: *Deleted

## 2016-07-30 NOTE — Patient Outreach (Signed)
[facility]) Care Management  07/30/2016  Dustin Blankenship 05-Feb-1920 [id number]   Telephone call to patient; recording states person not available; unable to leave message. Telephone call to alternate number: spouse answered call and advised that patient not available; better to call back later in week. Left contact information for call back.  Plan: Will follow up.  Sherrin Daisy, RN BSN [place] Management Coordinator [facility] Care Management  [phone]

## 2016-07-31 ENCOUNTER — Encounter: Payer: Self-pay | Admitting: *Deleted

## 2016-07-31 NOTE — Telephone Encounter (Signed)
  This encounter was created in error - please disregard.

## 2016-07-31 NOTE — Patient Outreach (Signed)
[facility]) Care Management  07/31/2016  Dustin Blankenship 04/26/1920 [id number]   Spoke with

## 2016-08-01 ENCOUNTER — Encounter: Payer: Self-pay | Admitting: *Deleted

## 2016-08-01 ENCOUNTER — Other Ambulatory Visit: Payer: Self-pay | Admitting: *Deleted

## 2016-08-01 DIAGNOSIS — J449 Chronic obstructive pulmonary disease, unspecified: Secondary | ICD-10-CM

## 2016-08-01 DIAGNOSIS — M179 Osteoarthritis of knee, unspecified: Secondary | ICD-10-CM

## 2016-08-01 DIAGNOSIS — M1710 Unilateral primary osteoarthritis, unspecified knee: Secondary | ICD-10-CM

## 2016-08-01 NOTE — Patient Outreach (Signed)
[facility]) Care Management  08/01/2016  Dustin Blankenship 1920-10-27 [id number]   Referral received from Dr. [name] office to resume [place] Case Management services. I spoke with Mrs Linford and earlier with [name] primary paid caregiver (with permission) regarding some recent changes [name] has made to his medication regimen without the knowledge of any of his providers. [name] has ordered "male supplements" via the internet and since starting to take them has experienced periods of hypotension and chest pain. His wife and caregivers have encouraged him to go to the hospital when he had these episodes and he declined each time. One time 911 was called and the EMT's also recommended a trip to the emergency department which [name] declined. [name] and the caregiver also note that [name] had diarrhea last week and they felt he had become dehydrated based on weakness and blood pressure readings in the 80's/50's. Over the counter hydration supplements (Gatorade and Pedialyte) were started by the spouse and caregiver with, according to [name], some improvement.   Mr. & Mrs. Orantes agreed to have me come to the home tomorrow for a face to face visit.   Plan: Home Visit Friday 08/02/16.   [place] Management  [phone]

## 2016-08-01 NOTE — Patient Outreach (Addendum)
[place]) Care Management  08/01/2016  Dustin Blankenship 1920/04/08 [id number]  Telephone call to patient who was advised of reason for call & of [facility] care management services.  HIPPA verification received from patient.  Patient states major health concerns are arthritis & osteoarthritis which require that he use cane or walker to get around; trouble with breathing that requires using oxygen at night or when he needs its. Patient admits that he has COPD. When questioned,  patient did not know COPD action plan.  Patient voices that he has pacemaker in place but cannot voice reason it was placed. States if he has chest pain he uses pill to help relieve pain. States no pain today. Voices that he has scheduled appointment with heart specialist coming up soon.   Patient voices that he has lost vision in one eye due to glaucoma and has hearing lost & wears bilateral hearing aids.   Patient states he & spouse get assistance from nursing assistant that works various hours throughout the week . States caregiver takes them to MD appointments. Also states she fixes him medication box. States he is taking medications as ordered by his MD.    Assessment:  Lack of knowledge regarding chronic diseases & management of conditions. (COPD, Osteoarthritis, Heart condition(has pacemaker), GERD Falls risk-using cane or walker to ambulate Taking over 15 medications Sensory deficit-hearing & vision.   Patient given recommendation for [facility] care management services. Patient agrees to community CM services; declines telephonic follow up .  Plan:  Send to care management assistant to assign to [facility] care coordinator for health assessments, falls assessment; disease & , medication management. Patient with vision & hearing deficit. Patient prefers Community CM services and has consented.  Please refer to Pharmacist for over 15 medications.   Sherrin Daisy, RN BSN [place] Management Coordinator [facility]  Care Management  [phone]

## 2016-08-02 ENCOUNTER — Encounter: Payer: Self-pay | Admitting: *Deleted

## 2016-08-02 ENCOUNTER — Other Ambulatory Visit: Payer: Self-pay | Admitting: *Deleted

## 2016-08-02 NOTE — Patient Outreach (Signed)
[facility]) Care Management   08/02/2016  Dustin Blankenship Jun 18, 1920 [id number]  Dustin Blankenship is an 82 y.o. male with history of COPD, osteoarthritis, UTIs, afib with implanted [employer] dual chamber RF device implanted in October 2011, coronary artery disease, and dysphagia.   Dustin Blankenship has been followed in the community in the past and most recently by the telephonic case management team at [facility]. Dustin Blankenship was referred to the community team again this week for concerns related to medication non-adherence, new symptoms, and knowledge deficits related to his plan of care.   Dustin Blankenship lives at home in [place] with his wife Dustin Blankenship and has 24/7 private duty paid caregivers. His primary caregiver who is very knowledge about Dustin Blankenship needs is Dustin Blankenship [phone]. Dustin Blankenship has given permission to discuss his healthcare needs with Dustin Blankenship. Dustin Blankenship has a daughter who lives in [name] and visits a few times a year. She keeps in touch by phone and is supportive.   Subjective: "I've had quite a few problems lately - it's hard to swallow, sleeping is one of my problems, I have a pacemaker and I've been having pain in my ribs, ."   Objective:  BP 122/72   Pulse 73   SpO2 95%   Review of Systems  Constitutional: Positive for malaise/fatigue.  HENT: Positive for hearing loss.   Eyes: Negative.   Respiratory: Negative.   Cardiovascular: Positive for chest pain. Negative for orthopnea and leg swelling.  Gastrointestinal: Positive for diarrhea.  Genitourinary: Negative.   Musculoskeletal: Positive for myalgias. Negative for falls.  Skin: Negative.   Neurological: Negative.   Psychiatric/Behavioral: Positive for memory loss.    Physical Exam  Constitutional: He is oriented to person, place, and time. Vital signs are normal. He appears well-developed and well-nourished. He is active.  Non-toxic appearance. He has a sickly appearance. He does not  appear ill.  Cardiovascular: Normal rate, regular rhythm and normal heart sounds.   Respiratory: Effort normal and breath sounds normal. He has no wheezes. He has no rhonchi. He has no rales.  GI: Soft. Bowel sounds are normal.  Neurological: He is oriented to person, place, and time.  Skin: Skin is warm, dry and intact.  Psychiatric: He has a normal mood and affect. His behavior is normal. His speech is delayed. He exhibits abnormal recent memory.    Encounter Medications:   Outpatient Encounter Prescriptions as of 08/02/2016  Medication Sig Note  . Acetaminophen (TYLENOL ARTHRITIS PAIN PO) Take 650 mg by mouth 4 (four) times daily. 02/15/2016: Patient takes on an as needed basis.  Marland Kitchen allopurinol (ZYLOPRIM) 300 MG tablet Take 300 mg by mouth daily.   . COMBIGAN 0.2-0.5 % ophthalmic solution 1 drop every 12 (twelve) hours. Left Eye 10/23/2015: Received from: External Pharmacy  . donepezil (ARICEPT) 10 MG tablet Take 1 tablet by mouth daily. 03/14/2014: Received from: External Pharmacy Received Sig:   . escitalopram (LEXAPRO) 10 MG tablet Take 10 mg by mouth at bedtime.  03/13/2015: Received from: External Pharmacy  . ipratropium (ATROVENT) 0.06 % nasal spray Place 2 sprays into both nostrils 2 (two) times daily.  07/18/2015: Patient states that he uses up to 4 times daily.  Marland Kitchen Ketotifen Fumarate (THERA TEARS ALLERGY OP) Apply to eye. 3 drops in both eyes at least once a day per patient wife. Left eye.   . levothyroxine (SYNTHROID, LEVOTHROID) 125 MCG tablet Take 125 mcg by mouth daily.   Marland Kitchen  LORazepam (ATIVAN) 1 MG tablet TAKE ONE TABLET BY MOUTH TWICE DAILY AS NEEDED.   . mirtazapine (REMERON) 7.5 MG tablet Take 7.5 mg by mouth at bedtime.   . nitroGLYCERIN (NITROSTAT) 0.4 MG SL tablet Place 0.4 mg under the tongue every 5 (five) minutes as needed. Chest Pain 12/15/2015: On hand; new bottle obtained December  . pilocarpine (PILOCAR) 1 % ophthalmic solution Place 1 drop into both eyes 3 (three) times  daily.  10/23/2015: Received from: External Pharmacy  . Polyethyl Glycol-Propyl Glycol (SYSTANE) 0.4-0.3 % GEL Apply 1-2 drops to eye. Per wife sometimes he may use 3 drops in the right eye. 02/15/2016: PRN   . polyethylene glycol (MIRALAX / GLYCOLAX) packet Take 17 g by mouth daily as needed.  07/18/2015: Patient states that he takes 1/2 cap when he needs it.  . saw palmetto 500 MG capsule Take 500 mg by mouth 2 (two) times daily.    . traZODone (DESYREL) 50 MG tablet Take 50 mg by mouth 2 (two) times daily.  10/23/2015: Received from: External Pharmacy   No facility-administered encounter medications on file as of 08/02/2016.     Functional Status:   In your present state of health, do you have any difficulty performing the following activities: 12/15/2015 10/24/2015  Hearing? [name]  Vision? N N  Difficulty concentrating or making decisions? Y N  Walking or climbing stairs? Y Y  Dressing or bathing? Y Y  Doing errands, shopping? Y N  Preparing Food and eating ? Y -  Using the Toilet? Y -  In the past six months, have you accidently leaked urine? Y -  Do you have problems with loss of bowel control? N -  Managing your Medications? Y -  Managing your Finances? Y -  Housekeeping or managing your Housekeeping? Y -  Some recent data might be hidden    Fall/Depression Screening:    PHQ 2/9 Scores 08/01/2016 12/15/2015  PHQ - 2 Score 0 0    Assessment:  82 year old gentleman living in [place] with his wife. Dustin Blankenship Blankenship has paid in home caregivers 24/7. He has dementia and recently began having side effects related to use of over the counter/unprescribed supplements.   Medication Non-Adherence - Dustin Blankenship has been ordered "male enhancement" supplements called "T Boost" via the internet; his wife was not aware until recently that he was taking over the counter/unprescribed supplements until Dustin Blankenship began having chest pain and taking nitroglycerin and having subsequent hypotension, nausea, and  diarrhea; he stopped taking the supplement after > 1 week of her requesting that he stop the supplement but has since ordered a different supplement, "Vioten Plus"; Dustin Blankenship has had several episodes of chest pain and hypotension but has refused on at least 3 separate occasions to report to the ED for help; 911 was called once and he was evaluated by EMS but again refused to go to the ED  I notified his primary care provider, Dr. Sinda Du and scheduled today's face to face visit.   I have reviewed Dustin Blankenship prescribed medications with him and his wife and primary caregiver Dustin Blankenship. I have advised Dustin Blankenship. Atchley to take ONLY medications that are prescribed to him by his doctors.   A [facility] Care Management pharmacy referral has been made and I have provided information about Dustin Blankenship. Slaymaker current health concerns to the Chartered certified accountant.   Home Safety/Fall Risk - Dustin Blankenship. Schetter was high risk for fall prior to his use of over  the counter medications; he ambulates with a walker or wheelchair and requires assistance from his caregivers to move about the home and perform adl's/iadl's; I reinforced to him today the importance of medication adherence in reducing his fall risk; we discussed fall risk reduction strategies and home safety concerns at length   Knowledge Deficit related to patient report of new symptoms - Dustin Blankenship. Gassert says he believes he has "an enlarged prostate"; he says these symptoms started after he began taking the over the counter supplements; he wishes to see a urologist; I have notified Dr. [name] of this request   Plan:   Dustin Blankenship. Schiffman will take ONLY medications prescribed to him by his doctors.   Dustin Blankenship. Mercer' caregivers will continue to check and record Dustin Blankenship. Solis blood pressure daily and will report blood pressure < 993 systolic, bradycardia w/ heart rate < 60, chest pain, shortness of breath, dizziness/lightheadedness, or any other new or worsened symptom.   I will collaborate with our  pharmacist for ongoing medication management/adherence/education needs.   I will call or see Dustin Blankenship. Wolk weekly x the next month to follow up on medication management/adherence needs and home safety/fall risk reduction strategies.    [facility] CM Care Plan Problem One   Flowsheet Row Most Recent Value  Care Plan Problem One  Medication Non-Adherence  Role Documenting the Problem One  Care Management Coordinator  Care Plan for Problem One  Active  THN Long Term Goal (31-90 days)  Over the next 60 days, patient will adhere to prescribed medication regimen as evidenced by medication review and patient/spouse/caregiver report  THN Long Term Goal Start Date  08/02/16  Interventions for Problem One Long Term Goal  Utilizing discussion and teachback method, reviewed prescribed medications and prescribing instructions with patient,  pharmacy referral pending  THN CM Short Term Goal #1 (0-30 days)  Over the next 30 days, patient and/or spouse/caregiver will verbalize understanding of prescribed medication regimen as evidenced by report of same   [facility] CM Short Term Goal #1 Start Date  08/02/16  Interventions for Short Term Goal #1  Medications reviewed with patient/spouse/caregiver,  new printed list of prescribed medications provided and reviewed  THN CM Short Term Goal #2 (0-30 days)  Over the next 30 days, patient will take only medications prescribed by providers, as evidenced by patient/spouse/caregiver report  [facility] CM Short Term Goal #2 Start Date  08/02/16  Interventions for Short Term Goal #2  Discussed importance of adherence to medication regimen and to only taking medications prescribed by provider, checking with provider prior to addition of any over the counter medications  THN CM Short Term Goal #3 (0-30 days)  Over the next 30 days, patient/spouse/caregiver will report any new or worsened symptoms including chest pain, changes in blood pressure, or changes in general health condition  THN CM Short  Term Goal #3 Start Date  08/02/16  Interventions for Short Tern Goal #3  Utilizing teachback method, reviewed importance of reporting any/all symptoms to provider, emphasizing that medication side effects may present as symptoms of abnormal health conditions    [facility] CM Care Plan Problem Two   Flowsheet Row Most Recent Value  Care Plan Problem Two  Home Safety/Fall Risk  Role Documenting the Problem Two  Care Management [place] for Problem Two  Active  Interventions for Problem Two Long Term Goal   Utilizing teachback method, reviewed with patient/spouse/caregiver importance of institution of home safety/fall risk reducation strategies  THN Long Term Goal (31-90)  days  Over the next 60 days, patient/spouse/caregiver will verbalize understanding of safety and fall risk reduction strategies  THN Long Term Goal Start Date  08/02/16  THN CM Short Term Goal #1 (0-30 days)  Over the next 30 days patient will take only medicaitons prescribed to him by his providers  [facility] CM Short Term Goal #1 Start Date  08/02/16  Interventions for Short Term Goal #2   Medication review completed (see care plan problem 1),  pharmacy referral     [facility] CM Care Plan Problem Three   Flowsheet Row Most Recent Value  Care Plan Problem Three  Knowledge Deficit related to new urologic symptoms  Role Documenting the Problem Three  Care Management Coordinator  Care Plan for Problem Three  Active  THN Long Term Goal (31-90) days  Over the next 31 days, patient/spouse/caregiver will verbalize plan of care for treatment of new patient reported symptoms  THN Long Term Goal Start Date  08/02/16  Interventions for Problem Three Long Term Goal  Reviewed patient report of symptoms with primary care provider,  advised patient to take only medications  prescribed to him      [place] Management  [phone]

## 2016-08-03 ENCOUNTER — Encounter: Payer: Self-pay | Admitting: *Deleted

## 2016-08-06 ENCOUNTER — Other Ambulatory Visit: Payer: Self-pay | Admitting: *Deleted

## 2016-08-06 NOTE — Patient Outreach (Signed)
[facility]) Care Management  08/06/2016  CALLAWAY HAILES 1920/10/03 [id number]   I reached out to [facility] to follow up on new patient referral from Dr. [name]. Mr. Lofaro has an appointment at [facility] in [place] on Tuesday, September 17 2016 @ 1:30am. An alternate (and sooner) date is available - Friday, September 13, 2016 @ 10:30am. However, Mr. Meisenheimer requested a Tuesday/Thursday appointment as his caregiver [name] is available during those times to drive Mr. Spychalski to his appointments.   I called [name] @ [phone] to provide the information as outlined above. She will review with Mr. Talton and follow up with me if he needs further assistance with this matter.   Plan: I will follow up with Mr. Gearhart by phone next week.    [place] Management  [phone]

## 2016-08-15 ENCOUNTER — Encounter: Payer: Self-pay | Admitting: *Deleted

## 2016-08-15 ENCOUNTER — Other Ambulatory Visit: Payer: Self-pay | Admitting: *Deleted

## 2016-08-15 NOTE — Patient Outreach (Signed)
[facility]) Care Management  08/15/2016  YUSSUF SAWYERS Aug 13, 1920 [id number]   I spoke with Mrs. Fabela today who spoke on behalf of Mr. Malkin as he is feeling poorly because of a "dry socket" from a recent root canal performed earlier this week when his crown came off at home. Mr. Majeed went to the dentist this morning and had the "dry socket" packed. He is to return to the dentist in a week. He has had tylenol since returning home this morning for the pain.   Home Safety/Fall Risk - Mr. Paskett has had no falls in the last week. He uses his rolling walker for all ambulation. He is assisted by paid caregivers with morning care and bathing. His caregiver [name] drives him to all appointments and stays with him for the duration of the appointment, returning him to home and helping him getting settled in.   Medication Mangagement - Mr. Carriere has only been taking prescribed medications and no over the counter or internet ordered medications. He has an appointment with the urologist in October to discuss his questions related to BPH and urinary concerns. He says today his symptoms are not worse or better.   Plan: I will follow up with Mr. Harold next week again by phone, given his high risk for fall, dementia related concerns, and medication management needs.    [place] Management  [phone]

## 2016-08-20 ENCOUNTER — Encounter (INDEPENDENT_AMBULATORY_CARE_PROVIDER_SITE_OTHER): Payer: Self-pay | Admitting: Internal Medicine

## 2016-08-20 ENCOUNTER — Ambulatory Visit (INDEPENDENT_AMBULATORY_CARE_PROVIDER_SITE_OTHER): Payer: Medicare Other | Admitting: Internal Medicine

## 2016-08-20 VITALS — BP 146/72 | Temp 97.6°F | Wt 207.2 lb

## 2016-08-20 DIAGNOSIS — R1310 Dysphagia, unspecified: Secondary | ICD-10-CM

## 2016-08-20 DIAGNOSIS — K224 Dyskinesia of esophagus: Secondary | ICD-10-CM | POA: Diagnosis not present

## 2016-08-20 DIAGNOSIS — R1314 Dysphagia, pharyngoesophageal phase: Secondary | ICD-10-CM

## 2016-08-20 DIAGNOSIS — R1319 Other dysphagia: Secondary | ICD-10-CM

## 2016-08-20 MED ORDER — LORAZEPAM 1 MG PO TABS: 1.0000 mg | ORAL_TABLET | Freq: Two times a day (BID) | ORAL | 2 refills | Status: DC

## 2016-08-20 NOTE — Progress Notes (Signed)
Presenting complaint;  Follow-up for dysphagia.  Subjective:  Patient is 82 year old Caucasian male who has chronic dysphagia due to esophageal motility disorder and has been on lorazepam and is here for scheduled visit accompanied by his CNA. His wife could not accompany him today. Patient states his swallowing is about the same. He states he knows which foods to avoid. He states he has more difficulty with solids than with liquids. He has not had any episode of food impaction. He is not having any drowsiness with this medication. He says his bowels move with help. He is not taking polyethylene glycol daily. He is taking his wife's Xanax to rest at night. He wonders if I could write him a prescription. I told him I would defer this to Dr. [name]. He is already on a benzodiazepine.  Current Medications: Outpatient Encounter Prescriptions as of 08/20/2016  Medication Sig  . Acetaminophen (TYLENOL ARTHRITIS PAIN PO) Take 650 mg by mouth 4 (four) times daily.  Marland Kitchen allopurinol (ZYLOPRIM) 300 MG tablet Take 300 mg by mouth daily.  Marland Kitchen ALPRAZolam (XANAX) 0.25 MG tablet Take 0.25 mg by mouth at bedtime as needed for anxiety.  . COMBIGAN 0.2-0.5 % ophthalmic solution 1 drop every 12 (twelve) hours. Left Eye  . escitalopram (LEXAPRO) 10 MG tablet Take 10 mg by mouth at bedtime.   Marland Kitchen ipratropium (ATROVENT) 0.06 % nasal spray Place 2 sprays into both nostrils 2 (two) times daily.   Marland Kitchen Ketotifen Fumarate (THERA TEARS ALLERGY OP) Apply to eye. 3 drops in both eyes at least once a day per patient wife. Left eye.  . levothyroxine (SYNTHROID, LEVOTHROID) 125 MCG tablet Take 125 mcg by mouth daily.  Marland Kitchen LORazepam (ATIVAN) 1 MG tablet TAKE ONE TABLET BY MOUTH TWICE DAILY AS NEEDED.  Marland Kitchen nitroGLYCERIN (NITROSTAT) 0.4 MG SL tablet Place 0.4 mg under the tongue every 5 (five) minutes as needed. Chest Pain  . pilocarpine (PILOCAR) 1 % ophthalmic solution Place 1 drop into both eyes 3 (three) times daily.   [name]  Glycol-Propyl Glycol (SYSTANE) 0.4-0.3 % GEL Apply 1-2 drops to eye. Per wife sometimes he may use 3 drops in the right eye.  . polyethylene glycol (MIRALAX / GLYCOLAX) packet Take 17 g by mouth daily as needed.   . saw palmetto 500 MG capsule Take 500 mg by mouth 2 (two) times daily.   . [DISCONTINUED] mirtazapine (REMERON) 7.5 MG tablet Take 7.5 mg by mouth at bedtime.  . [DISCONTINUED] traZODone (DESYREL) 50 MG tablet Take 50 mg by mouth 2 (two) times daily.   . [DISCONTINUED] donepezil (ARICEPT) 10 MG tablet Take 1 tablet by mouth daily.   No facility-administered encounter medications on file as of 08/20/2016.      Objective: Blood pressure (!) 146/72, temperature 97.6 F (36.4 C), temperature source Oral, weight 207 lb 3.2 oz (94 kg). Patient appears to be comfortable in chair. He is using chair/walker to ambulate. Conjunctiva is pink. Sclera is nonicteric Right cornea is hazy. Oropharyngeal mucosa is normal. No neck masses or thyromegaly noted. Cardiac exam with regular rhythm normal S1 and S2. No murmur or gallop noted. Lungs are clear to auscultation. Abdomen is full but soft and nontender without organomegaly or masses. Abdomen was examined while patient was sitting. No LE edema or clubbing noted.   Assessment:  #1. Esophageal dysphagia secondary to esophageal motility disorder. He has been on lorazepam for several months which has helped. Dose has been reduced accounting for his age. He is tolerating current dose  well.   Plan:  New prescription given for lorazepam 1 mg by mouth twice a day for 1 month with 2 refills. Dose may have to be reduced if patient noted to develop drowsiness after taking lorazepam. Office visit in 6 months.

## 2016-08-20 NOTE — Patient Instructions (Addendum)
Please notify if lorazepam makes patient drowsy. Take MiraLAX or polyethylene on schedule rather than when necessary but can titrate dose.

## 2016-08-29 ENCOUNTER — Other Ambulatory Visit: Payer: Self-pay | Admitting: *Deleted

## 2016-08-29 ENCOUNTER — Encounter: Payer: Self-pay | Admitting: *Deleted

## 2016-08-29 ENCOUNTER — Other Ambulatory Visit: Payer: Self-pay | Admitting: Pharmacist

## 2016-08-29 NOTE — Progress Notes (Signed)
   This encounter was created in error - please disregard.

## 2016-08-29 NOTE — Patient Outreach (Signed)
[facility]) Care Management   08/29/2016  Dustin Blankenship Feb 18, 1920 [id number]  Dustin Blankenship is an 82 y.o. male history of COPD, osteoarthritis, UTIs, afib with implanted St. Jude dual chamber RF device implanted in October 2011, coronary artery disease, and dysphagia.   Dustin Blankenship has been followed in the community in the past and most recently by the telephonic case management team at [facility]. Dustin Blankenship was referred to the community team for concerns related to medication non-adherence, new symptoms, and knowledge deficits related to his plan of care.   Dustin Blankenship lives at home in [place] with his wife Michiel Cowboy and has 24/7 private duty paid caregivers. His primary caregiver who is very knowledge about Dustin Blankenship needs is [name] [phone]. Dustin Blankenship has given permission to discuss his healthcare needs with Ms. Charissa Blankenship. Dustin Blankenship has a daughter who lives in [name] and visits a few times a year. She keeps in touch by phone and is supportive.   Subjective: "I'm not taking that medicine any more so my chest hasn't hurt but I still need to work with the urologist on the problem."  Objective:  BP 112/74   Pulse 74   SpO2 98%   Review of Systems  HENT: Negative.   Eyes: Negative.   Respiratory: Negative.   Cardiovascular: Negative for chest pain, palpitations and leg swelling.  Gastrointestinal: Negative.   Genitourinary: Negative.   Musculoskeletal: Positive for myalgias. Negative for falls.  Skin:       Skin lesion noted on lateral left lower leg; dry, scaly, slightly reddened at the edges  Neurological: Positive for weakness.  Psychiatric/Behavioral: Positive for memory loss. The patient has insomnia.        Per wife and caregivers, sleeping during the day, awake at night  All other systems reviewed and are negative.   Physical Exam  Constitutional: He is oriented to person, place, and time. Vital signs are normal. He appears  well-developed and well-nourished. He appears lethargic.  Non-toxic appearance. He does not have a sickly appearance. He does not appear ill.  Cardiovascular: Normal rate, regular rhythm, S1 normal and S2 normal.   Respiratory: Effort normal and breath sounds normal. He has no wheezes. He has no rhonchi. He has no rales.  GI: Soft. Bowel sounds are normal.  Neurological: He is oriented to person, place, and time. He appears lethargic.  Skin: Skin is warm and dry. Lesion noted.  Psychiatric: He has a normal mood and affect. His speech is normal and behavior is normal. Cognition and memory are impaired. He expresses inappropriate judgment. He exhibits abnormal recent memory.    Encounter Medications:   Outpatient Encounter Prescriptions as of 08/29/2016  Medication Sig Note  . Acetaminophen (TYLENOL ARTHRITIS PAIN PO) Take 650 mg by mouth 4 (four) times daily. 02/15/2016: Patient takes on an as needed basis.  Marland Kitchen allopurinol (ZYLOPRIM) 300 MG tablet Take 300 mg by mouth daily.   Marland Kitchen ALPRAZolam (XANAX) 0.25 MG tablet Take 0.25 mg by mouth at bedtime as needed for anxiety.   . COMBIGAN 0.2-0.5 % ophthalmic solution 1 drop every 12 (twelve) hours. Left Eye 10/23/2015: Received from: External Pharmacy  . escitalopram (LEXAPRO) 10 MG tablet Take 10 mg by mouth at bedtime.  03/13/2015: Received from: External Pharmacy  . ipratropium (ATROVENT) 0.06 % nasal spray Place 2 sprays into both nostrils 2 (two) times daily.  07/18/2015: Patient states that he uses up to 4 times daily.  Marland Kitchen Ketotifen  Fumarate (THERA TEARS ALLERGY OP) Apply to eye. 3 drops in both eyes at least once a day per patient wife. Left eye.   . levothyroxine (SYNTHROID, LEVOTHROID) 125 MCG tablet Take 125 mcg by mouth daily.   Marland Kitchen LORazepam (ATIVAN) 1 MG tablet Take 1 tablet (1 mg total) by mouth 2 (two) times daily.   . nitroGLYCERIN (NITROSTAT) 0.4 MG SL tablet Place 0.4 mg under the tongue every 5 (five) minutes as needed. Chest Pain 12/15/2015: On  hand; new bottle obtained December  . pilocarpine (PILOCAR) 1 % ophthalmic solution Place 1 drop into both eyes 3 (three) times daily.  10/23/2015: Received from: External Pharmacy  . Polyethyl Glycol-Propyl Glycol (SYSTANE) 0.4-0.3 % GEL Apply 1-2 drops to eye. Per wife sometimes he may use 3 drops in the right eye. 02/15/2016: PRN   . polyethylene glycol (MIRALAX / GLYCOLAX) packet Take 17 g by mouth daily as needed.  07/18/2015: Patient states that he takes 1/2 cap when he needs it.  . saw palmetto 500 MG capsule Take 500 mg by mouth 2 (two) times daily.     Assessment:  82 year old gentleman living in [place] with his wife. [name] has paid in home caregivers 24/7. He has dementia and recently began having side effects related to use of over the counter/unprescribed supplements.   Medication Non-Adherence - Over the last few months, Dustin Blankenship ordered "male enhancement" supplements called "T Boost" via the internet; his wife was not aware until recently that he was taking over the counter/unprescribed supplements until Dustin Blankenship began having chest pain and taking nitroglycerin and having subsequent hypotension, nausea, and diarrhea; he stopped taking the supplement after > 1 week of her requesting that he stop the supplement but has since ordered a different supplement, "Vioten Plus"; [name] had several episodes of chest pain and hypotension but has refused on at least 3 separate occasions to report to the ED for help; 911 was called once and he was evaluated by EMS but again refused to go to the ED  I visited Mr. Hair at home on 8/18 and reviewed Dustin Blankenship prescribed medications with him and his wife and primary caregiver [name]. I have advised Dustin Blankenship to take ONLY medications that are prescribed to him by his doctors. I also referred Dustin Blankenship to our pharmacy team. I am making a collaborative visit with our pharmacist [name] PharmD today.    Dustin Blankenship denies any further episodes  of chest pain or hypotension (checked by his CNA).   Home Safety/Fall Risk - Dustin Blankenship was high risk for fall prior to his use of over the counter medications; he ambulates with a walker or wheelchair and requires assistance from his caregivers to move about the home and perform adl's/iadl's; I have discussed and reinforced with him and his wife and caregiver the importance of medication adherence in reducing his fall risk; we discussed fall risk reduction strategies at length. See note below re: use of Xanax for insomnia.   Cognitive Decline - Mr. Carias admits himself that his memory is worsening. [name] and [name] (primary caregiver) report that Mr. Dolin is sleeping during the day and having difficulty sleeping during the evening and night. He has been taking his wife's Xanax occasionally at night to help him rest.   Knowledge Deficit related to patient report of new symptoms - Mr. Smallman says he believes he has "an enlarged prostate"; he says these symptoms started after he began taking  the over the counter supplements; he has an appointment with the urologist in October  Chronic Health Condition (Dysphagia) - Mr. Lakey recently saw his gastroenterologist and received a confirmed diagnosis of esophageal dysphagia secondary to esophageal motility disorder. He was given a prescription for refill on Lorazepam 29m by mouth twice daily x 1 month with 2 refills. Dr. RLaural Goldennoted that this dose can be decreased if Mr. MLeserdevelops drowsiness. He is to be seen in the office again for follow up in 6 months.   We reviewed swallowing precautions today and I provided printed materials on dysphagia and swallowing precautions.   Plan:   Mr. MTrostlewill take ONLY medications prescribed to him by his doctors.   Mr. MChagnon caregivers will continue to check and record Mr. MWinokurblood pressure daily and will report blood pressure < 1701systolic, bradycardia w/ heart rate < 60, chest pain, shortness of  breath, dizziness/lightheadedness, or any other new or worsened symptom.   I will collaborate with our pharmacist for ongoing medication management/adherence/education needs.   I will call or see Mr. MMainwaringweekly x the next month to follow up on medication management/adherence needs and home safety/fall risk reduction strategies.    [facility]  [phone]

## 2016-08-29 NOTE — Patient Outreach (Signed)
Dustin Blankenship) Care Management  Dustin Blankenship   08/29/2016  Dustin Blankenship March 14, 1920 [id number]  Subjective: 82 year old white male referred to me for medication management due to him taking multiple over the counter supplements for "male enhancement" that were causing frequent chest pain and hypotension per his home CNA. Patient was seen today with Dustin Blankenship community nurse Dustin Blankenship in home visit. Today his CNA that fills his pill box and administers his medications states that he is no longer taking the "male enhancement" supplements and has been adherent to his medications.  She reports he has not been taking donepezil for several months due to stating "he doesn't need it."  His wife reports she gives him some of her Xanax 12.5 mg as needed for sleep as he has previously been taking it in the past.  He has been using excess eye drops and running out of refills due to his hands being shaky.  He refuses to allow his home CNA to assist with administering the eye drops.  Patient denies side effects with his medications.  He states  Dr Dustin Blankenship is his Opthamologist.  Eye Drops: Pilocarpine filled 07/19/16.  Combigan filled 08/06/16.  Both were filled for a 50 day supple but patient has been out of both for ~1 week.  Objective:   Encounter Medications: Outpatient Encounter Prescriptions as of 08/29/2016  Medication Sig Note  . Acetaminophen (TYLENOL ARTHRITIS PAIN PO) Take 650 mg by mouth 4 (four) times daily. 02/15/2016: Patient takes on an as needed basis.  Marland Kitchen allopurinol (ZYLOPRIM) 300 MG tablet Take 300 mg by mouth daily.   Marland Kitchen ALPRAZolam (XANAX) 0.25 MG tablet Take 0.25 mg by mouth at bedtime as needed for anxiety.   . COMBIGAN 0.2-0.5 % ophthalmic solution 1 drop every 12 (twelve) hours. Left Eye 10/23/2015: Received from: External Pharmacy  . escitalopram (LEXAPRO) 10 MG tablet Take 10 mg by mouth at bedtime.  03/13/2015: Received from: External Pharmacy  . ipratropium (ATROVENT) 0.06 %  nasal spray Place 2 sprays into both nostrils 2 (two) times daily.  07/18/2015: Patient states that he uses up to 4 times daily.  Marland Kitchen Ketotifen Fumarate (THERA TEARS ALLERGY OP) Apply to eye. 3 drops in both eyes at least once a day per patient wife. Left eye.   . levothyroxine (SYNTHROID, LEVOTHROID) 125 MCG tablet Take 125 mcg by mouth daily.   Marland Kitchen LORazepam (ATIVAN) 1 MG tablet Take 1 tablet (1 mg total) by mouth 2 (two) times daily.   . nitroGLYCERIN (NITROSTAT) 0.4 MG SL tablet Place 0.4 mg under the tongue every 5 (five) minutes as needed. Chest Pain 12/15/2015: On hand; new bottle obtained December  . pilocarpine (PILOCAR) 1 % ophthalmic solution Place 1 drop into both eyes 3 (three) times daily.  10/23/2015: Received from: External Pharmacy  . Polyethyl Glycol-Propyl Glycol (SYSTANE) 0.4-0.3 % GEL Apply 1-2 drops to eye. Per wife sometimes he may use 3 drops in the right eye. 02/15/2016: PRN   . polyethylene glycol (MIRALAX / GLYCOLAX) packet Take 17 g by mouth daily as needed.  07/18/2015: Patient states that he takes 1/2 cap when he needs it.  . saw palmetto 500 MG capsule Take 500 mg by mouth 2 (two) times daily.     No facility-administered encounter medications on file as of 08/29/2016.     Functional Status: In your present state of health, do you have any difficulty performing the following activities: 08/02/2016 12/15/2015  Hearing? [name]  Vision?  N N  Difficulty concentrating or making decisions? [name]  Walking or climbing stairs? Y Y  Dressing or bathing? Y Y  Doing errands, shopping? [name]  Preparing Food and eating ? Y Y  Using the Toilet? Y Y  In the past six months, have you accidently leaked urine? Y Y  Do you have problems with loss of bowel control? N N  Managing your Medications? Y Y  Managing your Finances? [name]  Housekeeping or managing your Housekeeping? [name]  Some recent data might be hidden    Fall/Depression Screening: PHQ 2/9 Scores 08/02/2016 08/01/2016 12/15/2015  PHQ - 2  Score 2 0 0  PHQ- 9 Score 6 - -    Assessment:  Drugs sorted by system:  Neurologic/Psychologic: lorazepam, alprazolam (wifes prescription), lexapro  Cardiovascular: nitroglycerin  Pulmonary/Allergy: ipratropium nasal spray  Gastrointestinal:docusate, miralax  Endocrine: levothyroxine  Pain: Acetaminophen  Vitamins/Minerals: Saw Palmetto  Miscellaneous: allopurinol, Thera Tears over the counter eye drops, Combigan eye drops, Systane eye drops, Pilocarpine eye drops  Duplications in therapy: Lorazepam, alprazolam Gaps in therapy: Atrial fibrillation not on anticoagulant or aspirin due to history of bleeding Medications to avoid in the elderly: benzodiazepines Other issues noted: running out of Eye drops early due to overuse because of "shaky hands"  Plan: -Patient has not been using over the counter medications or supplements which he has not been prescribed. -Discussed with patient and wife the risk of falls and other side effects when using multiple benzodiazepines and discouraged sharing each others prescriptions -Discussed with patient to allow his home CNA to administer eye drops to reduce waste but he refused.  -Will contact patients opthamlogist to see if samples of Combigan or Pilocarpine are available -Will notify patients primary care Dr [name] of patient utilizing multiple benzodiazepines -Pharmacy will sign off.  Please notify Dustin Blankenship if further assistance is needed.   [name], PharmD Dustin Blankenship PGY2 Pharmacy Resident [phone]

## 2016-08-30 ENCOUNTER — Encounter: Payer: Self-pay | Admitting: Pharmacist

## 2016-09-02 ENCOUNTER — Other Ambulatory Visit: Payer: Self-pay | Admitting: Pharmacist

## 2016-09-02 NOTE — Patient Outreach (Signed)
[facility]) Care Management  09/02/2016  Dustin Blankenship August 25, 1920 [id number]  Called patient and spoke with patients caregiver [name] to notify that I have contacted his opthalmologist to write prescription for Combigan and pilocarpine eye drops specifying a 30 day supply instead of a 50 days supply due to patient wasting eyedrops due to shaky hands. Instructed caregiver that prescription should be ready in the next couple days.  Caregiver demonstrated understanding.  [name], PharmD [facility] PGY2 Pharmacy Resident [phone]

## 2016-09-04 DIAGNOSIS — I25119 Atherosclerotic heart disease of native coronary artery with unspecified angina pectoris: Secondary | ICD-10-CM | POA: Diagnosis not present

## 2016-09-04 DIAGNOSIS — R1310 Dysphagia, unspecified: Secondary | ICD-10-CM | POA: Diagnosis not present

## 2016-09-04 DIAGNOSIS — J449 Chronic obstructive pulmonary disease, unspecified: Secondary | ICD-10-CM | POA: Diagnosis not present

## 2016-09-04 DIAGNOSIS — I10 Essential (primary) hypertension: Secondary | ICD-10-CM | POA: Diagnosis not present

## 2016-09-04 DIAGNOSIS — L02419 Cutaneous abscess of limb, unspecified: Secondary | ICD-10-CM | POA: Diagnosis not present

## 2016-09-05 DIAGNOSIS — H18462 Peripheral corneal degeneration, left eye: Secondary | ICD-10-CM | POA: Diagnosis not present

## 2016-09-06 ENCOUNTER — Other Ambulatory Visit: Payer: Self-pay | Admitting: *Deleted

## 2016-09-06 ENCOUNTER — Encounter: Payer: Self-pay | Admitting: *Deleted

## 2016-09-06 NOTE — Patient Outreach (Signed)
Dustin Blankenship) Care Management   09/06/2016  Dustin Blankenship 1920-08-01 [id number]  Dustin Blankenship is an 82 y.o. male with history of COPD, osteoarthritis, UTIs, afib with implanted St. Jude dual chamber RF device implanted in October 2011, coronary artery disease, and dysphagia.   Dustin Blankenship has been followed in the community in the past and most recently by the telephonic case management team at [facility]. Dustin Blankenship was referred to the community team for concerns related to medication non-adherence, new symptoms, and knowledge deficits related to his plan of care.   Dustin Blankenship lives at home in [place] with his wife Dustin Blankenship and has 24/7 private duty paid caregivers. His primary caregiver who is very knowledge about Dustin Blankenship needs is Dustin Blankenship. Dustin Blankenship has given permission to discuss his healthcare needs with Dustin Blankenship. Dustin Blankenship has a daughter who lives in [name] and visits a few times a year. She keeps in touch by phone and is supportive.   Dustin Blankenship: "I have to get my eyes right before things get really bad."  Objective:  Review of Systems  Constitutional: Negative.   HENT: Negative.   Eyes: Positive for blurred vision.  Respiratory: Negative.   Cardiovascular: Negative for chest pain, palpitations and leg swelling.  Gastrointestinal: Negative.   Genitourinary: Negative.   Musculoskeletal: Positive for myalgias. Negative for falls.  Skin:       Skin lesion biopsy performed at the office of Dr. Sinda Du; dressing in place to lateral left lower  leg   Neurological: Negative.   Psychiatric/Behavioral: Positive for memory loss.    Physical Exam  Constitutional: He is oriented to person, place, and time. He appears well-developed and well-nourished. He is active.  Non-toxic appearance. He has a sickly appearance. He does not appear ill.  Cardiovascular: Normal rate and regular rhythm.   Respiratory: Effort normal.   Neurological: He is alert and oriented to person, place, and time.  Skin: Skin is warm and dry.  Psychiatric: He has a normal mood and affect. His speech is normal and behavior is normal. Thought content normal. He expresses impulsivity. He exhibits abnormal recent memory and abnormal remote memory.    Encounter Medications:   Outpatient Encounter Prescriptions as of 09/06/2016  Medication Sig Note  . Acetaminophen (TYLENOL ARTHRITIS PAIN PO) Take 650 mg by mouth 2 (two) times daily as needed.    Marland Kitchen allopurinol (ZYLOPRIM) 300 MG tablet Take 300 mg by mouth daily.   Marland Kitchen ALPRAZolam (XANAX) 0.25 MG tablet Take 0.125 mg by mouth at bedtime as needed for anxiety.    . Carboxymethylcellulose Sodium 0.25 % SOLN Apply 1 drop to eye 4 (four) times daily as needed. 08/29/2016: Thera Tears  . COMBIGAN 0.2-0.5 % ophthalmic solution 1 drop every 12 (twelve) hours. Left Eye   . docusate sodium (COLACE) 100 MG capsule Take 100 mg by mouth 2 (two) times daily as needed for mild constipation.   Marland Kitchen escitalopram (LEXAPRO) 10 MG tablet Take 10 mg by mouth at bedtime.    Marland Kitchen ipratropium (ATROVENT) 0.06 % nasal spray Place 2 sprays into both nostrils 4 (four) times daily.    Marland Kitchen levothyroxine (SYNTHROID, LEVOTHROID) 125 MCG tablet Take 125 mcg by mouth daily.   Marland Kitchen LORazepam (ATIVAN) 1 MG tablet Take 1 tablet (1 mg total) by mouth 2 (two) times daily.   . nitroGLYCERIN (NITROSTAT) 0.4 MG SL tablet Place 0.4 mg under the tongue every 5 (five) minutes as needed. Chest  Pain 12/15/2015: On hand; new bottle obtained December  . pilocarpine (PILOCAR) 1 % ophthalmic solution Place 1 drop into both eyes 3 (three) times daily.    Dustin Blankenship Glycol-Propyl Glycol (SYSTANE) 0.4-0.3 % GEL Apply 1-2 drops to eye 4 (four) times daily as needed. Per wife sometimes he may use 3 drops in the right eye.    . polyethylene glycol (MIRALAX / GLYCOLAX) packet Take 17 g by mouth daily as needed.    . saw palmetto 500 MG capsule Take 500 mg by mouth  2 (two) times daily.     Assessment:  82 year old gentleman living in [place] with his wife. [name] has paid in home caregivers 24/7. He has dementia and recently began having side effects related to use of over the counter/unprescribed supplements. He has since stopped taking the OTC/unprescribed medications, has seen his primary care provider in follow up and is scheduled to see a urologist next month.   Acute Health Condition (skin lesion left leg) - Dustin Blankenship had a left leg lesion lanced in Dr. [name]' office last week. The lesion is being cleaned with mild soap and water, dried, and having a bandage with neosporin applied daily. He is taking oral doxycycline 168m by mouth twice daily.  Medication Concerns:  OTC Supplements - over the last few months, Mr. MBjellandordered "male enhancement" supplements called "T Boost"and "Vioten Plus" via the internet; he began having chest pain and taking nitroglycerin and having subsequent hypotension, nausea, and diarrhea; he has since stopped taking stopped taking the supplement   I visited Mr. MSaundersat home on 8/18 and reviewed Mr. MLindahlprescribed medications with him and his wife and primary caregiver CGilda Blankenship I have advised Mr. MWaringto take ONLY medications that are prescribed to him by his doctors. I also referred Mr. MBeneketo our pharmacy team and he was seen in person by KBennye AlmPharmD.     Eye Drops - Mr. MCzerwinskiand his wife report today that there is "an iPresenter, broadcasting and he was told his Pilocarpine ($11.16) and Combigan ($37.97)  would cost $100 each out of pocket. I reached out to C[place]iaand spoke with the pharmacist SNicki Reaperwho checked and found that Dustin Blankenship copays for refills today will be as follows:    Pilocarpine ($11.16) and Combigan ($37.97)   These prescriptions will be filled and delivered today. We reviewed prescribing instructions and Mr. MNoxonwill resume administration of his eye drops tonight with  the assistance of his wife and caregivers.  [facility] and has a follow up appointment with him next week.    Home Safety/Fall Risk- Mr. MMcpheewas high risk for fall prior to his use of over the counter medications; he ambulates with a walker or wheelchair and requires assistance from his caregivers to move about the home and perform adl's/iadl's; I have discussed and reinforced with him and his wife and caregiver the importance of medication adherence in reducing his fall risk; we discussed fall risk reduction strategies at length. He has occasionally used his wife's Xanax for insomnia. This was discussed and advised against during his pharmacy visit last week.    Cognitive Decline - Mr. MBrodrickadmits himself that his memory is worsening. Mrs. MDoyle Askewand [name](primary caregiver) report that Mr. MMonnieris sleeping during the day and having difficulty sleeping during the evening and night. Since last week, Mrs. MAllredreports that Mr. MDoyle Askew  is having significant lapses in memory and judgement, sometimes not even remembering where he is.    Chronic Health Condition (Dysphagia) - Mr. Shipper recently saw his gastroenterologist and received a confirmed diagnosis of esophageal dysphagia secondary to esophageal motility disorder. He was given a prescription for refill on Lorazepam 11m by mouth twice daily x 1 month with 2 refills. Dr. RLaural Goldennoted that this dose can be decreased if Mr. MJohansondevelops drowsiness. He is to be seen in the office again for follow up in 6 months.   We have reviewed swallowing precautions and I provided printed materials on dysphagia and swallowing precautions. His wife and paid caregivers are very knowledgeable about food preparation and precautions as related to Mr. Gladwell dysphagia.   Plan:   Mr. MMorgenthalerwill take ONLY medications prescribed to him by his doctors.   Mr. MSmeltz caregivers will continue to check  and record Mr. MCarchiblood pressure daily and will report blood pressure <<037systolic, bradycardia w/ heart rate <60, chest pain, shortness of breath, dizziness/lightheadedness, or any other new or worsened symptom.   I will call or see Mr. MSaxerweekly x the next month to follow up on medication management/adherence needs and home safety/fall risk reduction strategies.    [facility]Management  ([phone]

## 2016-09-12 DIAGNOSIS — H18462 Peripheral corneal degeneration, left eye: Secondary | ICD-10-CM | POA: Diagnosis not present

## 2016-09-13 ENCOUNTER — Other Ambulatory Visit: Payer: Self-pay | Admitting: *Deleted

## 2016-09-13 NOTE — Patient Outreach (Signed)
[name]) Care Management  09/13/2016  Dustin Blankenship 05/02/1920 [id number]  Unable to reach [name] by phone today.   Plan: I will reach out to [name] by phone next week.    [facility]  [phone]

## 2016-09-17 ENCOUNTER — Ambulatory Visit (INDEPENDENT_AMBULATORY_CARE_PROVIDER_SITE_OTHER): Payer: Medicare Other | Admitting: Urology

## 2016-09-17 DIAGNOSIS — N401 Enlarged prostate with lower urinary tract symptoms: Secondary | ICD-10-CM

## 2016-09-20 ENCOUNTER — Other Ambulatory Visit: Payer: Self-pay | Admitting: *Deleted

## 2016-09-20 ENCOUNTER — Encounter: Payer: Self-pay | Admitting: *Deleted

## 2016-09-20 NOTE — Patient Outreach (Signed)
[facility]) Care Management  09/20/2016  KINGSTYN DERUITER Oct 25, 1920 [id number]   TYGE SOMERS is an 82 y.o. male withhistory of COPD, osteoarthritis, UTIs, afib with implanted St. Jude dual chamber RF device implanted in October 2011, coronary artery disease, and dysphagia.   Mr. Carpenito has been followed in the community in the past and most recently by the telephonic case management team at [facility]. Mr. Halpin was referred to the community team for concerns related to medication non-adherence, new symptoms, and knowledge deficits related to his plan of care.   Mr. Reuss lives at home in [place] with his wife Michiel Cowboy and has 24/7 private duty paid caregivers. His primary caregiver who is very knowledge about [name] needs is [name] [phone]. Mr. Fetters has given permission to discuss his healthcare needs with Ms. Charissa Bash. Mr. Waddington has a daughter who lives in [name] and visits a few times a year. She keeps in touch by phone and is supportive.   Assessment:  82 year old gentleman living in [place] with his wife. Mr Strauch has paid in home caregivers 24/7. He has dementia and recently began having side effects related to use of over the counter/unprescribed supplements. He has since stopped taking the OTC/unprescribed medications, has seen his primary care provider and urologist in follow up.   Acute Health Condition (skin lesion left leg) - Mr. Zuckerman had a left leg lesion lanced in Dr. [name]' office 2 weeks ago. The lesion is being cleaned with mild soap and water, dried, and having a bandage with neosporin applied daily. He has completed a course of oral doxycycline. Mrs. Qualley reports that the lesion "looks better".  Medication Concerns:  OTC Supplements - over the last few months, Mr. Hulbert ordered "male enhancement" supplements called "T Boost"and "Vioten Plus" via the internet; he began having chest pain and taking nitroglycerin and  having subsequent hypotension, nausea, and diarrhea; he has since stopped taking stopped taking the supplement   Mr. Pinkham was referred to our pharmacy team and seen in person by [name] PharmD.    Plan:   Mr. Hollingsworth will take ONLY medications prescribed to him by his doctors.   Mr. Gens' caregivers will continue to check and record Mr. Gaudin blood pressure daily and will report blood pressure <016 systolic, bradycardia w/ heart rate <60, chest pain, shortness of breath, dizziness/lightheadedness, or any other new or worsened symptom.   I will call or see Mr. Anaya weekly x the next month to follow up on medication management/adherence needs and home safety/fall risk reduction strategies.    [facility]  [phone]

## 2016-09-26 ENCOUNTER — Other Ambulatory Visit: Payer: Self-pay | Admitting: *Deleted

## 2016-09-26 NOTE — Patient Outreach (Signed)
[facility]) Care Management  09/26/2016  Dustin Blankenship 06-29-1920 [id number]  [name], CNA and primary caregiver for [name] reached out to me today to report that [name] has been having worsening localized right low back pain over the course of this week. Today, [name] reports that [name]' pain is "worse than it has been all week" and that upon her examination, she noted new swelling that "looks like a pouch on the right side". Ms. Charissa Bash states she applied a warm heating pad over a bath towel (so as not to directly touch the skin with the heating pad) and this gave [name] some relief. Ms. Charissa Bash says that Tylenol did not provide any relief.   I reached out to Dr. Sinda Du who recommended an xray of the back and had an order sent for the same to [facility]. I returned a call to Ms. Charissa Bash who will provide transportation for [name] to the hospital and will remain with him and make sure he returns home and gets settled in.   Plan: I will follow up with [name] no later than Monday. Ms. Charissa Bash passed along a message to [name] advising him to call for new or worsened symptoms.    [facility]  [phone]

## 2016-09-30 ENCOUNTER — Ambulatory Visit (HOSPITAL_COMMUNITY)
Admission: RE | Admit: 2016-09-30 | Discharge: 2016-09-30 | Disposition: A | Discharge status: Home / Self Care | Payer: Medicare Other | Source: Ambulatory Visit | Attending: Pulmonary Disease | Admitting: Pulmonary Disease

## 2016-09-30 ENCOUNTER — Other Ambulatory Visit (HOSPITAL_COMMUNITY): Payer: Self-pay | Admitting: Pulmonary Disease

## 2016-09-30 DIAGNOSIS — M47896 Other spondylosis, lumbar region: Secondary | ICD-10-CM | POA: Insufficient documentation

## 2016-09-30 DIAGNOSIS — M545 Low back pain: Secondary | ICD-10-CM

## 2016-10-02 ENCOUNTER — Other Ambulatory Visit: Payer: Self-pay | Admitting: *Deleted

## 2016-10-02 ENCOUNTER — Encounter: Payer: Self-pay | Admitting: *Deleted

## 2016-10-02 NOTE — Patient Outreach (Signed)
[facility]) Care [place]   10/02/2016  Dustin Blankenship 16-Jan-1920 [id number]  Dustin Blankenship is an 82 y.o. male withhistory of COPD, osteoarthritis, UTIs, afib with implanted St. Jude dual chamber RF device implanted in October 2011, coronary artery disease, and dysphagia.   Dustin Blankenship has been followed in the community in the past and most recently by the telephonic case [place] team at [facility]. Dustin Blankenship was referred to the community team for concerns related to medication non-adherence, new symptoms, and knowledge deficits related to his plan of care.   Dustin Blankenship lives at home in [place] with his wife [place] and has 24/7 private duty paid caregivers. His primary caregiver who is very knowledge about Dustin Blankenship needs is [place] Blank[place] [phone]. Dustin Blankenship has given permission to discuss his Blankenship needs with Dustin Blank[place]. Dustin Blankenship has a daughter who lives in [name] and visits a few times a year. She keeps in touch by phone and is supportive.   Dustin Blankenship recently has had extensive dental work and asked that I follow up with him about this today.   Subjective: "My mouth feels better but I need helping getting the bills for the dentist straightened out."  Objective:  BP (!) 146/82 (BP Location: Right Arm, Patient Position: Sitting)   Pulse 72   SpO2 95%   Review of Systems  Constitutional: Negative.   HENT: Negative.   Eyes: Negative.   Respiratory: Negative.   Cardiovascular: Negative.   Gastrointestinal: Negative.   Musculoskeletal: Negative.  Negative for falls.  Skin: Negative.   Neurological: Negative.   Psychiatric/Behavioral: Negative.     Physical Exam  Constitutional: He is oriented to person, place, and time. He appears well-developed and well-nourished. He is active.  Non-toxic appearance. He does not have a sickly appearance. He does not appear ill.  Cardiovascular: Normal rate and regular rhythm.   Respiratory: Effort  normal. He has no wheezes. He has no rhonchi. He has rales.  GI: Soft. Bowel sounds are normal.  Neurological: He is alert and oriented to person, place, and time.  Skin: Skin is warm, dry and intact.  Psychiatric: He has a normal mood and affect. His speech is normal and behavior is normal. Cognition and memory are normal.    Encounter Medications:   Outpatient Encounter Prescriptions as of 10/02/2016  Medication Sig Note  . Acetaminophen (TYLENOL ARTHRITIS PAIN PO) Take 650 mg by mouth 2 (two) times daily as needed.    Marland Kitchen allopurinol (ZYLOPRIM) 300 MG tablet Take 300 mg by mouth daily.   Marland Kitchen ALPRAZolam (XANAX) 0.25 MG tablet Take 0.125 mg by mouth at bedtime as needed for anxiety.    . Carboxymethylcellulose Sodium 0.25 % SOLN Apply 1 drop to eye 4 (four) times daily as needed. 08/29/2016: Thera Tears  . COMBIGAN 0.2-0.5 % ophthalmic solution 1 drop every 12 (twelve) hours. Left Eye   . docusate sodium (COLACE) 100 MG capsule Take 100 mg by mouth 2 (two) times daily as needed for mild constipation.   Marland Kitchen doxycycline (VIBRAMYCIN) 100 MG capsule Take 100 mg by mouth 2 (two) times daily.   Marland Kitchen escitalopram (LEXAPRO) 10 MG tablet Take 10 mg by mouth at bedtime.    Marland Kitchen ipratropium (ATROVENT) 0.06 % nasal spray Place 2 sprays into both nostrils 4 (four) times daily.    Marland Kitchen levothyroxine (SYNTHROID, LEVOTHROID) 125 MCG tablet Take 125 mcg by mouth daily.   Marland Kitchen LORazepam (ATIVAN) 1 MG tablet Take 1  tablet (1 mg total) by mouth 2 (two) times daily.   . nitroGLYCERIN (NITROSTAT) 0.4 MG SL tablet Place 0.4 mg under the tongue every 5 (five) minutes as needed. Chest Pain 12/15/2015: On hand; new bottle obtained December  . pilocarpine (PILOCAR) 1 % ophthalmic solution Place 1 drop into both eyes 3 (three) times daily.    [place] (SYSTANE) 0.4-0.3 % GEL Apply 1-2 drops to eye 4 (four) times daily as needed. Per wife sometimes he may use 3 drops in the right eye.    . polyethylene Blankenship  (MIRALAX / GLYCOLAX) packet Take 17 g by mouth daily as needed.    . saw palmetto 500 MG capsule Take 500 mg by mouth 2 (two) times daily.     Assessment:  82 year old gentleman living in [place] with his wife. [place] Blank[place] has paid in home caregivers 24/7. He has dementia and most recently has had dental concerns and significant dental work done.    Dental Health Needs - Dustin. Blankenship had dental work, including placement of hardware done over the last few months. He changed dentists and is now seeing Dustin. Maury Blankenship and is scheduled for follow up on 10/10/16 @ 10:30 (3rd appointment); he has questions about billing/charges and I have notified the dental assistant [place] [phone]) who worked most closely with Dustin. Blankenship so that she can get him in touch with the financial representative in their practice.   Urology Concerns - Dustin. Blankenship is no longer taking over the counter "male enhancement" supplements and says his urologist told him he didn't need further intervention at this time. Dustin. Blankenship is interested in having his PSA checked as the last time he knows of that it was checked was in 2012.    Acute Health Condition (skin lesion left leg) - Dustin. Blankenship had a left leg lesion lanced in Dustin. Luan Blankenship' office a month ago. The skin at the site of the lesion is healed.   Plan:   I will follow up with Dustin. Blankenship by phone in 2 weeks.    [place]  [phone]

## 2016-10-22 DIAGNOSIS — Z23 Encounter for immunization: Secondary | ICD-10-CM | POA: Diagnosis not present

## 2016-10-25 DIAGNOSIS — H401122 Primary open-angle glaucoma, left eye, moderate stage: Secondary | ICD-10-CM | POA: Diagnosis not present

## 2016-11-01 ENCOUNTER — Other Ambulatory Visit: Payer: Self-pay | Admitting: *Deleted

## 2016-11-01 NOTE — Patient Outreach (Signed)
[facility]) Care Management  11/01/2016  DERYCK HIPPLER [birth year] [id number]  RYLEND PIETRZAK is an 82 y.o. male withhistory of COPD, osteoarthritis, UTIs, afib with implanted St. Jude dual chamber RF device implanted in October 2011, coronary artery disease, and dysphagia.   Mr. Sjogren has been followed in the community in the past and most recently by the telephonic case management team at [facility]. Mr. Jenkinson was referred to the community team for concerns related to medication non-adherence, new symptoms, and knowledge deficits related to his plan of care.   Mr. Olmeda lives at home in [place] with his wife Michiel Cowboy and has 24/7 private duty paid caregivers. His primary caregiver who is very knowledgeable about [name] needs and has permission by Mr. Davidian and his wife to share and receive information related to his needs is [name] [phone].  Mr. Corrales has a daughter who lives in [name] and visits a few times a year. She keeps in touch by phone and is supportive.   Assessment:  82 year old gentleman living in [place] with his wife. Mr Tarango has paid in home caregivers 24/7. He has dementia and most recently has had dental concerns and significant dental work done.   Dental Health Needs - Mr. Coxe had dental work, including placement of hardware done over the last few months. He changed dentists and is now seeing Dr. Maury Dus with whom he most recently had follow up on 10/10/16 @ 10:30 (3rd appointment); I put him in contact with the dental assistant Alen Bleacher [phone]) to address billing/charges about which he had questions.   Urolology Concerns - Mr. Hessling is no longer taking over the counter "male enhancement" supplements and says his urologist told him he didn't need further intervention at this time.   Acute Health Condition (skin lesion left leg) - Mr. Lamp had a left leg lesion lanced in Dr. [name]' office 2 months ago and  his wife Michiel Cowboy reports today that the skin at the site of the lesion is healed and he has had no problems with it.   Plan:   I will reach out to Mr. Oblinger by phone again in December then will transition him to telephonic case management unless new issues arise. He is frail, at high risk for fall, and has had worsening cognitive decline over the last few months. His wife has also experienced general decline in her health so I believe it would be beneficial to maintain oversight.    [facility]  [phone]

## 2016-11-26 ENCOUNTER — Encounter: Payer: Self-pay | Admitting: Family Medicine

## 2016-11-26 ENCOUNTER — Ambulatory Visit (INDEPENDENT_AMBULATORY_CARE_PROVIDER_SITE_OTHER): Payer: Medicare Other | Admitting: Family Medicine

## 2016-11-26 VITALS — BP 110/74 | HR 98 | Temp 97.4°F | Resp 20 | Ht 71.0 in | Wt 210.0 lb

## 2016-11-26 DIAGNOSIS — J431 Panlobular emphysema: Secondary | ICD-10-CM

## 2016-11-26 DIAGNOSIS — I480 Paroxysmal atrial fibrillation: Secondary | ICD-10-CM | POA: Diagnosis not present

## 2016-11-26 DIAGNOSIS — E039 Hypothyroidism, unspecified: Secondary | ICD-10-CM | POA: Diagnosis not present

## 2016-11-26 DIAGNOSIS — I2781 Cor pulmonale (chronic): Secondary | ICD-10-CM | POA: Diagnosis not present

## 2016-11-26 DIAGNOSIS — Z23 Encounter for immunization: Secondary | ICD-10-CM | POA: Diagnosis not present

## 2016-11-26 LAB — CBC WITH DIFFERENTIAL/PLATELET
BASOS PCT: 0 %
Basophils Absolute: 0 cells/uL (ref 0–200)
Eosinophils Absolute: 268 cells/uL (ref 15–500)
Eosinophils Relative: 4 %
HEMATOCRIT: 36.9 % — AB (ref 38.5–50.0)
Hemoglobin: 11.9 g/dL — ABNORMAL LOW (ref 13.0–17.0)
LYMPHS ABS: 1809 {cells}/uL (ref 850–3900)
LYMPHS PCT: 27 %
MCH: 29.9 pg (ref 27.0–33.0)
MCHC: 32.2 g/dL (ref 32.0–36.0)
MCV: 92.7 fL (ref 80.0–100.0)
MONO ABS: 804 {cells}/uL (ref 200–950)
MPV: 9.1 fL (ref 7.5–12.5)
Monocytes Relative: 12 %
Neutro Abs: 3819 cells/uL (ref 1500–7800)
Neutrophils Relative %: 57 %
Platelets: 232 10*3/uL (ref 140–400)
RBC: 3.98 MIL/uL — AB (ref 4.20–5.80)
RDW: 14.4 % (ref 11.0–15.0)
WBC: 6.7 10*3/uL (ref 3.8–10.8)

## 2016-11-26 LAB — TSH: TSH: 1.09 mIU/L (ref 0.40–4.50)

## 2016-11-26 NOTE — Progress Notes (Signed)
Subjective:    Patient ID: [place], male    DOB: 13-Aug-1920, 83 y.o.   MRN: [id number]  HPI Here to establish care.  Extensive PMH including paroxysmal a fib, cor pulmonale, pulm htn, COPD gout, hypothyroidism.  Is not on anticoagulation due to memory loss and fall risk.  Not even on aspirin. Not sure why.  Has mild dementia.  Pleasantly confused on exam today.  Due for prevnar 13.  Due for labwork as well.  Otherwise no concerns. Past Medical History:  Diagnosis Date  . Atrial fib/flutter, transient    not on warfarin due to risk of falls ,on aspirin for prophylaxis  . CHF (congestive heart failure) (HCC)    right heart failure and pulm htn due to chronic hypoxia from COPD  . COPD (chronic obstructive pulmonary disease) ([place])   . GERD (gastroesophageal reflux disease)   . Glaucoma   . Glaucoma   . Gout   . Hypothyroid   . Pacemaker 09/20/2010    ST Jude Accent DR RF device  MODEL #[id number],SERIAL #[id number]  . Pulmonary HTN    DUE TO CHRONIC HYPOXIA   FROM COPD  . Sinus node dysfunction ([place])    Dual-chamber permanent pacermaker ST JUDE   Past Surgical History:  Procedure Laterality Date  . COLONOSCOPY W/ POLYPECTOMY    . DOPPLER ECHOCARDIOGRAPHY  07/25/2011   EF =50-55%  . ESOPHAGEAL DILATION N/A 02/24/2015   Procedure: ESOPHAGEAL DILATION;  Surgeon: [name], MD;  Location: [facility]ITE;  Service: Endoscopy;  Laterality: N/A;  . ESOPHAGOGASTRODUODENOSCOPY N/A 02/24/2015   Procedure: ESOPHAGOGASTRODUODENOSCOPY (EGD);  Surgeon: [name], MD;  Location: [facility] END[facility] SUITE;  Service: Endoscopy;  Laterality: N/A;  10:00  . INSERT / REPLACE / REMOVE PACEMAKER  09/20/2010   ST JUDE ACCENT-dual chamber ;    . LUNG BIOPSY    . NM MYOCAR PERF WALL MOTION  07/25/2011   Current Outpatient Prescriptions on File Prior to Visit  Medication Sig Dispense Refill  . Acetaminophen (TYLENOL ARTHRITIS PAIN PO) Take 650 mg by mouth 2 (two) times daily as needed.     Marland Kitchen allopurinol  (ZYLOPRIM) 300 MG tablet Take 300 mg by mouth daily.    . Carboxymethylcellulose Sodium 0.25 % SOLN Apply 1 drop to eye 4 (four) times daily as needed.    . COMBIGAN 0.2-0.5 % ophthalmic solution 1 drop every 12 (twelve) hours. Left Eye    . docusate sodium (COLACE) 100 MG capsule Take 100 mg by mouth 2 (two) times daily as needed for mild constipation.    Marland Kitchen escitalopram (LEXAPRO) 10 MG tablet Take 10 mg by mouth at bedtime.     Marland Kitchen levothyroxine (SYNTHROID, LEVOTHROID) 125 MCG tablet Take 125 mcg by mouth daily.    Marland Kitchen LORazepam (ATIVAN) 1 MG tablet Take 1 tablet (1 mg total) by mouth 2 (two) times daily. 60 tablet 2  . nitroGLYCERIN (NITROSTAT) 0.4 MG SL tablet Place 0.4 mg under the tongue every 5 (five) minutes as needed. Chest Pain    . pilocarpine (PILOCAR) 1 % ophthalmic solution Place 1 drop into both eyes 3 (three) times daily.     [name] Glycol-Propyl Glycol (SYSTANE) 0.4-0.3 % GEL Apply 1-2 drops to eye 4 (four) times daily as needed. Per wife sometimes he may use 3 drops in the right eye.     . polyethylene glycol (MIRALAX / GLYCOLAX) packet Take 17 g by mouth daily as needed.     . saw  palmetto 500 MG capsule Take 500 mg by mouth 2 (two) times daily.     Marland Kitchen doxycycline (VIBRAMYCIN) 100 MG capsule Take 100 mg by mouth 2 (two) times daily.    Marland Kitchen ipratropium (ATROVENT) 0.06 % nasal spray Place 2 sprays into both nostrils 4 (four) times daily.      No current facility-administered medications on file prior to visit.    Allergies  Allergen Reactions  . Morphine And Related   . Multaq [Dronedarone]   . Penicillins Other (See Comments)    Unknown. Patient received zosyn this am at 0415(10/24/15) without any problems  . Latex Rash  . Sulfonamide Derivatives Rash  . Testosterone Rash    The cream form causes a rash when rubbed on skin.    Social History   Social History  . Marital status: Married    Spouse name: Michiel Cowboy  . Number of children: 3  . Years of education: College    Occupational History  . Retired Retired   Social History Main Topics  . Smoking status: Former Smoker    Packs/day: 3.00    Years: 18.00    Types: Cigarettes    Start date: 02/17/1952    Quit date: 12/16/1953  . Smokeless tobacco: Never Used  . Alcohol use No  . Drug use: No  . Sexual activity: Not on file   Other Topics Concern  . Not on file   Social History Narrative   Patient lives at home with spouse currently.   Caffeine Use: 1 cup of coffee in a.m and black tea in the afternoon   No family history on file.   Review of Systems  All other systems reviewed and are negative.      Objective:   Physical Exam  Constitutional: He appears well-developed and well-nourished.  Neck: Neck supple. No JVD present.  Cardiovascular: Normal rate, regular rhythm and normal heart sounds.   Pulmonary/Chest: Effort normal and breath sounds normal. No respiratory distress. He has no wheezes. He has no rales.  Abdominal: Soft. Bowel sounds are normal. He exhibits no distension. There is no tenderness. There is no rebound and no guarding.  Musculoskeletal: He exhibits no edema.  Lymphadenopathy:    He has no cervical adenopathy.  Neurological: He is alert. No cranial nerve deficit. He exhibits normal muscle tone. Coordination normal.  Skin: No rash noted.          Assessment & Plan:  Est care COPD Paroxysmal a fib pulm HTN CHF/cor pulmonale  Check cbc, cmp, TSH.  Recommended aspirin 325 mg poqday for CVA prevention.  Received prevnar 13.  Regular anticipatory guidance provided.  Not candidate for cancer screening due to age.  Begin APAP 325 TID for joint pain.  Recheck in 3 months.

## 2016-11-27 ENCOUNTER — Telehealth: Payer: Self-pay | Admitting: Family Medicine

## 2016-11-27 ENCOUNTER — Other Ambulatory Visit: Payer: Self-pay | Admitting: *Deleted

## 2016-11-27 DIAGNOSIS — B351 Tinea unguium: Secondary | ICD-10-CM

## 2016-11-27 LAB — COMPLETE METABOLIC PANEL WITH GFR
ALT: 11 U/L (ref 9–46)
AST: 14 U/L (ref 10–35)
Albumin: 3.7 g/dL (ref 3.6–5.1)
Alkaline Phosphatase: 43 U/L (ref 40–115)
BUN: 25 mg/dL (ref 7–25)
CALCIUM: 9.1 mg/dL (ref 8.6–10.3)
CHLORIDE: 104 mmol/L (ref 98–110)
CO2: 31 mmol/L (ref 20–31)
CREATININE: 0.88 mg/dL (ref 0.70–1.11)
GFR, Est African American: 85 mL/min (ref >=60)
GFR, Est Non African American: 74 mL/min (ref >=60)
GLUCOSE: 95 mg/dL (ref 70–99)
Potassium: 4.5 mmol/L (ref 3.5–5.3)
Sodium: 139 mmol/L (ref 135–146)
Total Bilirubin: 0.4 mg/dL (ref 0.2–1.2)
Total Protein: 6.6 g/dL (ref 6.1–8.1)

## 2016-11-27 NOTE — Patient Outreach (Signed)
[facility]) Care Management  11/27/2016  TYSE AURIEMMA 03/10/1920 [id number]  Call received from Mr. Grantz and his wife today. They wanted to tell me they had a good visit with Dr. Dennard Schaumann yesterday. This was a new patient visit to establish care. [name] was very pleased with Dr. Samella Parr attentive and thorough exam. He related that Dr. Dennard Schaumann ordered labs and recommended that he start a regular aspirin once daily to help with stroke prevention. Mr. Palma received a Prevnar 13 vaccination. He is to see Dr. Dennard Schaumann again in 3 months.   Plan: I will follow up with Mr Pasha next week by phone and will plan a January face to face visit.    [facility]  [phone]

## 2016-11-27 NOTE — Telephone Encounter (Signed)
Patient calling to say that he was in to see dr pickard yesterday, however forgot to mention the toe fungas problem he had, could he please have referral to podiatrist if possible?[phone] (H)

## 2016-11-28 ENCOUNTER — Ambulatory Visit: Payer: Self-pay | Admitting: *Deleted

## 2016-11-28 NOTE — Telephone Encounter (Signed)
ok 

## 2016-11-28 NOTE — Telephone Encounter (Signed)
Referral placed

## 2016-11-29 ENCOUNTER — Encounter: Payer: Self-pay | Admitting: *Deleted

## 2016-12-02 ENCOUNTER — Other Ambulatory Visit: Payer: Self-pay | Admitting: *Deleted
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
## 2016-12-03 ENCOUNTER — Other Ambulatory Visit: Payer: Self-pay | Admitting: *Deleted

## 2016-12-03 ENCOUNTER — Ambulatory Visit: Payer: Medicare Other | Admitting: Family Medicine
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
## 2016-12-04 ENCOUNTER — Other Ambulatory Visit: Payer: Self-pay | Admitting: *Deleted

## 2016-12-04 NOTE — Patient Outreach (Signed)
[facility]) Care Management  12/04/2016  Dustin Blankenship 09/13/1920 [id number]  I spoke with Dustin Blankenship by phone today to follow up on re-scheduling an appointment with Dustin Blankenship, Dustin Blankenship' new cognitive changes, and now report of new urinary symptoms.   Appointments: Dustin Blankenship is helping [place] with scheduling an appointment for the week of 12/24/15 when Dustin Blankenship will have ready transportation.   Cognitive Changes: Dustin Blankenship reports that Dustin Blankenship has not had any acute cognitive changes or episodes but he continues to be confused about the "organization" he thinks he's been called to work with.   New Urinary Symptoms - Dustin Blankenship reports that Dustin Blankenship has been complaining of urinary frequency over the last 3-4 days. I notified Dustin Blankenship via Dustin Decamp LPN @ his office.   Plan: I will follow up with Dustin Blankenship by phone.    [facility]  [phone]

## 2016-12-05 ENCOUNTER — Other Ambulatory Visit: Payer: Self-pay | Admitting: Family Medicine

## 2016-12-05 ENCOUNTER — Other Ambulatory Visit: Payer: Self-pay | Admitting: *Deleted

## 2016-12-05 DIAGNOSIS — F22 Delusional disorders: Secondary | ICD-10-CM

## 2016-12-05 DIAGNOSIS — R410 Disorientation, unspecified: Secondary | ICD-10-CM

## 2016-12-05 NOTE — Patient Outreach (Signed)
[facility]) Care Management  12/05/2016  Dustin Blankenship [birth year] [id number]  I reached out to Dustin Blankenship to confirm an appointment scheduled for Dustin Blankenship with Dr. Dennard Schaumann for 12/25/15 @ 12:15pm. Dustin Blankenship will ensure that transportation is arranged between now and then.   In addition, Dr. Dennard Schaumann would like to have a urine collection done and taken to the lab for Dustin Blankenship. I will be in his area tomorrow and can drop a collection cup/bag at his home. I have spoken with his primary CNA [name] who has experience and knowledge in specimen collection and will help Dustin Blankenship and deliver the specimen to [facility] in [place] where an order has been faxed by Shary Decamp at Dr. Samella Parr office.   Plan: I will follow up with Dustin Blankenship by phone next week. Dustin Blankenship will call on Dustin Blankenship behalf if he has any new or worsening symptoms.    [facility]  [phone]

## 2016-12-06 ENCOUNTER — Ambulatory Visit: Payer: Self-pay | Admitting: *Deleted

## 2016-12-13 ENCOUNTER — Encounter: Payer: Self-pay | Admitting: Family Medicine

## 2016-12-13 ENCOUNTER — Ambulatory Visit (INDEPENDENT_AMBULATORY_CARE_PROVIDER_SITE_OTHER): Payer: Medicare Other | Admitting: Family Medicine

## 2016-12-13 VITALS — BP 112/70 | HR 84 | Temp 97.4°F | Resp 18 | Ht 71.0 in | Wt 212.0 lb

## 2016-12-13 DIAGNOSIS — F05 Delirium due to known physiological condition: Secondary | ICD-10-CM | POA: Diagnosis not present

## 2016-12-13 DIAGNOSIS — B351 Tinea unguium: Secondary | ICD-10-CM | POA: Diagnosis not present

## 2016-12-13 DIAGNOSIS — M79673 Pain in unspecified foot: Secondary | ICD-10-CM | POA: Diagnosis not present

## 2016-12-13 DIAGNOSIS — K5909 Other constipation: Secondary | ICD-10-CM

## 2016-12-13 NOTE — Progress Notes (Signed)
Subjective:    Patient ID: Dustin Blankenship, male    DOB: 1920-06-14, 82 y.o.   MRN: [id number]  HPI Over the last 2 weeks, the patient has had 2 episodes of sundowning. One episode he became confused and thought he was locked in the church at night. This occurred around 1 AM. He called one of his neighbors for help. The second episode, the patient was dressed and was wearing a hat ready to leave the home. This occurred at 1 in the morning. He thought he was going to appointment. Other than that he's been his normal behavior. He also had one episode of trace hematochezia. He has been suffering from severe constipation. He strained to have a bowel movement. There was trace blood on the toilet tissue afterwards. I performed a rectal exam today. There is no external hemorrhoid. There is no anal fissure. There is no external tear. There is no palpable rectal mass although there was soft stool in the rectal vault Past Medical History:  Diagnosis Date  . Atrial fib/flutter, transient    not on warfarin due to risk of falls ,on aspirin for prophylaxis  . CHF (congestive heart failure) (HCC)    right heart failure and pulm htn due to chronic hypoxia from COPD  . COPD (chronic obstructive pulmonary disease) ([place])   . GERD (gastroesophageal reflux disease)   . Glaucoma   . Glaucoma   . Gout   . Hypothyroid   . Pacemaker 09/20/2010    ST Jude Accent DR RF device  MODEL #[id number],SERIAL #[id number]  . Pulmonary HTN    DUE TO CHRONIC HYPOXIA   FROM COPD  . Sinus node dysfunction ([place])    Dual-chamber permanent pacermaker ST JUDE   Past Surgical History:  Procedure Laterality Date  . COLONOSCOPY W/ POLYPECTOMY    . DOPPLER ECHOCARDIOGRAPHY  07/25/2011   EF =50-55%  . ESOPHAGEAL DILATION N/A 02/24/2015   Procedure: ESOPHAGEAL DILATION;  Surgeon: [name], MD;  Location: [facility];  Service: Endoscopy;  Laterality: N/A;  . ESOPHAGOGASTRODUODENOSCOPY N/A 02/24/2015   Procedure:  ESOPHAGOGASTRODUODENOSCOPY (EGD);  Surgeon: [name], MD;  Location: [facility];  Service: Endoscopy;  Laterality: N/A;  10:00  . INSERT / REPLACE / REMOVE PACEMAKER  09/20/2010   ST JUDE ACCENT-dual chamber ;    . LUNG BIOPSY    . NM MYOCAR PERF WALL MOTION  07/25/2011   Current Outpatient Prescriptions on File Prior to Visit  Medication Sig Dispense Refill  . Acetaminophen (TYLENOL ARTHRITIS PAIN PO) Take 650 mg by mouth 2 (two) times daily as needed.     Marland Kitchen alfuzosin (UROXATRAL) 10 MG 24 hr tablet     . allopurinol (ZYLOPRIM) 300 MG tablet Take 300 mg by mouth daily.    . Carboxymethylcellulose Sodium 0.25 % SOLN Apply 1 drop to eye 4 (four) times daily as needed.    . COMBIGAN 0.2-0.5 % ophthalmic solution 1 drop every 12 (twelve) hours. Left Eye    . docusate sodium (COLACE) 100 MG capsule Take 100 mg by mouth 2 (two) times daily as needed for mild constipation.    Marland Kitchen donepezil (ARICEPT) 10 MG tablet Take 10 mg by mouth at bedtime.     Marland Kitchen doxycycline (VIBRAMYCIN) 100 MG capsule Take 100 mg by mouth 2 (two) times daily.    Marland Kitchen escitalopram (LEXAPRO) 10 MG tablet Take 10 mg by mouth at bedtime.     Marland Kitchen ipratropium (ATROVENT) 0.06 % nasal spray Place  2 sprays into both nostrils 4 (four) times daily.     Marland Kitchen levothyroxine (SYNTHROID, LEVOTHROID) 125 MCG tablet Take 125 mcg by mouth daily.    Marland Kitchen LORazepam (ATIVAN) 1 MG tablet Take 1 tablet (1 mg total) by mouth 2 (two) times daily. 60 tablet 2  . nitroGLYCERIN (NITROSTAT) 0.4 MG SL tablet Place 0.4 mg under the tongue every 5 (five) minutes as needed. Chest Pain    . OXYGEN Inhale 2 L into the lungs continuous. [employer]    . pilocarpine (PILOCAR) 1 % ophthalmic solution Place 1 drop into both eyes 3 (three) times daily.     [name] Glycol-Propyl Glycol (SYSTANE) 0.4-0.3 % GEL Apply 1-2 drops to eye 4 (four) times daily as needed. Per wife sometimes he may use 3 drops in the right eye.     . polyethylene glycol (MIRALAX / GLYCOLAX) packet  Take 17 g by mouth daily as needed.     . saw palmetto 500 MG capsule Take 500 mg by mouth 2 (two) times daily.      No current facility-administered medications on file prior to visit.    Allergies  Allergen Reactions  . Morphine And Related   . Multaq [Dronedarone]   . Penicillins Other (See Comments)    Unknown. Patient received zosyn this am at 0415(10/24/15) without any problems  . Latex Rash  . Sulfonamide Derivatives Rash  . Testosterone Rash    The cream form causes a rash when rubbed on skin.    Social History   Social History  . Marital status: Married    Spouse name: Michiel Cowboy  . Number of children: 3  . Years of education: College   Occupational History  . Retired Retired   Social History Main Topics  . Smoking status: Former Smoker    Packs/day: 3.00    Years: 18.00    Types: Cigarettes    Start date: 02/17/1952    Quit date: 12/16/1953  . Smokeless tobacco: Never Used  . Alcohol use No  . Drug use: No  . Sexual activity: Not on file   Other Topics Concern  . Not on file   Social History Narrative   Patient lives at home with spouse currently.   Caffeine Use: 1 cup of coffee in a.m and black tea in the afternoon   No family history on file.   Review of Systems  All other systems reviewed and are negative.      Objective:   Physical Exam  Constitutional: He appears well-developed and well-nourished.  Neck: Neck supple. No JVD present.  Cardiovascular: Normal rate, regular rhythm and normal heart sounds.   Pulmonary/Chest: Effort normal and breath sounds normal. No respiratory distress. He has no wheezes. He has no rales.  Abdominal: Soft. Bowel sounds are normal. He exhibits no distension. There is no tenderness. There is no rebound and no guarding.  Musculoskeletal: He exhibits no edema.  Lymphadenopathy:    He has no cervical adenopathy.  Neurological: He is alert. No cranial nerve deficit. He exhibits normal muscle tone. Coordination normal.    Skin: No rash noted.          Assessment & Plan:   Sundowning  Chronic constipation  Patient is likely sundowning due to his dementia. At the present time behavior has been mild. Therefore after discussing the situation with his wife, we elected not to start an antipsychotic medication at night. They will arrange 24-hour/day, 7day/week supervision to prevent an accident. Should behavior worsen  I would rec Seroquel at night. I will also start Linzess 145 mcg poqday for chronic constipation.  Use Fleets enema for constipation.

## 2016-12-17 ENCOUNTER — Other Ambulatory Visit: Payer: Self-pay | Admitting: *Deleted

## 2016-12-17 NOTE — Patient Outreach (Signed)
[facility]) Care Management  12/17/2016  DAMARIUS KARNES Sep 17, 1920 [id number]  Leondro Coryell Myersis an 83 y.o.malewithhistory of COPD, osteoarthritis, UTIs, afib with implanted St. Jude dual chamber RF device implanted in October 2011, coronary artery disease, and dysphagia.   Mr. Borgen has been followed in the community in the past and most recently by the telephonic case management team at [facility]. Mr. Geller was referred to the community team for concerns related to medication non-adherence, new symptoms, and knowledge deficits related to his plan of care.   Mr. Graybeal lives at home in [place] with his wife Michiel Cowboy and has 24/7 private duty paid caregivers. His primary caregiver who is very knowledgeable about [name] needs and has permission by Mr. Suydam and his wife to share and receive information related to his needs is [name] [phone].  Mr. Karel has a daughter who lives in [name] and visits a few times a year. She keeps in touch by phone and is supportive.   Recently, Mr. Formby has been having confusion and generally worsening cognitive symptoms. I left a urine specimen collection bottle with his caregiver last week and the sample was delivered to the lab. We were able to schedule an appointment with Dr. Dennard Schaumann for 12/25/15 @ 12:15pm.   I called [name] today (given Mr. Clos recent worsening cognitive status) but was unable to reach her by phone OR leave a voice message.    Plan: I will follow up with Mr. & Mrs. Mccubbins during the next 2 weeks to follow up on his visit with Dr. Dennard Schaumann and on his general progress and case management needs.   [facility]  [phone]

## 2016-12-19 ENCOUNTER — Telehealth (INDEPENDENT_AMBULATORY_CARE_PROVIDER_SITE_OTHER): Payer: Self-pay | Admitting: Internal Medicine

## 2016-12-19 ENCOUNTER — Other Ambulatory Visit (INDEPENDENT_AMBULATORY_CARE_PROVIDER_SITE_OTHER): Payer: Self-pay | Admitting: Internal Medicine

## 2016-12-19 NOTE — Telephone Encounter (Signed)
Patient's care taker [name] called and stated that the patient needs a refill on Lorazepam.  Stated the patient is completely out and needs these as soon as possible.  Routing to [place] as she is the provider here today.  [phone]

## 2016-12-20 ENCOUNTER — Telehealth: Payer: Self-pay | Admitting: Family Medicine

## 2016-12-20 ENCOUNTER — Other Ambulatory Visit: Payer: Self-pay | Admitting: *Deleted

## 2016-12-20 MED ORDER — FUROSEMIDE 20 MG PO TABS: 20.0000 mg | ORAL_TABLET | Freq: Every day | ORAL | 3 refills | Status: DC

## 2016-12-20 NOTE — Telephone Encounter (Signed)
This has been taken care of.

## 2016-12-20 NOTE — Telephone Encounter (Signed)
This was sent yesterday. I was out of the office. It was not addressed by NP. [name] the sitter has presented to the office wanting to pick it up. I explained that I was waiting on Dr.Rehman, so that this could be addressed. She ask that she be called [phone]. Patient is totally out of  This medication.

## 2016-12-20 NOTE — Telephone Encounter (Signed)
Begin lasix 20 poqday and recheck in 1 week for swelling.  Seroquel would be option for his confusion but I would only use it if his behavior was dangerous.

## 2016-12-20 NOTE — Telephone Encounter (Signed)
Pt's caregiver aware and med sent to pharm and appt made.

## 2016-12-20 NOTE — Patient Outreach (Addendum)
[facility]) Care Management  12/20/2016  Dustin Blankenship 1920/12/02 [id number]  I received a call from [place], [name] primary caregiver. She wanted to update me on recent provider visits and inform me of new symptoms. Mrs. Charissa Bash said she went with [name] to the dentist and to his recent appointment with Dr. Dennard Schaumann. She says that Dr. Dennard Schaumann prescribed a treatment plan for [name] complaint of constipation but that upon returning home, [name] refused to follow through on the plan. In addition, Mrs. Charissa Bash reported that starting yesterday [name] has been complaining of "hearing music in his mouth all the time." She says he said it has been going on for a few days and "its driving him crazy". Mrs. Charissa Bash said he spoke with the dentist about it. Today, [name] has been quite "frustrated and angry" throughout the day "mostly about the music he keeps hearing that no none else can hear." Mrs. Charissa Bash denies that [name] has been aggressive physically or has displayed any behavior that makes her concerned about his physical safety or that of those around him (wife, other caregivers). I advised that if she becomes concerned at any time about [name] behavior or if she feels he may harm himself or someone else, intentionally or unintentionally, she is to call 911.  I spoke on my last visit with Mrs. Rebstock about hiring round the clock care. She says they are able to afford this but she want' sure if it was necessary yet. We discussed today that it might wise for her to investigate whom she might be able to hire for full time care and she agreed saying she already had ideas.   Plan: I notified Dr. Dennard Schaumann of the information as outlined above. I will call Mrs. Zabriskie this afternoon (she naps from 1:30-3:00) to discuss these issues further.    [facility]  [phone]

## 2016-12-20 NOTE — Telephone Encounter (Signed)
Called and spoke to Brandon - pts caregiver- and she states that pt is having some problems with fluid retention. His weight on 12/14/16 was 203lbs and yesterday it was 208.5lbs and his calves are really swollen. She states that his fluid pill was dc'd and is wondering if that should be started back. Also he is now hearing music in his head along with the hallucinations and is wondering if there is something else she can do for that as well???

## 2016-12-20 NOTE — Telephone Encounter (Signed)
Dustin Blankenship is calling again today for this as it was not addressed yesterday.  She states that he really needs this.

## 2016-12-23 ENCOUNTER — Other Ambulatory Visit: Payer: Self-pay | Admitting: *Deleted

## 2016-12-23 NOTE — Patient Outreach (Signed)
[facility]) Care Management   12/23/2016  Dustin Blankenship 19-Nov-1920 [id number]  Dustin Blankenship is an 83 y.o. male Dustin Blankenship an 83 y.o.malewithhistory of COPD, osteoarthritis, UTIs, afib with implanted St. Jude dual chamber RF device implanted in October 2011, coronary artery disease, and dysphagia.   Dustin Blankenship lives at home in [place] with his wife Dustin Blankenship and has 24/7 private duty paid caregivers. His primary caregiver who is very knowledgeableabout Dustin Blankenship needs and has permission by Dustin Blankenship and his wife to share and receive information related to his needs is Gilda Blankenship [phone]. Dustin Blankenship has a daughter who lives in [name] and visits a few times a year. She keeps in touch by phone and is supportive.   Recently, Dustin Blankenship has been having confusion and generally worsening cognitive symptoms. He is scheduled to see Dustin Blankenship in the office on  12/28/15 @ 10:15pm.   Subjective: "She's not being clear. She keeps talking with her head back. I can only hear her if she's up."   Objective: BP 120/78   Pulse 71   SpO2 91%    Review of Systems  Constitutional: Negative.   HENT: Positive for hearing loss.        Mild bilateral hearing loss; asked speaker several times today during conversation to come closer  Eyes: Negative.   Respiratory: Negative for cough, sputum production, shortness of breath and wheezing.   Cardiovascular: Negative for chest pain and leg swelling.       CNA reported leg swelling on Friday; none today after additional 1 dose lasix 56m yesterday  Gastrointestinal: Negative for abdominal pain, constipation, diarrhea, nausea and vomiting.  Genitourinary: Negative.   Musculoskeletal: Negative for falls.  Skin: Negative.   Neurological: Negative for dizziness, sensory change, speech change, focal weakness, loss of consciousness and headaches.  Psychiatric/Behavioral: Positive for memory loss. The patient has insomnia.      Physical Exam  Constitutional: He appears well-developed and well-nourished. He is active. He does not have a sickly appearance. He does not appear ill. No distress.  Cardiovascular: Normal rate and regular rhythm.   Respiratory: Effort normal and breath sounds normal. He has no wheezes. He has no rhonchi. He has no rales.  GI: Soft. Bowel sounds are normal. He exhibits no distension.  Neurological: He is alert.  Skin: Skin is warm, dry and intact.  Psychiatric: His speech is normal. He is not agitated, not aggressive and not combative. Thought content is paranoid. Cognition and memory are impaired. He expresses inappropriate judgment. He exhibits abnormal recent memory and abnormal remote memory.  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    Encounter Medications:   Outpatient Encounter Prescriptions as of 12/23/2016  Medication Sig Note  . Acetaminophen (TYLENOL ARTHRITIS PAIN PO) Take 650 mg by mouth 2 (two) times daily as needed.    .Marland Kitchenalfuzosin (UROXATRAL) 10 MG 24 hr tablet  11/26/2016: Received from: External Pharmacy  . allopurinol (ZYLOPRIM) 300 MG tablet Take 300 mg by mouth daily.   .Marland Kitchenaspirin EC 81 MG tablet Take 81 mg by mouth daily.   . Carboxymethylcellulose Sodium 0.25 % SOLN Apply 1 drop to eye 4 (four) times daily as needed. 08/29/2016: Thera Tears  . COMBIGAN 0.2-0.5 % ophthalmic  solution 1 drop every 12 (twelve) hours. Left Eye   . docusate sodium (COLACE) 100 MG capsule Take 100 mg by mouth 2 (two) times daily as needed for mild constipation.   Marland Kitchen donepezil (ARICEPT) 10 MG tablet Take 10 mg by mouth at bedtime.  11/26/2016: Received from: External Pharmacy  . doxycycline (VIBRAMYCIN) 100 MG capsule Take 100 mg by mouth 2 (two) times daily.   Marland Kitchen escitalopram (LEXAPRO) 10 MG tablet Take 10 mg  by mouth at bedtime.    . furosemide (LASIX) 20 MG tablet Take 1 tablet (20 mg total) by mouth daily.   Marland Kitchen ipratropium (ATROVENT) 0.06 % nasal spray Place 2 sprays into both nostrils 4 (four) times daily.    Marland Kitchen levothyroxine (SYNTHROID, LEVOTHROID) 125 MCG tablet Take 125 mcg by mouth daily.   Marland Kitchen LORazepam (ATIVAN) 1 MG tablet TAKE ONE TABLET BY MOUTH TWICE DAILY AS NEEDED.   Marland Kitchen nitroGLYCERIN (NITROSTAT) 0.4 MG SL tablet Place 0.4 mg under the tongue every 5 (five) minutes as needed. Chest Pain 12/15/2015: On hand; new bottle obtained December  . OXYGEN Inhale 2 L into the lungs continuous. [facility]   . pilocarpine (PILOCAR) 1 % ophthalmic solution Place 1 drop into both eyes 3 (three) times daily.    Dustin Blankenship Glycol-Propyl Glycol (SYSTANE) 0.4-0.3 % GEL Apply 1-2 drops to eye 4 (four) times daily as needed. Per wife sometimes he may use 3 drops in the right eye.    . polyethylene glycol (MIRALAX / GLYCOLAX) packet Take 17 g by mouth daily as needed.    . saw palmetto 500 MG capsule Take 500 mg by mouth 2 (two) times daily.     Assessment:    Doctor, hospital - Dustin Blankenship has dementia and recently began displaying behavior changes, intermittent paranoia (relating stories of being kidnapped and held in a church basement), worsening memory, worsening insomnia (wife reports he sleeps no more than approximately 4 hours at night; naps intermittently through the day), and auditory hallucinations (hearing "music in his mouth").   Today, Dustin Blankenship is in an almost jovial mood. 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 He says the "music is still in my mouth when everything else is quiet" but says now "same gospel song with a quartet of 4 baritones is singing the same song over and over and I don't know the name of it".   Clearly, Dustin Blankenship care needs have increased. His wife is  essentially wheelchair bound and has significantly limiting health conditions which preclude her from providing hands on care. Mr. Nabers has daily CNA care but the care has not been round the clock. His wife is working on a revised plan to provide 24/7 caregiver support, especially as she is concerned about his behavioral changes and worsening insomnia.  I explained to Mr. Heisler wife and caregiver the procedure, provided phone numbers, and instructed them to contact emergency services if at any point they felt Mr. Sakai exhibited any behavioral changes that might put him or others in harm's way. They both verbalized understanding and said that at this point they didn't feel he is a danger to himself or others.   New Health Condition (swelling of ankles) - on Friday, [name]' caregiver/CNA Gilda Blankenship noted that Mr. Gurney had swelling of both ankles. She knew Mr. Ramnauth had previously been on Lasix  but it had been discontinued. She contacted Dr. Samella Parr office and he resumed Lasix 20m po QD.   Today, Mr. MAchillehas NO SWELLING whatsoever in the lower extremities or elsewhere.    Plan:   I will update Dr. PDennard Schaumannregarding Mr. MStuckywife and caregivers report of symptoms.   Mrs. Zajkowski/caregivers will report any new or worsened symptoms to Dr. PDennard Schaumannand will call 911 if Mr. MArrazoladisplays any behaviors that they feel present any hazard to him or others.   I will follow up on Mr. MVivancocondition/status by phone next week.    ANicholsManagement  [phone]

## 2016-12-24 ENCOUNTER — Telehealth: Payer: Self-pay | Admitting: Family Medicine

## 2016-12-24 ENCOUNTER — Other Ambulatory Visit: Payer: Self-pay | Admitting: *Deleted

## 2016-12-24 ENCOUNTER — Ambulatory Visit: Payer: Medicare Other | Admitting: Family Medicine

## 2016-12-24 MED ORDER — RISPERIDONE 0.5 MG PO TABS: 0.5000 mg | ORAL_TABLET | Freq: Two times a day (BID) | ORAL | 0 refills | Status: DC

## 2016-12-24 NOTE — Telephone Encounter (Signed)
Per Dr Dennard Schaumann, order Risperidone 0.5 mg BID.  I called wife, pt seems to have calmed down some.  New caregiver has arrived.  Explained about new medication.  Keep appt here on Friday.

## 2016-12-24 NOTE — Telephone Encounter (Signed)
Pt being followed by [facility] nurse.  Seen by her yesterday.  Pt was pleasant at that time but wife expressed declining confusion and cognitive function.  Today he his hostile and verbally abusive.  Caregiver has walked out of the home.  Wife in tears,  [facility] nurse feels he is on verge of becoming Psychotic.  Has not become physical but wife fearful.  [facility] nurse told her to call 911 if need.    Calling to make you aware and get your advise if any.

## 2016-12-24 NOTE — Patient Outreach (Signed)
[facility]) Care Management  12/24/2016  Dustin Blankenship 1920/09/07 [id number]  I received a call from [name] and from [name] (primary CNA/primary caregiver) regarding [name] this morning. [name] and Ms. Charissa Bash report that [name] has been agitated this morning and has been "unreasonable" and uncharacteristically verbally "hostile", making accusations against his caregivers regarding their wanting to "take control".   I reached out to Dr. Samella Parr office and spoke with Tillman Abide LPN who updated Dr. Dennard Schaumann with the information as outlined above. Dr. Dennard Schaumann ordered Risperidone 0.10m po BID to be started today. Ms. PMirna Miresarranged for the prescription to be delivered to the home.   I called Mrs. Willingham to follow up but was unable to reach her or leave a message. I spoke with CGilda Creaseto update her on Dr. PSamella Parrrecommendations as per Mrs. Soter request when we spoke this morning.   Plan: I will try to reach Mrs. MDuszaagain to check on [name] later than tomorrow.    AGuthrieManagement  [phone]

## 2016-12-25 ENCOUNTER — Other Ambulatory Visit: Payer: Self-pay | Admitting: *Deleted

## 2016-12-25 NOTE — Patient Outreach (Signed)
[facility]) Care Management  12/25/2016  Dustin Blankenship 12/30/1919 [id number]  I reached out to MRS. Ferrara today to follow up on [name] cognitive and behavioral changes and initiation of Risperidone 0.2m po BID prescribed by Dr. PDennard Schaumannstarting yesterday.   Mrs. MKnoblockreports that Mr. MSabois "calmer and more reasonable" today, that he slept better per his report and longer per her report. She gave Mr. MTukeshis first dose of Risperidone last evening before bed and said he awakened "quite dizzy" this morning. Because she was afraid a morning dose would make his dizziness worse, she held it and said she plans to only give it to [name] in the evening. She would like to discuss this further with Dr. PDennard Schaumannat [name] scheduled appointment on Friday.   Plan: I will forward details in the note to Dr. PDennard Schaumann Mrs. MNettletonis to call the office for any new or worsening symptoms.    ASilver CreekManagement  [phone]

## 2016-12-27 ENCOUNTER — Ambulatory Visit (INDEPENDENT_AMBULATORY_CARE_PROVIDER_SITE_OTHER): Payer: Medicare Other | Admitting: Family Medicine

## 2016-12-27 ENCOUNTER — Encounter: Payer: Self-pay | Admitting: Family Medicine

## 2016-12-27 VITALS — BP 98/62 | HR 78 | Temp 98.4°F | Ht 71.0 in | Wt 210.0 lb

## 2016-12-27 DIAGNOSIS — F05 Delirium due to known physiological condition: Secondary | ICD-10-CM

## 2016-12-27 NOTE — Progress Notes (Signed)
Subjective:    Patient ID: Dustin Blankenship, male    DOB: Jan 27, 1920, 83 y.o.   MRN: [id number]  HPI  Please see his chart, We received communication regarding the patient's worsening confusion primarily sundowning every evening. Therefore I recommended trying Risperdal 0.5 mg by mouth twice a day on a standing basis to try to help lessen the effects of sundowning and delirium secondary to his dementia.  He is here today with his primary caregiver who says with him during the daytime. She states that his behavior is not that bad. She never has a problem with him. She thinks she just needs Xanax at night. I reviewed the chart with her particular some of the phone messages we've received within the last week regarding his violent and hostile behavior and 1 sitter leaving the home because of his behavior and his wife's concerns. This apparently called the sitter off guard. She did not realize the situation was that bad. There is no one else here either to cooperate the story or to refute it. Based on his interaction with me he does become agitated quite easily. He is constantly saying I don't need any help and arguing with me and becoming very agitated when I try to discuss was going on. Therefore I tend to believe that he probably does become delirious at times. However it is possible that the Risperdal may have been too sedating for him  Past Medical History:  Diagnosis Date  . Atrial fib/flutter, transient    not on warfarin due to risk of falls ,on aspirin for prophylaxis  . CHF (congestive heart failure) (HCC)    right heart failure and pulm htn due to chronic hypoxia from COPD  . COPD (chronic obstructive pulmonary disease) ([place])   . GERD (gastroesophageal reflux disease)   . Glaucoma   . Glaucoma   . Gout   . Hypothyroid   . Pacemaker 09/20/2010    ST Jude Accent DR RF device  MODEL #[id number],SERIAL #[id number]  . Pulmonary HTN    DUE TO CHRONIC HYPOXIA   FROM COPD  . Sinus node dysfunction ([place])     Dual-chamber permanent pacermaker ST JUDE   Past Surgical History:  Procedure Laterality Date  . COLONOSCOPY W/ POLYPECTOMY    . DOPPLER ECHOCARDIOGRAPHY  07/25/2011   EF =50-55%  . ESOPHAGEAL DILATION N/A 02/24/2015   Procedure: ESOPHAGEAL DILATION;  Surgeon: [name], MD;  Location: [facility];  Service: Endoscopy;  Laterality: N/A;  . ESOPHAGOGASTRODUODENOSCOPY N/A 02/24/2015   Procedure: ESOPHAGOGASTRODUODENOSCOPY (EGD);  Surgeon: [name], MD;  Location: [facility] ENDO SUITE;  Service: Endoscopy;  Laterality: N/A;  10:00  . INSERT / REPLACE / REMOVE PACEMAKER  09/20/2010   ST JUDE ACCENT-dual chamber ;    . LUNG BIOPSY    . NM MYOCAR PERF WALL MOTION  07/25/2011   Current Outpatient Prescriptions on File Prior to Visit  Medication Sig Dispense Refill  . Acetaminophen (TYLENOL ARTHRITIS PAIN PO) Take 650 mg by mouth 2 (two) times daily as needed.     Marland Kitchen alfuzosin (UROXATRAL) 10 MG 24 hr tablet     . allopurinol (ZYLOPRIM) 300 MG tablet Take 300 mg by mouth daily.    Marland Kitchen aspirin EC 81 MG tablet Take 81 mg by mouth daily.    . Carboxymethylcellulose Sodium 0.25 % SOLN Apply 1 drop to eye 4 (four) times daily as needed.    . COMBIGAN 0.2-0.5 % ophthalmic solution 1 drop every 12 (twelve)  hours. Left Eye    . docusate sodium (COLACE) 100 MG capsule Take 100 mg by mouth 2 (two) times daily as needed for mild constipation.    Marland Kitchen donepezil (ARICEPT) 10 MG tablet Take 10 mg by mouth at bedtime.     Marland Kitchen doxycycline (VIBRAMYCIN) 100 MG capsule Take 100 mg by mouth 2 (two) times daily.    Marland Kitchen escitalopram (LEXAPRO) 10 MG tablet Take 10 mg by mouth at bedtime.     . furosemide (LASIX) 20 MG tablet Take 1 tablet (20 mg total) by mouth daily. 30 tablet 3  . ipratropium (ATROVENT) 0.06 % nasal spray Place 2 sprays into both nostrils 4 (four) times daily.     Marland Kitchen levothyroxine (SYNTHROID, LEVOTHROID) 125 MCG tablet Take 125 mcg by mouth daily.    Marland Kitchen LORazepam (ATIVAN) 1 MG tablet TAKE ONE TABLET  BY MOUTH TWICE DAILY AS NEEDED. 60 tablet 2  . nitroGLYCERIN (NITROSTAT) 0.4 MG SL tablet Place 0.4 mg under the tongue every 5 (five) minutes as needed. Chest Pain    . OXYGEN Inhale 2 L into the lungs continuous. [facility]    . pilocarpine (PILOCAR) 1 % ophthalmic solution Place 1 drop into both eyes 3 (three) times daily.     [name] Glycol-Propyl Glycol (SYSTANE) 0.4-0.3 % GEL Apply 1-2 drops to eye 4 (four) times daily as needed. Per wife sometimes he may use 3 drops in the right eye.     . polyethylene glycol (MIRALAX / GLYCOLAX) packet Take 17 g by mouth daily as needed.     . risperiDONE (RISPERDAL) 0.5 MG tablet Take 1 tablet (0.5 mg total) by mouth 2 (two) times daily. 60 tablet 0  . saw palmetto 500 MG capsule Take 500 mg by mouth 2 (two) times daily.      No current facility-administered medications on file prior to visit.    Allergies  Allergen Reactions  . Morphine And Related   . Multaq [Dronedarone]   . Penicillins Other (See Comments)    Unknown. Patient received zosyn this am at 0415(10/24/15) without any problems  . Latex Rash  . Sulfonamide Derivatives Rash  . Testosterone Rash    The cream form causes a rash when rubbed on skin.    Social History   Social History  . Marital status: Married    Spouse name: Michiel Cowboy  . Number of children: 3  . Years of education: College   Occupational History  . Retired Retired   Social History Main Topics  . Smoking status: Former Smoker    Packs/day: 3.00    Years: 18.00    Types: Cigarettes    Start date: 02/17/1952    Quit date: 12/16/1953  . Smokeless tobacco: Never Used  . Alcohol use No  . Drug use: No  . Sexual activity: Not on file   Other Topics Concern  . Not on file   Social History Narrative   Patient lives at home with spouse currently.   Caffeine Use: 1 cup of coffee in a.m and black tea in the afternoon   No family history on file.   Review of Systems  All other systems reviewed and are  negative.      Objective:   Physical Exam  Constitutional: He appears well-developed and well-nourished.  Neck: Neck supple. No JVD present.  Cardiovascular: Normal rate, regular rhythm and normal heart sounds.   Pulmonary/Chest: Effort normal and breath sounds normal. No respiratory distress. He has no wheezes. He has  no rales.  Abdominal: Soft. Bowel sounds are normal. He exhibits no distension. There is no tenderness. There is no rebound and no guarding.  Musculoskeletal: He exhibits no edema.  Lymphadenopathy:    He has no cervical adenopathy.  Neurological: He is alert. No cranial nerve deficit. He exhibits normal muscle tone. Coordination normal.  Skin: No rash noted.          Assessment & Plan:  Dementia with behavioral disturbances. Decrease Risperdal to 0.25 mg by mouth daily at bedtime to combat sundowning. Using 0.25 mg during the day only as needed for agitation. Recheck in a week to see how this is affecting his behavior

## 2016-12-30 ENCOUNTER — Other Ambulatory Visit: Payer: Self-pay | Admitting: *Deleted

## 2016-12-30 NOTE — Patient Outreach (Signed)
[facility]) Care Management  12/30/2016  NORTH ESTERLINE 1920/11/14 [id number]  Dustin Blankenship is an 83 y.o. male Dustin Blankenship an 83 y.o.malewithhistory of COPD, osteoarthritis, UTIs, afib with implanted St. Jude dual chamber RF device implanted in October 2011, coronary artery disease, and dysphagia.   [name] lives at home in [place] with his wife Dustin Blankenship and has 24/7 private duty paid caregivers. His primary caregiver who is very knowledgeableabout [name] needs and has permission by Mr. Eberwein and his wife to share and receive information related to his needs is Dustin Blankenship [phone].   Recently, [name] has been having cognitive and behavioral changes including confusion, paranoia, and verbal aggression. Dr. Dennard Schaumann started Mr. Train on Risperdal .48m po BID. His wife felt it made him a little dizzy and only gave it once daily.   [name] Dr. PDennard Schaumannin the office on Friday for follow up. I reviewed with Mrs. MDoyle Askewand caregiver CGilda CreaseDr. PSamella Parrchanges/recommendations:   1. Decrease Risperdal to 0.25 mg by mouth daily at bedtime to combat sundowning 2. Use Risperdal 0.25 mg during the day only as needed for agitation. 3. Recheck in a week to see how this is affecting his behavior  Mrs. MHyamsstates she feels [name] doing better on the .245mdose once daily.   Plan: I will reach out to Mrs. MyMatichgain by phone  to check on [name]. Mrs. MyDelisleill reach out to Dr. PiSamella Parrffice on behalf of [name] she is concerned about any new or worsening symptoms.    AlDrysdaleanagement  ([phone]

## 2017-01-24 ENCOUNTER — Other Ambulatory Visit: Payer: Self-pay | Admitting: *Deleted

## 2017-01-24 NOTE — Patient Outreach (Signed)
[facility]) Care Management  01/24/2017  KEYSEAN SAVINO 1920-10-19 [id number]  I reached out to the [place] home today to follow up on Mr. Wandell progress. Mrs. Orrison answered and told me that Mr. Amero had a "very bad" day yesterday, exhibiting confusion and paranoia, saying his caregivers were trying to poison him when he had an episode of diarrhea. She states that Mr. Glasheen "isn't completely with it" any day. His daughter has rented an apartment in [place] for the sole purpose of helping Mr. & Mrs. Mcpartland with healthcare concerns and needs. She maintains a residence in [name]. Mr. & Mrs. Plush are keeping their caregiver staff.   Plan:I will see Mr. & Mrs. Legler at home next week on Thursday to review progress and discuss level of care concerns and needs.    [place] Management  [phone]

## 2017-01-28 DIAGNOSIS — H401132 Primary open-angle glaucoma, bilateral, moderate stage: Secondary | ICD-10-CM | POA: Diagnosis not present

## 2017-01-28 DIAGNOSIS — H1859 Other hereditary corneal dystrophies: Secondary | ICD-10-CM | POA: Diagnosis not present

## 2017-01-30 ENCOUNTER — Other Ambulatory Visit: Payer: Self-pay | Admitting: *Deleted

## 2017-01-30 NOTE — Patient Outreach (Signed)
Dustin Blankenship) Care Management   01/30/2017  BIRDIE BEVERIDGE [birth year] [id number]  Dustin Blankenship is an 83 y.o. male withhistory of COPD, osteoarthritis, UTIs, afib with implanted [employer] dual chamber RF device implanted in October 2011, coronary artery disease, and dysphagia.   Dustin Blankenship lives at home in [place] with his wife Dustin Blankenship and has 24/7 private duty paid caregivers. His primary caregiver who is very knowledgeableabout Dustin Blankenship needs and has permission by Mr. [name] and his wife to share and receive information related to his needs is Dustin Blankenship [phone].   Recently, Dustin Blankenship has been having cognitive and behavioral changes including confusion, paranoia, and verbal aggression. During my visit today, Dustin Blankenship was cooperative and engaged. He has excellent long term memory but struggles with short term memory. His conversation is often repetitive.   Subjective: "I think I'm doing great for 83 years old!"  Objective:  BP 120/76 (BP Location: Right Arm, Patient Position: Sitting, Cuff Size: Normal)   Pulse 70   SpO2 94%   Review of Systems  Constitutional: Negative.   HENT: Positive for hearing loss.        Mild bilateral hearing loss  Eyes:       Limited vision right eye  Respiratory: Positive for cough and sputum production. Negative for shortness of breath and wheezing.        Patient and spouse report chronic cough with production of white sputum in the morning  Cardiovascular: Positive for leg swelling.       1+ bilateral lower extremity edema to ankles  Gastrointestinal: Negative.   Genitourinary: Negative.        Wears depends for urinary incontinence  Musculoskeletal: Positive for myalgias. Negative for falls.  Skin: Negative.   Neurological: Negative.   Psychiatric/Behavioral: Positive for memory loss. The patient has insomnia.        Interrupted sleep pattern; patient and spouse report patient rising in the middle of the night most  every day    Physical Exam  Constitutional: Vital signs are normal. He appears well-developed and well-nourished. He is active. He has a sickly appearance. He does not appear ill.  Cardiovascular: Normal rate and regular rhythm.   Respiratory: Effort normal and breath sounds normal. He has no wheezes. He has no rhonchi. He has no rales.  GI: Soft. Bowel sounds are normal.  Neurological: He is alert.  Skin: Skin is warm, dry and intact.  Psychiatric: He has a normal mood and affect. His speech is normal and behavior is normal. Thought content is paranoid. He expresses impulsivity. He exhibits abnormal recent memory.  Per report of spouse and caregiver, patient thought content often paranoid; short term memory is impaired; patient demonstrates impulsivity during conversation     Encounter Medications:   Outpatient Encounter Prescriptions as of 01/30/2017  Medication Sig Note  . Acetaminophen (TYLENOL ARTHRITIS PAIN PO) Take 650 mg by mouth 2 (two) times daily as needed.    Marland Kitchen alfuzosin (UROXATRAL) 10 MG 24 hr tablet  11/26/2016: Received from: External Pharmacy  . allopurinol (ZYLOPRIM) 300 MG tablet Take 300 mg by mouth daily.   Marland Kitchen aspirin EC 81 MG tablet Take 81 mg by mouth daily.   . Carboxymethylcellulose Sodium 0.25 % SOLN Apply 1 drop to eye 4 (four) times daily as needed. 08/29/2016: Thera Tears  . COMBIGAN 0.2-0.5 % ophthalmic solution 1 drop every 12 (twelve) hours. Left Eye   . docusate sodium (COLACE) 100 MG capsule Take 100  mg by mouth 2 (two) times daily as needed for mild constipation.   Marland Kitchen donepezil (ARICEPT) 10 MG tablet Take 10 mg by mouth at bedtime.  11/26/2016: Received from: External Pharmacy  . doxycycline (VIBRAMYCIN) 100 MG capsule Take 100 mg by mouth 2 (two) times daily.   Marland Kitchen escitalopram (LEXAPRO) 10 MG tablet Take 10 mg by mouth at bedtime.    . furosemide (LASIX) 20 MG tablet Take 1 tablet (20 mg total) by mouth daily.   Marland Kitchen ipratropium (ATROVENT) 0.06 % nasal spray  Place 2 sprays into both nostrils 4 (four) times daily.    Marland Kitchen levothyroxine (SYNTHROID, LEVOTHROID) 125 MCG tablet Take 125 mcg by mouth daily.   Marland Kitchen LORazepam (ATIVAN) 1 MG tablet TAKE ONE TABLET BY MOUTH TWICE DAILY AS NEEDED.   Marland Kitchen nitroGLYCERIN (NITROSTAT) 0.4 MG SL tablet Place 0.4 mg under the tongue every 5 (five) minutes as needed. Chest Pain 12/15/2015: On hand; new bottle obtained December  . OXYGEN Inhale 2 L into the lungs continuous. [facility]   . pilocarpine (PILOCAR) 1 % ophthalmic solution Place 1 drop into both eyes 3 (three) times daily.    [name] Glycol-Propyl Glycol (SYSTANE) 0.4-0.3 % GEL Apply 1-2 drops to eye 4 (four) times daily as needed. Per wife sometimes he may use 3 drops in the right eye.    . polyethylene glycol (MIRALAX / GLYCOLAX) packet Take 17 g by mouth daily as needed.    . risperiDONE (RISPERDAL) 0.5 MG tablet Take 1 tablet (0.5 mg total) by mouth 2 (two) times daily. 12/30/2016: Taking .21m once daily at night and .274mprn during the day  . saw palmetto 500 MG capsule Take 500 mg by mouth 2 (two) times daily.     Assessment:  [facility], hospital Dustin Blankenship dementia and recently began displaying behavior changes, intermittent paranoia, worsening memory, worsening insomnia, and intermittent auditory hallucinations.   Dustin Blankenship an episode of paranoia last week when he had diarrhea and though that it was because his caregiver was trying to poison him. He made no mention of any of this today when I saw him, even when I questioned him about his GI/GU habits.   Dustin Blankenship talkative and engaging today. He was in a pleasant mood. He repeated himself frequently and had difficulty maintaining focus on the topic at hand.   Mrs. Blankenship that he has "had a few good days". Dustin Blankenship the benefit of a very committed private duty staff. His primary caregiver is Dustin Blankenship been instrumental in helping Mr. MyGladumaintain some level of calm. She keeps his medications locked in a cabinet (at the request of Mrs. MyDoyle Askewand sees that Mr. MyEdginets his medications on time every day. Mrs. BaCharissa Bashransports Mr. MyAllegrettoo doctor appointments and helps him with management of phone calls. She also prepares meals for Mr. MyCastillend his wife.   At this time, Mrs. MyKnerrishes for she and Mr. MyFrischo remain at home with 24/7 in home private duty caregivers but says she understands that it is likely at some point in the near future, Mr. MyBaranekay have to move to a higher level of care.   Mr. MyFoisyaughter has moved to the area to provide support to her father.   New Health Condition (swelling of ankles) -  Mr. MyMoellerhas had intermittent bilateral ankle swelling over the last few months; His caregiver/CNA Dustin Creaseoted that  Mr. Frampton had previously been on Lasix but it had been discontinued. She contacted Dr. Samella Parr office and he resumed Lasix 12m po QD. Mr. MLagaceis taking his Lasix daily as prescribed. Today, he has mild, 1+ edema of bilateral ankles. He is not short of breath and does not have abdominal swelling; his breath sounds are clear.   Plan: Per my conversation with Mrs. MMyrla Halsted Mr. MFristoedaughter may want to meet with me at some time in the near future to discuss level of care needs/ concerns. CHoyle Saueror Mrs. MAmoswill contact me about this when Mr. MEschmanndaughter requests.   I will reach out to Mr. MEpperlyvia his wife or caregiver over the next few weeks and will see him at home in a month unless his daughter wishes to meet sooner or he needs other attention.   ACassandraManagement  [phone].

## 2017-02-07 ENCOUNTER — Other Ambulatory Visit: Payer: Self-pay | Admitting: *Deleted

## 2017-02-07 NOTE — Patient Outreach (Signed)
[place] [facility]) Care Management  02/07/2017  [name] 08-11-1920 [id number]  Call received yesterday from Dustin Blankenship, primary caregiver for [name]. Ms. Dustin Blankenship called to report that she noted hypersomnolence over the prior 24 hours in Dustin Blankenship. She began to inquire with the other private duty caregivers, some of whom are not CNA's but are sitters. One of the caregivers, whom Ms. Dustin Blankenship did not name told Dustin Blankenship that she (the Dustin Blankenship) had given Dustin Blankenship 57m two nights in a row (Tuesday, Wednesday) with Mrs. Dustin Askewpermission. The Dustin Blankenship told Dustin Blankenship might help [name] sleep and Mrs. Dustin Askewgave permission for [name] to have the Blankenship. Mrs. MPatroneconfirmed that she gave permission to the Dustin Blankenship to give the Blankenship to [name]   As of yesterday late afternoon, Dustin Sauerstated that Mr. MAllbaughwas aware of his surroundings, knew who Mrs. MPatewas, who Dustin Sauerand the other caregivers were, and realized that he was quite sleepy. Dustin Sauersaid Mr. MEckardwould wake to eat his meals but wanted to sleep throughout the day Wednesday and Thursday.   I advised that Dustin Sauertell the other caregivers that Mr. MDonadois not to be given any medication nor is any medication to be stopped without an order directly from the physician. I also will report this new information to Dustin Blankenship   Mrs. Dustin Askewand Dustin Saueradvised that they would call Dr. PDennard Schaumannor me if [name] not show improvement or if his somnolence worsened.   Plan: I will follow up with [name] his wife, and Ms. BCharissa Bashon Monday.    AHendersonManagement  [phone]

## 2017-02-10 ENCOUNTER — Other Ambulatory Visit: Payer: Self-pay | Admitting: *Deleted

## 2017-02-10 NOTE — Patient Outreach (Signed)
[facility]) Care Management  02/10/2017  LYDELL MOGA 1920-10-05 [id number]  I reached out to the home of Mr. Haughey today to check on his progress and mental status. Last week I received a call from his wife and caregiver (CNA) who expressed concern because his wife had approved of a caregiver giving him Melatonin as a sleep aide, 2 nights in a row. Mr. Alligood displayed hypersomnolence and confusion.   Today, Mrs. Faro reports "he's complaining about the finances so I guess he's back to his old self." She states that Mr. Buttram is still sleeping more than usual but is certainly "clearer" and "back to getting around like he usually does."   I reviewed again the importance of maintaining strict adherence to the medication prescribed by the provider(s) ONLY and to never add or discontinue any medication (prescription or over the counter) without an order from the physician.   Plan: I will follow up with Mr. Cabiness at the appointment we have scheduled for March. Mr. Wailes will only take medications prescribed for him. One caregiver will assist Mrs. Cermak with medication management.    [facility]  [phone]

## 2017-02-18 ENCOUNTER — Encounter (INDEPENDENT_AMBULATORY_CARE_PROVIDER_SITE_OTHER): Payer: Self-pay | Admitting: Internal Medicine

## 2017-02-18 ENCOUNTER — Ambulatory Visit (INDEPENDENT_AMBULATORY_CARE_PROVIDER_SITE_OTHER): Payer: Medicare Other | Admitting: Internal Medicine

## 2017-02-18 VITALS — BP 130/90 | HR 68 | Temp 97.0°F | Resp 18 | Ht 69.7 in | Wt 206.2 lb

## 2017-02-18 DIAGNOSIS — R1319 Other dysphagia: Secondary | ICD-10-CM

## 2017-02-18 DIAGNOSIS — R1310 Dysphagia, unspecified: Secondary | ICD-10-CM

## 2017-02-18 DIAGNOSIS — K224 Dyskinesia of esophagus: Secondary | ICD-10-CM

## 2017-02-18 MED ORDER — LORAZEPAM 1 MG PO TABS: 1.0000 mg | ORAL_TABLET | Freq: Two times a day (BID) | ORAL | 2 refills | Status: AC

## 2017-02-18 NOTE — Progress Notes (Signed)
Presenting complaint;  Follow-up for esophageal motility disorder.  Subjective:  Patient is 83 year old Caucasian male who is here for scheduled visit. He was last seen on 08/20/2016. He feels swallowing difficulty has not gotten any worse. He eats slowly and chews his food well. He has no difficulty with liquids. He also has no difficulty with soft foods. He is unable to swallow meat. He generally will chew it for a while and then spit it out. He has no difficulty with fish or chicken. He denies heartburn regurgitation or coughing spells. He says his bowels move every other day. He takes MiraLAX 3-4 times week. He also complains of insomnia and has tinnitus.  He lives at home with his wife and they have round-the-clock help. He reports no side effects with lorazepam.    Current Medications: Outpatient Encounter Prescriptions as of 02/18/2017  Medication Sig  . Acetaminophen (TYLENOL ARTHRITIS PAIN PO) Take 650 mg by mouth 2 (two) times daily as needed.   Marland Kitchen alfuzosin (UROXATRAL) 10 MG 24 hr tablet Take 10 mg by mouth daily with breakfast.   . allopurinol (ZYLOPRIM) 300 MG tablet Take 300 mg by mouth daily.  Marland Kitchen aspirin 325 MG EC tablet Take 325 mg by mouth daily.  . Carboxymethylcellulose Sodium 0.25 % SOLN Apply 1 drop to eye 4 (four) times daily as needed.  . COMBIGAN 0.2-0.5 % ophthalmic solution 1 drop every 12 (twelve) hours. Left Eye  . docusate sodium (COLACE) 100 MG capsule Take 100 mg by mouth 2 (two) times daily as needed for mild constipation.  Marland Kitchen donepezil (ARICEPT) 10 MG tablet Take 10 mg by mouth at bedtime.   Marland Kitchen doxycycline (VIBRAMYCIN) 100 MG capsule Take 100 mg by mouth 2 (two) times daily.  Marland Kitchen escitalopram (LEXAPRO) 10 MG tablet Take 10 mg by mouth at bedtime.   . furosemide (LASIX) 20 MG tablet Take 1 tablet (20 mg total) by mouth daily.  Marland Kitchen ipratropium (ATROVENT) 0.06 % nasal spray Place 2 sprays into both nostrils 4 (four) times daily.   Marland Kitchen levothyroxine (SYNTHROID, LEVOTHROID)  125 MCG tablet Take 125 mcg by mouth daily.  Marland Kitchen LORazepam (ATIVAN) 1 MG tablet TAKE ONE TABLET BY MOUTH TWICE DAILY AS NEEDED.  Marland Kitchen nitroGLYCERIN (NITROSTAT) 0.4 MG SL tablet Place 0.4 mg under the tongue every 5 (five) minutes as needed. Chest Pain  . OXYGEN Inhale 2 L into the lungs continuous. [facility]  . pilocarpine (PILOCAR) 1 % ophthalmic solution Place 1 drop into both eyes 3 (three) times daily.   [name] Glycol-Propyl Glycol (SYSTANE) 0.4-0.3 % GEL Apply 1-2 drops to eye 4 (four) times daily as needed. Per wife sometimes he may use 3 drops in the right eye.   . polyethylene glycol (MIRALAX / GLYCOLAX) packet Take 17 g by mouth daily as needed.   . risperiDONE (RISPERDAL) 0.5 MG tablet Take 1 tablet (0.5 mg total) by mouth 2 (two) times daily.  . saw palmetto 500 MG capsule Take 500 mg by mouth 2 (two) times daily.   . [DISCONTINUED] aspirin EC 81 MG tablet Take 81 mg by mouth daily.   No facility-administered encounter medications on file as of 02/18/2017.      Objective: Blood pressure 130/90, pulse 68, temperature 97 F (36.1 C), temperature source Oral, resp. rate 18, height 5' 9.7" (1.77 m), weight 206 lb 3.2 oz (93.5 kg). Patient is alert and in no acute distress. He is on nasal O2. Conjunctiva is pink. Sclera is nonicteric Right cornea is partially hazy.  Oropharyngeal mucosa is normal. No neck masses or thyromegaly noted. Cardiac exam with regular rhythm normal S1 and S2. No murmur or gallop noted. Lungs are clear to auscultation. Abdomen is examined in sitting position. It is soft and nontender. No masses or organomegaly.  No LE edema or clubbing noted.    Assessment:  #1. Solid food dysphagia secondary to esophageal motility disorder. He has noted significant symptomatic improvement with lorazepam which he is tolerating well. Therefore continued therapy benefit outweighs risk.   Plan:  New prescription given for lorazepam 1 mg by mouth twice a day for 1 month and  2 refills. Patient will need refill in 3 months. Office visit in 6 months.

## 2017-02-18 NOTE — Patient Instructions (Signed)
Please notify if swallowing difficulty worsens.

## 2017-02-25 ENCOUNTER — Ambulatory Visit: Payer: Self-pay | Admitting: Urology

## 2017-03-06 ENCOUNTER — Other Ambulatory Visit: Payer: Self-pay | Admitting: Family Medicine

## 2017-03-06 DIAGNOSIS — B351 Tinea unguium: Secondary | ICD-10-CM | POA: Diagnosis not present

## 2017-03-06 DIAGNOSIS — M79674 Pain in right toe(s): Secondary | ICD-10-CM | POA: Diagnosis not present

## 2017-03-06 DIAGNOSIS — M79675 Pain in left toe(s): Secondary | ICD-10-CM | POA: Diagnosis not present

## 2017-03-07 ENCOUNTER — Other Ambulatory Visit: Payer: Self-pay

## 2017-03-07 MED ORDER — ALFUZOSIN HCL ER 10 MG PO TB24: 10.0000 mg | ORAL_TABLET | Freq: Every day | ORAL | 3 refills | Status: AC

## 2017-03-07 NOTE — Telephone Encounter (Signed)
Refill appropriate

## 2017-03-12 ENCOUNTER — Telehealth: Payer: Self-pay | Admitting: Family Medicine

## 2017-03-12 NOTE — Telephone Encounter (Signed)
[name] (caregiver) calling to ask if pt needs to be on potassium since giving him furosemide? He was drinking Gatorade at first but no refuses to drink it and she just wanted to make sure he was getting enough potassium along with the fluid pill???

## 2017-03-13 ENCOUNTER — Other Ambulatory Visit: Payer: Self-pay | Admitting: *Deleted

## 2017-03-13 NOTE — Telephone Encounter (Signed)
Doesn't appear to be needed given low dose of lasix.  K in 11/2016 was normal.

## 2017-03-13 NOTE — Telephone Encounter (Signed)
Pt is having swelling in legs and stomach area and hh nurse told her to double the Lasik and then check with [place] as to how long to keep it doubled. Per WTP double x 2 days and if no better NTBS - Carolyn aware and verbalized understanding.

## 2017-03-13 NOTE — Patient Outreach (Addendum)
[facility]) Care Management  03/13/2017  Dustin Blankenship 1920-08-18 [id number]  Call received from [name] Dustin Blankenship and their caregiver Dustin Blankenship. [name] asked me to speak with [name] who told me that [name] has gained "a pound a day over the last 3 or 4 days and his feet are starting to swell up." His weight has risen from 202 to 206.4 over the last 3 days. [name] states that [name] is taking his medications including Lasix daily. He is not on a potassium supplement. [name] is using his Oxygen @ 2l/[place] as prescribed. [name] reports that she has noticed [name] exhibiting wheezing when he exerts himself. She says he denies shortness of breath, does not have abdominal swelling, is eating/drinking normally, and denies having had chest pain, shortness of breath, lightheadedness, or dizziness.   Plan: I reached out to Dr. Samella Parr office to notify him of symptoms as outlined above and request any orders or direction for treatment of [name] symptoms.    [facility]  [phone]

## 2017-03-18 ENCOUNTER — Encounter: Payer: Self-pay | Admitting: Family Medicine

## 2017-03-18 ENCOUNTER — Ambulatory Visit (INDEPENDENT_AMBULATORY_CARE_PROVIDER_SITE_OTHER): Payer: Medicare Other | Admitting: Family Medicine

## 2017-03-18 VITALS — BP 110/88 | HR 75 | Temp 97.4°F | Resp 16 | Wt 206.0 lb

## 2017-03-18 DIAGNOSIS — I2781 Cor pulmonale (chronic): Secondary | ICD-10-CM

## 2017-03-18 NOTE — Progress Notes (Signed)
Subjective:    Patient ID: Dustin Blankenship, male    DOB: 09/23/1920, 83 y.o.   MRN: [id number]  HPI  Please see his chart.  Last week, we're see communication regarding swelling in the patient's legs. They felt that the patient was retaining fluid in his legs and in his abdomen. We had the patient double his Lasix for 2 days and then come in for an evaluation. Today he weighs 206 pounds. At his last visit in December he was 212 pounds. He does have trace pretibial pitting edema but it is very mild. On pulmonary exam, his right lung is completely clear. There are some faint left basilar crackles which are more consistent with atelectasis. There is no evidence of JVD. He denies any shortness of breath beyond his baseline. He is on chronic oxygen secondary to COPD. He denies any orthopnea or paroxysmal nocturnal dyspnea. Examination of his feet is significant for violaceous coloring of his toes distal to the MTP joints. However the toes are still warm to the touch. Capillary refill is less than 3 seconds. He has palpable dorsalis pedis and posterior tibialis pulses bilaterally Past Medical History:  Diagnosis Date  . Atrial fib/flutter, transient    not on warfarin due to risk of falls ,on aspirin for prophylaxis  . CHF (congestive heart failure) (HCC)    right heart failure and pulm htn due to chronic hypoxia from COPD  . COPD (chronic obstructive pulmonary disease) ([place])   . GERD (gastroesophageal reflux disease)   . Glaucoma   . Glaucoma   . Gout   . Hypothyroid   . Pacemaker 09/20/2010    ST Jude Accent DR RF device  MODEL #[id number],SERIAL #[id number]  . Pulmonary HTN    DUE TO CHRONIC HYPOXIA   FROM COPD  . Sinus node dysfunction ([place])    Dual-chamber permanent pacermaker ST JUDE   Past Surgical History:  Procedure Laterality Date  . COLONOSCOPY W/ POLYPECTOMY    . DOPPLER ECHOCARDIOGRAPHY  07/25/2011   EF =50-55%  . ESOPHAGEAL DILATION N/A 02/24/2015   Procedure: ESOPHAGEAL DILATION;   Surgeon: [name], MD;  [facility];  Service: Endoscopy;  Laterality: N/A;  . ESOPHAGOGASTRODUODENOSCOPY N/A 02/24/2015   Procedure: ESOPHAGOGASTRODUODENOSCOPY (EGD);  Surgeon: [name], MD;  Location: [facility];  Service: Endoscopy;  Laterality: N/A;  10:00  . INSERT / REPLACE / REMOVE PACEMAKER  09/20/2010   ST JUDE ACCENT-dual chamber ;    . LUNG BIOPSY    . NM MYOCAR PERF WALL MOTION  07/25/2011   Current Outpatient Prescriptions on File Prior to Visit  Medication Sig Dispense Refill  . Acetaminophen (TYLENOL ARTHRITIS PAIN PO) Take 650 mg by mouth 2 (two) times daily as needed.     Marland Kitchen alfuzosin (UROXATRAL) 10 MG 24 hr tablet Take 1 tablet (10 mg total) by mouth daily with breakfast. 30 tablet 3  . allopurinol (ZYLOPRIM) 300 MG tablet Take 300 mg by mouth daily.    Marland Kitchen aspirin 325 MG EC tablet Take 325 mg by mouth daily.    . Carboxymethylcellulose Sodium 0.25 % SOLN Apply 1 drop to eye 4 (four) times daily as needed.    . COMBIGAN 0.2-0.5 % ophthalmic solution 1 drop every 12 (twelve) hours. Left Eye    . docusate sodium (COLACE) 100 MG capsule Take 100 mg by mouth 2 (two) times daily as needed for mild constipation.    Marland Kitchen donepezil (ARICEPT) 10 MG tablet Take 10 mg  by mouth at bedtime.     Marland Kitchen escitalopram (LEXAPRO) 10 MG tablet Take 10 mg by mouth at bedtime.     . furosemide (LASIX) 20 MG tablet Take 1 tablet (20 mg total) by mouth daily. 30 tablet 3  . ipratropium (ATROVENT) 0.06 % nasal spray Place 2 sprays into both nostrils 4 (four) times daily.     Marland Kitchen levothyroxine (SYNTHROID, LEVOTHROID) 125 MCG tablet Take 125 mcg by mouth daily.    Marland Kitchen LORazepam (ATIVAN) 1 MG tablet Take 1 tablet (1 mg total) by mouth 2 (two) times daily. 60 tablet 2  . nitroGLYCERIN (NITROSTAT) 0.4 MG SL tablet Place 0.4 mg under the tongue every 5 (five) minutes as needed. Chest Pain    . OXYGEN Inhale 2 L into the lungs continuous. [employer]    . pilocarpine (PILOCAR) 1 % ophthalmic  solution Place 1 drop into both eyes 3 (three) times daily.     [name] Glycol-Propyl Glycol (SYSTANE) 0.4-0.3 % GEL Apply 1-2 drops to eye 4 (four) times daily as needed. Per wife sometimes he may use 3 drops in the right eye.     . polyethylene glycol (MIRALAX / GLYCOLAX) packet Take 17 g by mouth daily as needed.     . risperiDONE (RISPERDAL) 0.5 MG tablet TAKE (1) TABLET BY MOUTH TWICE DAILY. 60 tablet 3  . saw palmetto 500 MG capsule Take 500 mg by mouth 2 (two) times daily.      No current facility-administered medications on file prior to visit.    Allergies  Allergen Reactions  . Morphine And Related   . Multaq [Dronedarone]   . Penicillins Other (See Comments)    Unknown. Patient received zosyn this am at 0415(10/24/15) without any problems  . Latex Rash  . Sulfonamide Derivatives Rash  . Testosterone Rash    The cream form causes a rash when rubbed on skin.    Social History   Social History  . Marital status: Married    Spouse name: Michiel Cowboy  . Number of children: 3  . Years of education: College   Occupational History  . Retired Retired   Social History Main Topics  . Smoking status: Former Smoker    Packs/day: 3.00    Years: 18.00    Types: Cigarettes    Start date: 02/17/1952    Quit date: 12/16/1953  . Smokeless tobacco: Never Used  . Alcohol use No  . Drug use: No  . Sexual activity: Not on file   Other Topics Concern  . Not on file   Social History Narrative   Patient lives at home with spouse currently.   Caffeine Use: 1 cup of coffee in a.m and black tea in the afternoon   No family history on file.   Review of Systems  All other systems reviewed and are negative.      Objective:   Physical Exam  Constitutional: He appears well-developed and well-nourished.  Neck: Neck supple. No JVD present.  Cardiovascular: Normal rate, regular rhythm and normal heart sounds.   Pulmonary/Chest: Effort normal and breath sounds normal. No respiratory  distress. He has no wheezes. He has no rales.  Abdominal: Soft. Bowel sounds are normal. He exhibits no distension. There is no tenderness. There is no rebound and no guarding.  Musculoskeletal: He exhibits no edema.  Lymphadenopathy:    He has no cervical adenopathy.  Neurological: He is alert. No cranial nerve deficit. He exhibits normal muscle tone. Coordination normal.  Skin: No  rash noted.  trace pretibial edema No JVD See hpi        Assessment & Plan:  Cor pulmonale, chronic (HCC) - Plan: BASIC METABOLIC PANEL WITH GFR  I believe the patient has mild edema secondary to cor pulmonale and diastolic dysfunction coupled with COPD. However the swelling is mild. There is no evidence of fluid overload on exam or anything that would suggest that he needs an increase permanent dose of diuretic. In fact I would be concerned about dehydration and dizziness if we increase his Lasix permanently. Therefore I'll continue the patient on Lasix 20 mg a day. While the patient is here, will repeat a BMP to monitor his renal function as well as his potassium. He does have evidence of peripheral vascular disease on exam but at the present time there is no indication that would require aggressive intervention particular given his age.

## 2017-03-19 LAB — BASIC METABOLIC PANEL WITH GFR
BUN: 21 mg/dL (ref 7–25)
CHLORIDE: 103 mmol/L (ref 98–110)
CO2: 35 mmol/L — AB (ref 20–31)
CREATININE: 0.95 mg/dL (ref 0.70–1.11)
Calcium: 9.1 mg/dL (ref 8.6–10.3)
GFR, Est African American: 78 mL/min (ref >=60)
GFR, Est Non African American: 68 mL/min (ref >=60)
GLUCOSE: 94 mg/dL (ref 70–99)
POTASSIUM: 3.7 mmol/L (ref 3.5–5.3)
Sodium: 144 mmol/L (ref 135–146)

## 2017-03-24 ENCOUNTER — Other Ambulatory Visit: Payer: Self-pay | Admitting: *Deleted

## 2017-03-24 NOTE — Patient Outreach (Signed)
D[place]in Blankenship) Care Management  03/24/2017  Dustin Blankenship 1920-10-03 [id number]  Dustin Blankenship is an 83 y.o. male withhistory of COPD, osteoarthritis, UTIs, afib with implanted St. Jude dual chamber RF device implanted in October 2011, coronary artery disease, and dysphagia.   D[place] Blank[place] lives at home in [place] with his wife [place] and has 24/7 private duty paid caregivers. His primary caregiver who is very knowledgeableabout Dustin Blankenship needs and has permission by Dustin Blankenship and his wife to share and receive information related to his needs is [place]in Blankenship [phone].   Dustin Blankenship has had cognitive and behavioral changes over the last few months including confusion, paranoia, and verbal aggression. During my visit today, Dustin Blankenship was cooperative and engaged. He has excellent long term memory but struggles with short term memory. His conversation is often repetitive.   Most recently, he was noted to have increasing abdominal swelling and peripheral edema. He was seen in the office by Dr. Dennard Schaumann who opted to continue his once daily Lasix 38m dose and had labs performed for BMP which revealed no abnormalities.   I reached out to the home of Mr. MMaultsbytoday and spoke with Mr. MKruzel caregiver [place] Blankenship his request. Dustin Blankenship that Mr. MWaineris doing well and has been "steady". She states that he has not had further episodes of worsening confusion and is taking medications and other treatments as prescribed. She says Mr. MDomangueis eating well and while his sleep pattern is different from several months ago, it is improved from the disturbances he was experiencing several months ago.   Plan: I will see Mr. MThrushat home for a routine home visit on Tuesday 04/01/17.    [facility]  [phone]

## 2017-03-25 ENCOUNTER — Telehealth: Payer: Self-pay | Admitting: Family Medicine

## 2017-03-25 NOTE — Telephone Encounter (Signed)
Calling for lab results.

## 2017-03-25 NOTE — Telephone Encounter (Signed)
LMTCB

## 2017-03-25 NOTE — Telephone Encounter (Signed)
Went over lab results with pt gave him doctor pickard message about levels being normal continue lasix.

## 2017-04-01 ENCOUNTER — Emergency Department (HOSPITAL_COMMUNITY): Payer: Medicare Other

## 2017-04-01 ENCOUNTER — Emergency Department (HOSPITAL_COMMUNITY)
Admission: EM | Admit: 2017-04-01 | Discharge: 2017-04-01 | Disposition: A | Discharge status: Home / Self Care | Payer: Medicare Other | Source: Home / Self Care | Attending: Emergency Medicine | Admitting: Emergency Medicine

## 2017-04-01 ENCOUNTER — Encounter (HOSPITAL_COMMUNITY): Payer: Self-pay | Admitting: Emergency Medicine

## 2017-04-01 ENCOUNTER — Other Ambulatory Visit: Payer: Self-pay | Admitting: *Deleted

## 2017-04-01 DIAGNOSIS — I5032 Chronic diastolic (congestive) heart failure: Secondary | ICD-10-CM | POA: Diagnosis not present

## 2017-04-01 DIAGNOSIS — Z7982 Long term (current) use of aspirin: Secondary | ICD-10-CM | POA: Insufficient documentation

## 2017-04-01 DIAGNOSIS — F0391 Unspecified dementia with behavioral disturbance: Secondary | ICD-10-CM | POA: Diagnosis not present

## 2017-04-01 DIAGNOSIS — I2510 Atherosclerotic heart disease of native coronary artery without angina pectoris: Secondary | ICD-10-CM | POA: Insufficient documentation

## 2017-04-01 DIAGNOSIS — I509 Heart failure, unspecified: Secondary | ICD-10-CM

## 2017-04-01 DIAGNOSIS — Z87891 Personal history of nicotine dependence: Secondary | ICD-10-CM | POA: Insufficient documentation

## 2017-04-01 DIAGNOSIS — Z66 Do not resuscitate: Secondary | ICD-10-CM | POA: Diagnosis not present

## 2017-04-01 DIAGNOSIS — J449 Chronic obstructive pulmonary disease, unspecified: Secondary | ICD-10-CM | POA: Insufficient documentation

## 2017-04-01 DIAGNOSIS — J189 Pneumonia, unspecified organism: Secondary | ICD-10-CM | POA: Diagnosis not present

## 2017-04-01 DIAGNOSIS — J9611 Chronic respiratory failure with hypoxia: Secondary | ICD-10-CM | POA: Diagnosis not present

## 2017-04-01 DIAGNOSIS — Z79899 Other long term (current) drug therapy: Secondary | ICD-10-CM

## 2017-04-01 DIAGNOSIS — K5900 Constipation, unspecified: Secondary | ICD-10-CM

## 2017-04-01 DIAGNOSIS — E039 Hypothyroidism, unspecified: Secondary | ICD-10-CM

## 2017-04-01 DIAGNOSIS — E860 Dehydration: Secondary | ICD-10-CM | POA: Diagnosis not present

## 2017-04-01 DIAGNOSIS — J440 Chronic obstructive pulmonary disease with acute lower respiratory infection: Secondary | ICD-10-CM | POA: Diagnosis not present

## 2017-04-01 DIAGNOSIS — Z950 Presence of cardiac pacemaker: Secondary | ICD-10-CM

## 2017-04-01 DIAGNOSIS — R05 Cough: Secondary | ICD-10-CM | POA: Diagnosis not present

## 2017-04-01 DIAGNOSIS — R0602 Shortness of breath: Secondary | ICD-10-CM | POA: Diagnosis not present

## 2017-04-01 MED ORDER — POLYETHYLENE GLYCOL 3350 17 G PO PACK
17.0000 g | PACK | Freq: Once | ORAL | Status: AC
  Administered 2017-04-01: 17 g via ORAL
  Filled 2017-04-01: qty 1

## 2017-04-01 MED ORDER — DOCUSATE SODIUM 100 MG PO CAPS: 100.0000 mg | ORAL_CAPSULE | Freq: Two times a day (BID) | ORAL | 0 refills | Status: AC | PRN

## 2017-04-01 MED ORDER — DOCUSATE SODIUM 100 MG PO CAPS
100.0000 mg | ORAL_CAPSULE | Freq: Once | ORAL | Status: AC
  Administered 2017-04-01: 100 mg via ORAL
  Filled 2017-04-01: qty 1

## 2017-04-01 MED ORDER — GLYCERIN (LAXATIVE) 2.1 G RE SUPP
1.0000 | Freq: Once | RECTAL | Status: AC
  Administered 2017-04-01: 1 via RECTAL
  Filled 2017-04-01: qty 1

## 2017-04-01 NOTE — ED Notes (Signed)
Patient came out of room in wheelchair with aide at side. Patient hollering "I've got another appointment at 1 and one at 2. I'm ready to go." Received discharge papers from Dr Reather Converse. Unable to obtain recent vital signs from patient at this time. Patient signed his discharge at nurses station. Discharge instructions given to patient's aide.

## 2017-04-01 NOTE — ED Notes (Signed)
Patient states "I need to take my eyedrops I normally take every day. But they are at home." Patient has personal aide at bedside states she will go home and retrieve patient's medications. Patient verbalized understanding.

## 2017-04-01 NOTE — ED Provider Notes (Signed)
[facility] DEPT Provider Note   CSN: [id number] Arrival date & time: 04/01/17  0849   By signing my name below, I, Delton Prairie, attest that this documentation has been prepared under the direction and in the presence of Elnora Morrison, MD  Electronically Signed: [place], ED Scribe. 04/01/17. 9:02 AM.   History   Chief Complaint Chief Complaint  Patient presents with  . Constipation    HPI Comments:  Dustin Blankenship is a 83 y.o. male, with a PMHx of CHF, COPD, and HTN, who presents to the Emergency Department complaining of ongoing constipation x 1 week. He also reports intermittent left sided abdominal pain. Pt reports narcotic pain medication use. He has taken 4 suppositories with no relief. Pt has also taken Miralax with no relief. Pt denies vomiting, current abdominal pain or any other associated symptoms. No other complaints noted.   The history is provided by the patient. No language interpreter was used.    Past Medical History:  Diagnosis Date  . Atrial fib/flutter, transient    not on warfarin due to risk of falls ,on aspirin for prophylaxis  . CHF (congestive heart failure) (HCC)    right heart failure and pulm htn due to chronic hypoxia from COPD  . COPD (chronic obstructive pulmonary disease) ([place])   . GERD (gastroesophageal reflux disease)   . Glaucoma   . Glaucoma   . Gout   . Hypothyroid   . Pacemaker 09/20/2010    ST Jude Accent DR RF device  MODEL #[id number],SERIAL #[id number]  . Pulmonary HTN (HCC)    DUE TO CHRONIC HYPOXIA   FROM COPD  . Sinus node dysfunction ([place])    Dual-chamber permanent pacermaker ST JUDE    Patient Active Problem List   Diagnosis Date Noted  . Pneumonia 10/24/2015  . Gums, bleeding 10/24/2015  . Dysphagia, unspecified(787.20) 05/11/2014  . Pacemaker - St Jude dual chamber RF device, implanted October 2011 09/18/2013  . Cor pulmonale, chronic ([place]) 09/18/2013  . CAD (coronary artery disease) - suspected based on abnormal  nuclear stress test 09/18/2013  . Atrial fibrillation ([place]) 07/13/2012  . Dysphagia 07/13/2012  . Knee joint effusion 05/30/2011  . VENTRICULAR HYPERTROPHY, LEFT 06/11/2008  . COPD (chronic obstructive pulmonary disease) ([place]) 06/11/2008  . OSTEOARTHRITIS 06/11/2008  . UTI'S, HX OF 06/11/2008    Past Surgical History:  Procedure Laterality Date  . COLONOSCOPY W/ POLYPECTOMY    . DOPPLER ECHOCARDIOGRAPHY  07/25/2011   EF =50-55%  . ESOPHAGEAL DILATION N/A 02/24/2015   Procedure: ESOPHAGEAL DILATION;  Surgeon: [name], MD;  Location[facility];  Service: Endoscopy;  Laterality: N/A;  . ESOPHAGOGASTRODUODENOSCOPY N/A 02/24/2015   Procedure: ESOPHAGOGASTRODUODENOSCOPY (EGD);  Surgeon: [name], MD;  Location[facility];  Service: Endoscopy;  Laterality: N/A;  10:00  . INSERT / REPLACE / REMOVE PACEMAKER  09/20/2010   ST JUDE ACCENT-dual chamber ;    . LUNG BIOPSY    . NM MYOCAR PERF WALL MOTION  07/25/2011       Home Medications    Prior to Admission medications   Medication Sig Start Date End Date Taking? Authorizing Provider  Acetaminophen (TYLENOL ARTHRITIS PAIN PO) Take 650 mg by mouth 2 (two) times daily as needed.     Historical Provider, MD  alfuzosin (UROXATRAL) 10 MG 24 hr tablet Take 1 tablet (10 mg total) by mouth daily with breakfast. 03/07/17   Susy Frizzle, MD  allopurinol (ZYLOPRIM) 300 MG tablet Take 300 mg  by mouth daily.    Historical Provider, MD  aspirin 325 MG EC tablet Take 325 mg by mouth daily.    Historical Provider, MD  Carboxymethylcellulose Sodium 0.25 % SOLN Apply 1 drop to eye 4 (four) times daily as needed.    Historical Provider, MD  COMBIGAN 0.2-0.5 % ophthalmic solution 1 drop every 12 (twelve) hours. Left Eye 10/17/15   Historical Provider, MD  docusate sodium (COLACE) 100 MG capsule Take 100 mg by mouth 2 (two) times daily as needed for mild constipation.    Historical Provider, MD  donepezil (ARICEPT) 10 MG tablet Take 10  mg by mouth at bedtime.  11/15/16   Historical Provider, MD  escitalopram (LEXAPRO) 10 MG tablet Take 10 mg by mouth at bedtime.  01/25/15   Historical Provider, MD  furosemide (LASIX) 20 MG tablet Take 1 tablet (20 mg total) by mouth daily. 12/20/16   Susy Frizzle, MD  ipratropium (ATROVENT) 0.06 % nasal spray Place 2 sprays into both nostrils 4 (four) times daily.     Historical Provider, MD  levothyroxine (SYNTHROID, LEVOTHROID) 125 MCG tablet Take 125 mcg by mouth daily.    Historical Provider, MD  LORazepam (ATIVAN) 1 MG tablet Take 1 tablet (1 mg total) by mouth 2 (two) times daily. 02/18/17   [name], MD  nitroGLYCERIN (NITROSTAT) 0.4 MG SL tablet Place 0.4 mg under the tongue every 5 (five) minutes as needed. Chest Pain    Historical Provider, MD  OXYGEN Inhale 2 L into the lungs continuous. [place]    Historical Provider, MD  pilocarpine (PILOCAR) 1 % ophthalmic solution Place 1 drop into both eyes 3 (three) times daily.  09/26/15   Historical Provider, MD  Polyethyl Glycol-Propyl Glycol (SYSTANE) 0.4-0.3 % GEL Apply 1-2 drops to eye 4 (four) times daily as needed. Per wife sometimes he may use 3 drops in the right eye.     Historical Provider, MD  polyethylene glycol (MIRALAX / GLYCOLAX) packet Take 17 g by mouth daily as needed.     Historical Provider, MD  risperiDONE (RISPERDAL) 0.5 MG tablet TAKE (1) TABLET BY MOUTH TWICE DAILY. 03/06/17   Susy Frizzle, MD  saw palmetto 500 MG capsule Take 500 mg by mouth 2 (two) times daily.     Historical Provider, MD    Family History History reviewed. No pertinent family history.  Social History Social History  Substance Use Topics  . Smoking status: Former Smoker    Packs/day: 3.00    Years: 18.00    Types: Cigarettes    Start date: 02/17/1952    Quit date: 12/16/1953  . Smokeless tobacco: Never Used  . Alcohol use No     Allergies   Morphine and related; Multaq [dronedarone]; Penicillins; Latex; Sulfonamide derivatives; and  Testosterone   Review of Systems Review of Systems  Constitutional: Negative for fever.  Gastrointestinal: Positive for abdominal pain and constipation. Negative for vomiting.  All other systems reviewed and are negative.    Physical Exam Updated Vital Signs BP 99/82 (BP Location: Left Arm)   Pulse 84   Temp 97.4 F (36.3 C) (Oral)   Resp (!) 22   Ht _0  (1.778 m)   Wt 206 lb (93.4 kg)   SpO2 94%   BMI 29.56 kg/m   Physical Exam  Constitutional: He is oriented to person, place, and time. He appears well-developed and well-nourished. No distress.  HENT:  Head: Normocephalic and atraumatic.  Eyes: Conjunctivae are normal.  Cardiovascular: Normal rate.   Pulmonary/Chest: Effort normal.  Abdominal: Soft. He exhibits no distension. There is no tenderness.  Genitourinary:  Genitourinary Comments: Firm stool palpated distal rectum. Manual disimpaction performed.   Neurological: He is alert and oriented to person, place, and time.  Skin: Skin is warm and dry.  Psychiatric: He has a normal mood and affect.  Nursing note and vitals reviewed.    ED Treatments / Results  DIAGNOSTIC STUDIES:  Oxygen Saturation is 94% on [place], adequate by my interpretation.    COORDINATION OF CARE:  9:18 AM Discussed treatment plan with pt at bedside and pt agreed to plan.  Labs (all labs ordered are listed, but only abnormal results are displayed) Labs Reviewed - No data to display  EKG  EKG Interpretation None       Radiology Dg Abdomen 1 View  Result Date: 04/01/2017 CLINICAL DATA:  Constipation EXAM: ABDOMEN - 1 VIEW COMPARISON:  None. FINDINGS: Rectal stool without over distension; no generalized stool retention. Normal small bowel gas pattern. No concerning mass effect or calcification when accounting for pelvic phleboliths. Lumbar disc degeneration with dextroscoliosis. IMPRESSION: 1. Rectal stool without generalized stool retention. 2. Normal bowel gas pattern.  Electronically Signed   By: Monte Fantasia M.D.   On: 04/01/2017 10:44    Procedures Procedures (including critical care time) Stool disimpaction Performed by myself Small amount of stool removed Mild discomfort during process Medications Ordered in ED Medications - No data to display   Initial Impression / Assessment and Plan / ED Course  I have reviewed the triage vital signs and the nursing notes.  Pertinent labs & imaging results that were available during my care of the patient were reviewed by me and considered in my medical decision making (see chart for details).     Patient presents with clinically constipation without vomiting or focal abdominal pain. Manual disimpaction I was able to remove some stool burden. Patient had oral and suppository medications in the ER. Discussed outpatient follow-up. X-ray no acute findings.  Results and differential diagnosis were discussed with the patient/parent/guardian. Xrays were independently reviewed by myself.  Close follow up outpatient was discussed, comfortable with the plan.   Medications  polyethylene glycol (MIRALAX / GLYCOLAX) packet 17 g (17 g Oral Given 04/01/17 0934)  docusate sodium (COLACE) capsule 100 mg (100 mg Oral Given 04/01/17 0932)  Glycerin (Adult) 2.1 g suppository 1 suppository (1 suppository Rectal Given 04/01/17 1051)    Vitals:   04/01/17 0850 04/01/17 0852 04/01/17 1000  BP:  99/82 125/85  Pulse:  84   Resp:  (!) 22   Temp:  97.4 F (36.3 C)   TempSrc:  Oral   SpO2:  94%   Weight: 206 lb (93.4 kg)    Height: _0  (1.778 m)      Final diagnoses:  Constipation, unspecified constipation type     Final Clinical Impressions(s) / ED Diagnoses   Final diagnoses:  None    New Prescriptions New Prescriptions   No medications on file      Elnora Morrison, MD 04/01/17 1132

## 2017-04-01 NOTE — Patient Outreach (Signed)
[facility]) Care Management  04/01/2017  Dustin Blankenship Jul 07, 1920 [id number]  I was scheduled to see [name] at home today but he is currently in the emergency department. I reached out to his wife and primary caregiver/CNA [name] and they asked that I reschedule his appointment for Thursday.   Plan: I will see [name] at home on Thursday.    [facility]  [phone]

## 2017-04-01 NOTE — Discharge Instructions (Signed)
If you were given medicines take as directed.  If you are on coumadin or contraceptives realize their levels and effectiveness is altered by many different medicines.  If you have any reaction (rash, tongues swelling, other) to the medicines stop taking and see a physician.    If your blood pressure was elevated in the ER make sure you follow up for management with a primary doctor or return for chest pain, shortness of breath or stroke symptoms.  Please follow up as directed and return to the ER or see a physician for new or worsening symptoms.  Thank you. Vitals:   04/01/17 0850 04/01/17 0852 04/01/17 1000  BP:  99/82 125/85  Pulse:  84   Resp:  (!) 22   Temp:  97.4 F (36.3 C)   TempSrc:  Oral   SpO2:  94%   Weight: 206 lb (93.4 kg)    Height: _0  (1.778 m)

## 2017-04-01 NOTE — ED Triage Notes (Signed)
Patient from home complaining of abdominal pain and constipation. States he took 3 suppositories this morning. States he has not had bowel movement x 1 week.

## 2017-04-03 ENCOUNTER — Other Ambulatory Visit: Payer: Self-pay | Admitting: *Deleted

## 2017-04-03 NOTE — Patient Outreach (Signed)
[facility]) Care Management   04/03/2017  Dustin Blankenship 02/04/1920 [id number]  Dustin Blankenship is an 83 y.o. male withhistory of COPD, osteoarthritis, UTIs, afib with implanted St. Jude dual chamber RF device implanted in October 2011, coronary artery disease, and dysphagia.   Dustin Blankenship lives at home in [place] with his wife Dustin Blankenship and has 24/7 private duty paid caregivers. His primary caregiver who is very knowledgeableabout Dustin Blankenship needs and has permission by Dustin Blankenship and his wife to share and receive information related to his needs is Dustin Blankenship [phone].   Dustin Blankenship has had cognitive and behavioral changes over the last few months including confusion, paranoia, and verbal aggression.  Subjective: " I have to go get rid of that food that is causing problems with the medicine and then the nap." (patient confused)  Objective:  BP (!) 92/58   Pulse (!) 117   SpO2 90% Comment: O2 @ _0 /[place] continuously  Review of Systems  Constitutional: Positive for malaise/fatigue. Negative for chills, fever and weight loss.  HENT: Negative.   Eyes: Positive for blurred vision.       Per caregiver report, c/o blurry vision  Respiratory: Positive for cough and shortness of breath. Negative for sputum production and wheezing.   Cardiovascular: Positive for leg swelling. Negative for chest pain and palpitations.       1-2+ edema right ankle and lower leg Trace edema left ankle  Gastrointestinal: Positive for constipation. Negative for abdominal pain, nausea and vomiting.  Genitourinary: Negative.   Musculoskeletal: Positive for myalgias. Negative for falls.  Skin: Negative.   Neurological: Positive for dizziness and weakness. Negative for loss of consciousness.  Psychiatric/Behavioral: Positive for memory loss.    Physical Exam  Constitutional: He appears well-developed and well-nourished. He appears lethargic. He is sleeping. He has a sickly appearance. He  appears ill. No distress.  Cardiovascular: An irregularly irregular rhythm present. Tachycardia present.   Respiratory: No tachypnea. No respiratory distress. He has no wheezes. He has no rhonchi. He has no rales.  GI: Soft. Bowel sounds are normal. He exhibits distension. There is no tenderness.  Neurological: He appears lethargic.  Skin: Skin is warm, dry and intact.  Psychiatric: His speech is normal. He is slowed. He expresses impulsivity and inappropriate judgment. He exhibits abnormal recent memory and abnormal remote memory.    Encounter Medications:   Outpatient Encounter Prescriptions as of 04/03/2017  Medication Sig Note  . acetaminophen (TYLENOL) 500 MG tablet Take 1,000 mg by mouth daily.   Marland Kitchen alfuzosin (UROXATRAL) 10 MG 24 hr tablet Take 1 tablet (10 mg total) by mouth daily with breakfast.   . allopurinol (ZYLOPRIM) 300 MG tablet Take 300 mg by mouth daily.   Marland Kitchen aspirin 325 MG EC tablet Take 325 mg by mouth daily.   . Carboxymethylcellulose Sodium 0.25 % SOLN Place 1 drop into the left eye 4 (four) times daily as needed.  08/29/2016: Thera Tears  . COMBIGAN 0.2-0.5 % ophthalmic solution Place 1 drop into the left eye every 12 (twelve) hours. Left Eye   . docusate sodium (COLACE) 100 MG capsule Take 1 capsule (100 mg total) by mouth 2 (two) times daily as needed for mild constipation.   Marland Kitchen donepezil (ARICEPT) 10 MG tablet Take 10 mg by mouth at bedtime.    Marland Kitchen escitalopram (LEXAPRO) 10 MG tablet Take 10 mg by mouth at bedtime.    . furosemide (LASIX) 20 MG tablet Take 1 tablet (20 mg total)  by mouth daily.   Marland Kitchen ipratropium (ATROVENT) 0.06 % nasal spray Place 2 sprays into both nostrils 4 (four) times daily.    Marland Kitchen levothyroxine (SYNTHROID, LEVOTHROID) 125 MCG tablet Take 125 mcg by mouth daily.   Marland Kitchen LORazepam (ATIVAN) 1 MG tablet Take 1 tablet (1 mg total) by mouth 2 (two) times daily.   . nitroGLYCERIN (NITROSTAT) 0.4 MG SL tablet Place 0.4 mg under the tongue every 5 (five) minutes as  needed. Chest Pain 12/15/2015: On hand; new bottle obtained December  . OXYGEN Inhale 2 L into the lungs continuous. [employer]   . pilocarpine (PILOCAR) 1 % ophthalmic solution Place 1 drop into both eyes 3 (three) times daily.    Dustin Blankenship Glycol-Propyl Glycol (SYSTANE) 0.4-0.3 % GEL Place 1-2 drops into the right eye 4 (four) times daily. Per wife sometimes he may use 3 drops in the right eye.    . polyethylene glycol (MIRALAX / GLYCOLAX) packet Take 17 g by mouth daily as needed.    . risperiDONE (RISPERDAL) 0.5 MG tablet TAKE (1) TABLET BY MOUTH TWICE DAILY.   . saw palmetto 500 MG capsule Take 500 mg by mouth 2 (two) times daily.    Assessment:    Doctor, hospital - [name] dementia and began displaying behavior changes, intermittent paranoia, worsening memory, worsening insomnia, and intermittent auditory hallucinations over the last few months.   Dustin Blankenship was talkative and engaging today. He was in a pleasant mood but appeared fatigued and stated he was sleepy. He repeated himself frequently and had difficulty maintaining focus on the topic at hand.   At this time, Dustin Blankenship wishes for she and Dustin Blankenship to remain at home with 24/7 in home private duty caregivers but says she understands that it is likely at some point in the near future, Dustin Blankenship may have to move to a higher level of care.   Dustin Blankenship daughter has moved to the area to provide support to her father.   Acute/Chronic Health Condition (swelling, shortness of breath) - Dustin Blankenship' has had intermittent bilateral ankle swelling over the last few months; Dr. Dennard Schaumann recently resumed Lasix 41m po QD. Mr. MHinderliteris taking his Lasix daily as prescribed. Today, he has 2+ edema of right ankle and calf and 1+ edema of the left ankle. He is not short of breath at rest and moves slowly during ambulation. His caregiver Dustin Sauersays he was more short of breath when she took him to the barber this morning. He has  mild abdominal swelling and his breath sounds are clear anteriorly but diminished. He appears somewhat pale and weaker to me today. He has a persistent non productive cough today. His O2 saturation on O2 at 2L/[place] continuously is 90-93%. I also noted today that Mr. Pilot rate and rhythm are irregular 104-117. Mrs. MDoyle Askewand caregiver Dustin Sauerdeny fever.   Acute/New Health Condition (Constipation) - Mr. MSkillenpresented to the ED on Tuesday because of abdominal discomfort and constipation. He was given Miralax, colace, and a Glycerin suppository and was discharged to home. Today, we discussed a long term plan for management of chronic constipation. I reviewed Mr. MLiewprescribed medications with him and his caregiver. We also discussed dietary measures and I provided Emmi educational materials for support.   Plan:   [place] him in light of his ongoing issues with swelling of ankles and now abdomen, irregular rate/rhythm, reported  worsening shortness of breath, persistent cough and lower O2 saturation today. Vernie Murders RN recommended getting Dustin Blankenship on the schedule with cardiology as Mr. Nott missed his pacer check appointment with  Dr. Lovena Le and his most recent scheduled appointment with Dr. Domenic Polite. I was able to secure an appointment with Jory Sims NP for tomorrow (Friday) @ 3:30pm.   Mr. Costales or his caregiver will call if he does not have a bowel movement at least every 3 days.   Dustin Blankenship or his caregiver will call for other new or worsened symptoms.   I will reach out to DustinWieman by phone next week to follow up on symptoms and general condition.     [facility]  [phone]

## 2017-04-04 ENCOUNTER — Ambulatory Visit: Payer: Self-pay | Admitting: Adult Health

## 2017-04-04 ENCOUNTER — Other Ambulatory Visit: Payer: Self-pay | Admitting: *Deleted

## 2017-04-04 ENCOUNTER — Inpatient Hospital Stay (HOSPITAL_COMMUNITY)
Admission: EM | Admit: 2017-04-04 | Discharge: 2017-04-06 | DRG: 194 | Disposition: A | Discharge status: Home / Self Care | Payer: Medicare Other | Attending: Internal Medicine | Admitting: Internal Medicine

## 2017-04-04 ENCOUNTER — Emergency Department (HOSPITAL_COMMUNITY): Payer: Medicare Other

## 2017-04-04 ENCOUNTER — Encounter (HOSPITAL_COMMUNITY): Payer: Self-pay

## 2017-04-04 DIAGNOSIS — I2723 Pulmonary hypertension due to lung diseases and hypoxia: Secondary | ICD-10-CM | POA: Diagnosis present

## 2017-04-04 DIAGNOSIS — Z7982 Long term (current) use of aspirin: Secondary | ICD-10-CM

## 2017-04-04 DIAGNOSIS — I2510 Atherosclerotic heart disease of native coronary artery without angina pectoris: Secondary | ICD-10-CM | POA: Diagnosis not present

## 2017-04-04 DIAGNOSIS — I5032 Chronic diastolic (congestive) heart failure: Secondary | ICD-10-CM | POA: Diagnosis not present

## 2017-04-04 DIAGNOSIS — J9611 Chronic respiratory failure with hypoxia: Secondary | ICD-10-CM | POA: Diagnosis present

## 2017-04-04 DIAGNOSIS — Z66 Do not resuscitate: Secondary | ICD-10-CM | POA: Diagnosis present

## 2017-04-04 DIAGNOSIS — J189 Pneumonia, unspecified organism: Secondary | ICD-10-CM | POA: Diagnosis not present

## 2017-04-04 DIAGNOSIS — F03918 Unspecified dementia, unspecified severity, with other behavioral disturbance: Secondary | ICD-10-CM | POA: Diagnosis present

## 2017-04-04 DIAGNOSIS — I2781 Cor pulmonale (chronic): Secondary | ICD-10-CM | POA: Diagnosis present

## 2017-04-04 DIAGNOSIS — J440 Chronic obstructive pulmonary disease with acute lower respiratory infection: Secondary | ICD-10-CM | POA: Diagnosis present

## 2017-04-04 DIAGNOSIS — I2583 Coronary atherosclerosis due to lipid rich plaque: Secondary | ICD-10-CM

## 2017-04-04 DIAGNOSIS — E039 Hypothyroidism, unspecified: Secondary | ICD-10-CM | POA: Diagnosis present

## 2017-04-04 DIAGNOSIS — Z79890 Hormone replacement therapy (postmenopausal): Secondary | ICD-10-CM

## 2017-04-04 DIAGNOSIS — Z880 Allergy status to penicillin: Secondary | ICD-10-CM | POA: Diagnosis not present

## 2017-04-04 DIAGNOSIS — I482 Chronic atrial fibrillation: Secondary | ICD-10-CM | POA: Diagnosis present

## 2017-04-04 DIAGNOSIS — Z9981 Dependence on supplemental oxygen: Secondary | ICD-10-CM

## 2017-04-04 DIAGNOSIS — R1310 Dysphagia, unspecified: Secondary | ICD-10-CM | POA: Diagnosis present

## 2017-04-04 DIAGNOSIS — J431 Panlobular emphysema: Secondary | ICD-10-CM

## 2017-04-04 DIAGNOSIS — I6789 Other cerebrovascular disease: Secondary | ICD-10-CM | POA: Diagnosis not present

## 2017-04-04 DIAGNOSIS — R05 Cough: Secondary | ICD-10-CM | POA: Diagnosis not present

## 2017-04-04 DIAGNOSIS — Z888 Allergy status to other drugs, medicaments and biological substances status: Secondary | ICD-10-CM

## 2017-04-04 DIAGNOSIS — F4489 Other dissociative and conversion disorders: Secondary | ICD-10-CM | POA: Diagnosis not present

## 2017-04-04 DIAGNOSIS — M109 Gout, unspecified: Secondary | ICD-10-CM | POA: Diagnosis present

## 2017-04-04 DIAGNOSIS — Z950 Presence of cardiac pacemaker: Secondary | ICD-10-CM

## 2017-04-04 DIAGNOSIS — M1990 Unspecified osteoarthritis, unspecified site: Secondary | ICD-10-CM | POA: Diagnosis present

## 2017-04-04 DIAGNOSIS — I5082 Biventricular heart failure: Secondary | ICD-10-CM | POA: Diagnosis present

## 2017-04-04 DIAGNOSIS — J181 Lobar pneumonia, unspecified organism: Secondary | ICD-10-CM

## 2017-04-04 DIAGNOSIS — Z87891 Personal history of nicotine dependence: Secondary | ICD-10-CM | POA: Diagnosis not present

## 2017-04-04 DIAGNOSIS — Z885 Allergy status to narcotic agent status: Secondary | ICD-10-CM | POA: Diagnosis not present

## 2017-04-04 DIAGNOSIS — K219 Gastro-esophageal reflux disease without esophagitis: Secondary | ICD-10-CM | POA: Diagnosis present

## 2017-04-04 DIAGNOSIS — E860 Dehydration: Secondary | ICD-10-CM | POA: Diagnosis not present

## 2017-04-04 DIAGNOSIS — E876 Hypokalemia: Secondary | ICD-10-CM | POA: Diagnosis present

## 2017-04-04 DIAGNOSIS — F0391 Unspecified dementia with behavioral disturbance: Secondary | ICD-10-CM | POA: Diagnosis present

## 2017-04-04 DIAGNOSIS — H409 Unspecified glaucoma: Secondary | ICD-10-CM | POA: Diagnosis present

## 2017-04-04 DIAGNOSIS — Z882 Allergy status to sulfonamides status: Secondary | ICD-10-CM

## 2017-04-04 DIAGNOSIS — Z79899 Other long term (current) drug therapy: Secondary | ICD-10-CM

## 2017-04-04 DIAGNOSIS — R0602 Shortness of breath: Secondary | ICD-10-CM | POA: Diagnosis not present

## 2017-04-04 DIAGNOSIS — Z87440 Personal history of urinary (tract) infections: Secondary | ICD-10-CM

## 2017-04-04 DIAGNOSIS — I4891 Unspecified atrial fibrillation: Secondary | ICD-10-CM | POA: Diagnosis present

## 2017-04-04 DIAGNOSIS — R404 Transient alteration of awareness: Secondary | ICD-10-CM | POA: Diagnosis not present

## 2017-04-04 DIAGNOSIS — K5900 Constipation, unspecified: Secondary | ICD-10-CM | POA: Diagnosis present

## 2017-04-04 DIAGNOSIS — J449 Chronic obstructive pulmonary disease, unspecified: Secondary | ICD-10-CM | POA: Diagnosis present

## 2017-04-04 LAB — BASIC METABOLIC PANEL
ANION GAP: 9 (ref 5–15)
BUN: 22 mg/dL — ABNORMAL HIGH (ref 6–20)
CALCIUM: 9.1 mg/dL (ref 8.9–10.3)
CHLORIDE: 101 mmol/L (ref 101–111)
CO2: 31 mmol/L (ref 22–32)
CREATININE: 1 mg/dL (ref 0.61–1.24)
GFR calc non Af Amer: >60 mL/min (ref >60)
Glucose, Bld: 118 mg/dL — ABNORMAL HIGH (ref 65–99)
Potassium: 3.2 mmol/L — ABNORMAL LOW (ref 3.5–5.1)
SODIUM: 141 mmol/L (ref 135–145)

## 2017-04-04 LAB — CBC WITH DIFFERENTIAL/PLATELET
BASOS PCT: 0 %
Basophils Absolute: 0 10*3/uL (ref 0.0–0.1)
EOS ABS: 0.1 10*3/uL (ref 0.0–0.7)
Eosinophils Relative: 1 %
HCT: 37.7 % — ABNORMAL LOW (ref 39.0–52.0)
HEMOGLOBIN: 12.2 g/dL — AB (ref 13.0–17.0)
Lymphocytes Relative: 12 %
Lymphs Abs: 1.6 10*3/uL (ref 0.7–4.0)
MCH: 30.8 pg (ref 26.0–34.0)
MCHC: 32.4 g/dL (ref 30.0–36.0)
MCV: 95.2 fL (ref 78.0–100.0)
Monocytes Absolute: 1.6 10*3/uL — ABNORMAL HIGH (ref 0.1–1.0)
Monocytes Relative: 12 %
NEUTROS PCT: 75 %
Neutro Abs: 9.7 10*3/uL — ABNORMAL HIGH (ref 1.7–7.7)
Platelets: 200 10*3/uL (ref 150–400)
RBC: 3.96 MIL/uL — AB (ref 4.22–5.81)
RDW: 14.5 % (ref 11.5–15.5)
WBC: 13 10*3/uL — AB (ref 4.0–10.5)

## 2017-04-04 MED ORDER — IPRATROPIUM BROMIDE 0.06 % NA SOLN: 2.0000 | Freq: Four times a day (QID) | NASAL | Status: DC

## 2017-04-04 MED ORDER — PILOCARPINE HCL 1 % OP SOLN
1.0000 [drp] | Freq: Three times a day (TID) | OPHTHALMIC | Status: DC
  Administered 2017-04-04 – 2017-04-06 (×7): 1 [drp] via OPHTHALMIC
  Filled 2017-04-04: qty 15

## 2017-04-04 MED ORDER — MAGNESIUM SULFATE 2 GM/50ML IV SOLN: 1.0000 g | Freq: Once | INTRAVENOUS | Status: DC

## 2017-04-04 MED ORDER — LORAZEPAM 1 MG PO TABS
1.0000 mg | ORAL_TABLET | Freq: Once | ORAL | Status: AC
  Administered 2017-04-04: 1 mg via ORAL
  Filled 2017-04-04: qty 1

## 2017-04-04 MED ORDER — ALLOPURINOL 300 MG PO TABS
300.0000 mg | ORAL_TABLET | Freq: Every day | ORAL | Status: DC
  Administered 2017-04-04 – 2017-04-06 (×3): 300 mg via ORAL
  Filled 2017-04-04 (×4): qty 1

## 2017-04-04 MED ORDER — ALFUZOSIN HCL ER 10 MG PO TB24
10.0000 mg | ORAL_TABLET | Freq: Every day | ORAL | Status: DC
  Administered 2017-04-04 – 2017-04-05 (×2): 10 mg via ORAL
  Filled 2017-04-04 (×4): qty 1

## 2017-04-04 MED ORDER — POLYETHYLENE GLYCOL 3350 17 G PO PACK
17.0000 g | PACK | Freq: Every day | ORAL | Status: DC
  Administered 2017-04-05 – 2017-04-06 (×2): 17 g via ORAL
  Filled 2017-04-04 (×3): qty 1

## 2017-04-04 MED ORDER — ALBUTEROL SULFATE (2.5 MG/3ML) 0.083% IN NEBU
5.0000 mg | INHALATION_SOLUTION | Freq: Once | RESPIRATORY_TRACT | Status: AC
  Administered 2017-04-04: 5 mg via RESPIRATORY_TRACT
  Filled 2017-04-04: qty 6

## 2017-04-04 MED ORDER — DONEPEZIL HCL 10 MG PO TABS
10.0000 mg | ORAL_TABLET | Freq: Every day | ORAL | Status: DC
  Administered 2017-04-04 – 2017-04-05 (×2): 10 mg via ORAL
  Filled 2017-04-04: qty 1
  Filled 2017-04-04: qty 2
  Filled 2017-04-04 (×2): qty 1
  Filled 2017-04-04: qty 2

## 2017-04-04 MED ORDER — LEVOTHYROXINE SODIUM 50 MCG PO TABS
125.0000 ug | ORAL_TABLET | Freq: Every day | ORAL | Status: DC
  Administered 2017-04-04 – 2017-04-06 (×3): 125 ug via ORAL
  Filled 2017-04-04 (×2): qty 3
  Filled 2017-04-04: qty 5
  Filled 2017-04-04: qty 3

## 2017-04-04 MED ORDER — ESCITALOPRAM OXALATE 10 MG PO TABS
10.0000 mg | ORAL_TABLET | Freq: Every day | ORAL | Status: DC
  Administered 2017-04-04 – 2017-04-05 (×2): 10 mg via ORAL
  Filled 2017-04-04 (×2): qty 1

## 2017-04-04 MED ORDER — LEVOFLOXACIN IN D5W 750 MG/150ML IV SOLN
750.0000 mg | INTRAVENOUS | Status: DC
  Administered 2017-04-06: 750 mg via INTRAVENOUS
  Filled 2017-04-04 (×2): qty 150

## 2017-04-04 MED ORDER — TIMOLOL MALEATE 0.5 % OP SOLN
1.0000 [drp] | Freq: Two times a day (BID) | OPHTHALMIC | Status: DC
  Administered 2017-04-04 – 2017-04-06 (×5): 1 [drp] via OPHTHALMIC
  Filled 2017-04-04: qty 5

## 2017-04-04 MED ORDER — POLYVINYL ALCOHOL 1.4 % OP SOLN: 1.0000 [drp] | Freq: Four times a day (QID) | OPHTHALMIC | Status: DC | PRN

## 2017-04-04 MED ORDER — HYPROMELLOSE (GONIOSCOPIC) 2.5 % OP SOLN
1.0000 [drp] | Freq: Four times a day (QID) | OPHTHALMIC | Status: DC | PRN
  Filled 2017-04-04: qty 15

## 2017-04-04 MED ORDER — ACETAMINOPHEN 325 MG PO TABS: 650.0000 mg | ORAL_TABLET | Freq: Four times a day (QID) | ORAL | Status: DC | PRN

## 2017-04-04 MED ORDER — POTASSIUM CHLORIDE CRYS ER 20 MEQ PO TBCR
40.0000 meq | EXTENDED_RELEASE_TABLET | ORAL | Status: AC
  Administered 2017-04-04 (×2): 40 meq via ORAL
  Filled 2017-04-04 (×2): qty 2

## 2017-04-04 MED ORDER — DOCUSATE SODIUM 100 MG PO CAPS: 100.0000 mg | ORAL_CAPSULE | Freq: Two times a day (BID) | ORAL | Status: DC | PRN

## 2017-04-04 MED ORDER — IPRATROPIUM-ALBUTEROL 0.5-2.5 (3) MG/3ML IN SOLN
3.0000 mL | Freq: Four times a day (QID) | RESPIRATORY_TRACT | Status: DC
  Administered 2017-04-04 (×3): 3 mL via RESPIRATORY_TRACT
  Filled 2017-04-04 (×3): qty 3

## 2017-04-04 MED ORDER — BRIMONIDINE TARTRATE-TIMOLOL 0.2-0.5 % OP SOLN
1.0000 [drp] | Freq: Two times a day (BID) | OPHTHALMIC | Status: DC
  Filled 2017-04-04: qty 5

## 2017-04-04 MED ORDER — MILK AND MOLASSES ENEMA: 1.0000 | Freq: Once | RECTAL | Status: DC

## 2017-04-04 MED ORDER — POLYETHYL GLYCOL-PROPYL GLYCOL 0.4-0.3 % OP GEL
Freq: Four times a day (QID) | OPHTHALMIC | Status: DC
  Filled 2017-04-04: qty 10

## 2017-04-04 MED ORDER — SODIUM CHLORIDE 0.9 % IV SOLN
30.0000 meq | Freq: Once | INTRAVENOUS | Status: DC
  Filled 2017-04-04: qty 15

## 2017-04-04 MED ORDER — IPRATROPIUM-ALBUTEROL 0.5-2.5 (3) MG/3ML IN SOLN
3.0000 mL | RESPIRATORY_TRACT | Status: DC | PRN
Start: 2017-04-04 — End: 2017-04-06

## 2017-04-04 MED ORDER — LEVOFLOXACIN IN D5W 750 MG/150ML IV SOLN
750.0000 mg | Freq: Once | INTRAVENOUS | Status: AC
  Administered 2017-04-04: 750 mg via INTRAVENOUS
  Filled 2017-04-04: qty 150

## 2017-04-04 MED ORDER — ENOXAPARIN SODIUM 40 MG/0.4ML ~~LOC~~ SOLN
40.0000 mg | SUBCUTANEOUS | Status: DC
  Administered 2017-04-04 – 2017-04-06 (×3): 40 mg via SUBCUTANEOUS
  Filled 2017-04-04 (×4): qty 0.4

## 2017-04-04 MED ORDER — GUAIFENESIN ER 600 MG PO TB12
1200.0000 mg | ORAL_TABLET | Freq: Two times a day (BID) | ORAL | Status: DC
  Administered 2017-04-04 – 2017-04-06 (×4): 1200 mg via ORAL
  Filled 2017-04-04 (×5): qty 2

## 2017-04-04 MED ORDER — POTASSIUM CHLORIDE CRYS ER 20 MEQ PO TBCR: 30.0000 meq | EXTENDED_RELEASE_TABLET | Freq: Once | ORAL | Status: DC

## 2017-04-04 MED ORDER — RISPERIDONE 0.5 MG PO TABS
0.5000 mg | ORAL_TABLET | Freq: Two times a day (BID) | ORAL | Status: DC
  Administered 2017-04-04 – 2017-04-06 (×5): 0.5 mg via ORAL
  Filled 2017-04-04 (×6): qty 1

## 2017-04-04 MED ORDER — POLYETHYLENE GLYCOL 3350 17 G PO PACK: 17.0000 g | PACK | Freq: Every day | ORAL | Status: DC | PRN

## 2017-04-04 MED ORDER — POTASSIUM CHLORIDE 10 MEQ/100ML IV SOLN
10.0000 meq | INTRAVENOUS | Status: AC
  Administered 2017-04-04 (×3): 10 meq via INTRAVENOUS
  Filled 2017-04-04 (×2): qty 100

## 2017-04-04 MED ORDER — IPRATROPIUM-ALBUTEROL 0.5-2.5 (3) MG/3ML IN SOLN
3.0000 mL | Freq: Three times a day (TID) | RESPIRATORY_TRACT | Status: DC
Start: 2017-04-05 — End: 2017-04-06
  Administered 2017-04-05 – 2017-04-06 (×5): 3 mL via RESPIRATORY_TRACT
  Filled 2017-04-04 (×5): qty 3

## 2017-04-04 MED ORDER — LORAZEPAM 1 MG PO TABS
1.0000 mg | ORAL_TABLET | Freq: Two times a day (BID) | ORAL | Status: DC
  Administered 2017-04-04 – 2017-04-05 (×3): 1 mg via ORAL
  Filled 2017-04-04 (×5): qty 1

## 2017-04-04 MED ORDER — ASPIRIN EC 325 MG PO TBEC
325.0000 mg | DELAYED_RELEASE_TABLET | Freq: Every day | ORAL | Status: DC
  Administered 2017-04-04 – 2017-04-06 (×3): 325 mg via ORAL
  Filled 2017-04-04 (×4): qty 1

## 2017-04-04 MED ORDER — BRIMONIDINE TARTRATE 0.2 % OP SOLN
1.0000 [drp] | Freq: Two times a day (BID) | OPHTHALMIC | Status: DC
  Administered 2017-04-04 – 2017-04-06 (×4): 1 [drp] via OPHTHALMIC
  Filled 2017-04-04: qty 5

## 2017-04-04 NOTE — ED Notes (Signed)
Daughter states that the pt des not want to drink water because he doesn't want to have to get up and go to the restroom.

## 2017-04-04 NOTE — ED Notes (Signed)
Pt stating, "I'm leaving." "I want to go home." Pt stating he was not going to allow me to start another IV. Pt very agitated at this time. Daughter states that the pt takes a xanax at this time.

## 2017-04-04 NOTE — ED Provider Notes (Signed)
[facility] DEPT Provider Note   CSN: [id number] Arrival date & time: 04/04/17  0111     History   Chief Complaint Chief Complaint  Patient presents with  . Dehydration   LEVEL 5 CAVEAT DUE TO ALTERED MENTAL STATUS  HPI Dustin Blankenship is a 83 y.o. male.  The history is provided by the patient and a caregiver.  Weakness  This is a new problem. The current episode started more than 2 days ago. The problem has been gradually worsening. There has been no fever. Associated symptoms include shortness of breath and confusion.  Pt is a 83 yo male with multiple medical conditions (atrial fibrillation, CHF/COPD) presents with with increasing dyspnea, confusion and also fatigue This has been worse over the past 4-5 days No fever/vomiting He had recent constipation but this is improved Per caregiver his mobility and energy levels have decreased over past week He is on home oxygen (2L)  Past Medical History:  Diagnosis Date  . Atrial fib/flutter, transient    not on warfarin due to risk of falls ,on aspirin for prophylaxis  . CHF (congestive heart failure) (HCC)    right heart failure and pulm htn due to chronic hypoxia from COPD  . COPD (chronic obstructive pulmonary disease) ([place])   . GERD (gastroesophageal reflux disease)   . Glaucoma   . Glaucoma   . Gout   . Hypothyroid   . Pacemaker 09/20/2010    ST Jude Accent DR RF device  MODEL #PN2210,SERIAL #[id number]  . Pulmonary HTN (HCC)    DUE TO CHRONIC HYPOXIA   FROM COPD  . Sinus node dysfunction ([place])    Dual-chamber permanent pacermaker ST JUDE    Patient Active Problem List   Diagnosis Date Noted  . Pneumonia 10/24/2015  . Gums, bleeding 10/24/2015  . Dysphagia, unspecified(787.20) 05/11/2014  . Pacemaker - St Jude dual chamber RF device, implanted October 2011 09/18/2013  . Cor pulmonale, chronic ([place]) 09/18/2013  . CAD (coronary artery disease) - suspected based on abnormal nuclear stress test 09/18/2013  . Atrial  fibrillation ([place]) 07/13/2012  . Dysphagia 07/13/2012  . Knee joint effusion 05/30/2011  . VENTRICULAR HYPERTROPHY, LEFT 06/11/2008  . COPD (chronic obstructive pulmonary disease) ([place]) 06/11/2008  . OSTEOARTHRITIS 06/11/2008  . UTI'S, HX OF 06/11/2008    Past Surgical History:  Procedure Laterality Date  . COLONOSCOPY W/ POLYPECTOMY    . DOPPLER ECHOCARDIOGRAPHY  07/25/2011   EF =50-55%  . ESOPHAGEAL DILATION N/A 02/24/2015   Procedure: ESOPHAGEAL DILATION;  Surgeon: [name], MD;  Location: [facility] ENDO SUITE;  Service: Endoscopy;  Laterality: N/A;  . ESOPHAGOGASTRODUODENOSCOPY N/A 02/24/2015   Procedure: ESOPHAGOGASTRODUODENOSCOPY (EGD);  Surgeon: [name], MD;  Location: [facility] ENDO SUITE;  Service: Endoscopy;  Laterality: N/A;  10:00  . INSERT / REPLACE / REMOVE PACEMAKER  09/20/2010   ST JUDE ACCENT-dual chamber ;    . LUNG BIOPSY    . NM MYOCAR PERF WALL MOTION  07/25/2011       Home Medications    Prior to Admission medications   Medication Sig Start Date End Date Taking? Authorizing Provider  acetaminophen (TYLENOL) 500 MG tablet Take 1,000 mg by mouth daily.    Historical Provider, MD  alfuzosin (UROXATRAL) 10 MG 24 hr tablet Take 1 tablet (10 mg total) by mouth daily with breakfast. 03/07/17   Susy Frizzle, MD  allopurinol (ZYLOPRIM) 300 MG tablet Take 300 mg by mouth daily.    Historical Provider, MD  aspirin  325 MG EC tablet Take 325 mg by mouth daily.    Historical Provider, MD  Carboxymethylcellulose Sodium 0.25 % SOLN Place 1 drop into the left eye 4 (four) times daily as needed.     Historical Provider, MD  COMBIGAN 0.2-0.5 % ophthalmic solution Place 1 drop into the left eye every 12 (twelve) hours. Left Eye 10/17/15   Historical Provider, MD  docusate sodium (COLACE) 100 MG capsule Take 1 capsule (100 mg total) by mouth 2 (two) times daily as needed for mild constipation. 04/01/17   Elnora Morrison, MD  donepezil (ARICEPT) 10 MG tablet Take 10 mg by mouth at  bedtime.  11/15/16   Historical Provider, MD  escitalopram (LEXAPRO) 10 MG tablet Take 10 mg by mouth at bedtime.  01/25/15   Historical Provider, MD  furosemide (LASIX) 20 MG tablet Take 1 tablet (20 mg total) by mouth daily. 12/20/16   Susy Frizzle, MD  ipratropium (ATROVENT) 0.06 % nasal spray Place 2 sprays into both nostrils 4 (four) times daily.     Historical Provider, MD  levothyroxine (SYNTHROID, LEVOTHROID) 125 MCG tablet Take 125 mcg by mouth daily.    Historical Provider, MD  LORazepam (ATIVAN) 1 MG tablet Take 1 tablet (1 mg total) by mouth 2 (two) times daily. 02/18/17   [name], MD  nitroGLYCERIN (NITROSTAT) 0.4 MG SL tablet Place 0.4 mg under the tongue every 5 (five) minutes as needed. Chest Pain    Historical Provider, MD  OXYGEN Inhale 2 L into the lungs continuous. [place]    Historical Provider, MD  pilocarpine (PILOCAR) 1 % ophthalmic solution Place 1 drop into both eyes 3 (three) times daily.  09/26/15   Historical Provider, MD  Polyethyl Glycol-Propyl Glycol (SYSTANE) 0.4-0.3 % GEL Place 1-2 drops into the right eye 4 (four) times daily. Per wife sometimes he may use 3 drops in the right eye.     Historical Provider, MD  polyethylene glycol (MIRALAX / GLYCOLAX) packet Take 17 g by mouth daily as needed.     Historical Provider, MD  risperiDONE (RISPERDAL) 0.5 MG tablet TAKE (1) TABLET BY MOUTH TWICE DAILY. 03/06/17   Susy Frizzle, MD  saw palmetto 500 MG capsule Take 500 mg by mouth 2 (two) times daily.     Historical Provider, MD    Family History No family history on file.  Social History Social History  Substance Use Topics  . Smoking status: Former Smoker    Packs/day: 3.00    Years: 18.00    Types: Cigarettes    Start date: 02/17/1952    Quit date: 12/16/1953  . Smokeless tobacco: Never Used  . Alcohol use No     Allergies   Morphine and related; Multaq [dronedarone]; Penicillins; Latex; Sulfonamide derivatives; and Testosterone   Review of  Systems Review of Systems  Unable to perform ROS: Mental status change  Respiratory: Positive for shortness of breath.   Neurological: Positive for weakness.  Psychiatric/Behavioral: Positive for confusion.     Physical Exam Updated Vital Signs BP 125/73   Pulse 91   Temp 98.1 F (36.7 C) (Oral)   Resp (!) 22   Ht _0  (1.778 m)   Wt 93.4 kg   SpO2 98%   BMI 29.56 kg/m   Physical Exam CONSTITUTIONAL: elderly, frail, chronically ill appearing, agitated HEAD: Normocephalic/atraumatic ENMT: Mucous membranes dry NECK: supple no meningeal signs SPINE/BACK:entire spine nontender CV: no loud murmurs noted LUNGS: mild tachypnea noted, wheezing noted, decreased BS  bilaterally ABDOMEN: soft, nontender, no rebound or guarding, bowel sounds noted throughout abdomen NEURO: Pt is awake/alert but appears confused and is mildly agitated, maex4 EXTREMITIES: pulses normal/equal, full ROM, mild LE edema noted SKIN: warm, color normal PSYCH: agitated  ED Treatments / Results  Labs (all labs ordered are listed, but only abnormal results are displayed) Labs Reviewed  BASIC METABOLIC PANEL - Abnormal; Notable for the following:       Result Value   Potassium 3.2 (*)    Glucose, Bld 118 (*)    BUN 22 (*)    All other components within normal limits  CBC WITH DIFFERENTIAL/PLATELET - Abnormal; Notable for the following:    WBC 13.0 (*)    RBC 3.96 (*)    Hemoglobin 12.2 (*)    HCT 37.7 (*)    Neutro Abs 9.7 (*)    Monocytes Absolute 1.6 (*)    All other components within normal limits  URINALYSIS, ROUTINE W REFLEX MICROSCOPIC    EKG  EKG Interpretation  Date/Time:  Friday April 04 2017 01:29:16 EDT Ventricular Rate:  86 PR Interval:    QRS Duration: 100 QT Interval:  374 QTC Calculation: 448 R Axis:   6 Text Interpretation:  Atrial fibrillation Low voltage, precordial leads Confirmed by Christy Gentles  MD, Virgilene Stryker ([id number]) on 04/04/2017 2:10:26 AM       Radiology Dg Chest Port  1 View  Result Date: 04/04/2017 CLINICAL DATA:  Shortness of breath, congestion, cough, and wheezing. EXAM: PORTABLE CHEST 1 VIEW COMPARISON:  10/24/2015 FINDINGS: Cardiac pacemaker. Shallow inspiration with linear atelectasis in the lung bases. There patchy airspace infiltrates demonstrated in the mid and lower lung zones bilaterally, more prominent on the left. These changes may indicate edema or pneumonia. Bilateral hilar prominence is similar to previous study, probably representing prominent vascularity. No blunting of costophrenic angles. No pneumothorax. Cardiac enlargement without vascular congestion. Calcified, tortuous, and ectatic aorta. IMPRESSION: Bilateral mid and lower lung zone airspace infiltrates likely to represent pneumonia or edema. Atelectasis in the lung bases. Cardiac enlargement. Electronically Signed   By: Lucienne Capers M.D.   On: 04/04/2017 03:17    Procedures Procedures (including critical care time)  Medications Ordered in ED Medications  levofloxacin (LEVAQUIN) IVPB 750 mg (not administered)  albuterol (PROVENTIL) (2.5 MG/3ML) 0.083% nebulizer solution 5 mg (5 mg Nebulization Given 04/04/17 0323)  LORazepam (ATIVAN) tablet 1 mg (1 mg Oral Given 04/04/17 0358)     Initial Impression / Assessment and Plan / ED Course  I have reviewed the triage vital signs and the nursing notes.  Pertinent labs & imaging results that were available during my care of the patient were reviewed by me and considered in my medical decision making (see chart for details).     4:43 AM Pt in the ED with increasing fatigue/delirium and increased SOB He has signs of pneumonia on xray Due to age/risk factors, will admit for IV antibiotics Pt is agitated and wants to go home He has jh/o dementia which is playing a role  D/w dr opyd for admission Family updated on plan   Final Clinical Impressions(s) / ED Diagnoses   Final diagnoses:  Community acquired pneumonia of right middle  lobe of lung [facility])    New Prescriptions New Prescriptions   No medications on file     Ripley Fraise, MD 04/04/17 [phone]

## 2017-04-04 NOTE — ED Notes (Signed)
Per caregiver sitting with pt, the pt pulled the iv out of his left forearm. Large amount of bruising noted under the skin. This RN was not made aware of this until this RN went in the room to start Levaquin.

## 2017-04-04 NOTE — ED Notes (Signed)
Pt's daughter came to the nurses station to get an update. This RN made her aware of the labs that have been drawn and the need for a urine specimen.   Daughter states that the pt "wants to die" and that he states he wants to "go be with Jesus."   She states he would not want to be resuscitated. This RN advised the daughter that she would need to discuss this with doctor.

## 2017-04-04 NOTE — H&P (Signed)
History and Physical    [name]:[id number] DOB: 06-27-1920 DOA: 04/04/2017  PCP: Odette Fraction, MD   Patient coming from: Home  Chief Complaint: Coughing, short of breath, increased confusion   HPI: Dustin Blankenship is a 83 y.o. male with medical history significant for chronic atrial fibrillation, chronic diastolic CHF, COPD with chronic respiratory failure, hypothyroidism, dementia with behavioral disturbance, and sick sinus syndrome with pacer who now presents from home to the emergency department for evaluation of cough, dyspnea, and increased confusion. Patient is accompanied by his family and caretaker who assist with the history. He had reportedly been doing fairly well aside from some constipation until approximately 2 days ago when he was noted to have a dry cough and seemed short of breath with movements. Constipation has resolved, but the cough has progressively worsened and has become productive. There has been some more confusion than is typical for the patient. He has not been complaining of anything in particular, but family brings him in with concern for the worsening cough and dyspnea. They also note that he has not been eating or drinking much of anything for the past 2 days.  ED Course: Upon arrival to the ED, patient is found to be afebrile, saturating adequately on his usual 3 L per minute of supplemental oxygen, and with vitals otherwise stable. EKG features atrial fibrillation with low voltage QRS and chest x-rays notable for bilateral mid and lower lung zone airspace opacities, likely representing pneumonia. Chemistry panels notable for a potassium of 3.2 and CBC features a leukocytosis to 13,000 and a stable normocytic anemia with hemoglobin of 12.2. Patient was treated with albuterol in the emergency department and started on empiric Levaquin for community-acquired pneumonia. Patient became very anxious in the emergency department and was treated with 1 mg Ativan. He  has been hemodynamically stable and in some mild respiratory distress. He will be admitted to the medical surgical unit for ongoing evaluation and management of community acquired pneumonia.  Review of Systems:  All other systems reviewed and apart from HPI, are negative.  Past Medical History:  Diagnosis Date  . Atrial fib/flutter, transient    not on warfarin due to risk of falls ,on aspirin for prophylaxis  . CHF (congestive heart failure) (HCC)    right heart failure and pulm htn due to chronic hypoxia from COPD  . COPD (chronic obstructive pulmonary disease) ([place])   . GERD (gastroesophageal reflux disease)   . Glaucoma   . Glaucoma   . Gout   . Hypothyroid   . Pacemaker 09/20/2010    ST Jude Accent DR RF device  MODEL #[id number],SERIAL #[id number]  . Pulmonary HTN (HCC)    DUE TO CHRONIC HYPOXIA   FROM COPD  . Sinus node dysfunction ([place])    Dual-chamber permanent pacermaker ST JUDE    Past Surgical History:  Procedure Laterality Date  . COLONOSCOPY W/ POLYPECTOMY    . DOPPLER ECHOCARDIOGRAPHY  07/25/2011   EF =50-55%  . ESOPHAGEAL DILATION N/A 02/24/2015   Procedure: ESOPHAGEAL DILATION;  Surgeon: [name], MD;  [facility];  Service: Endoscopy;  Laterality: N/A;  . ESOPHAGOGASTRODUODENOSCOPY N/A 02/24/2015   Procedure: ESOPHAGOGASTRODUODENOSCOPY (EGD);  Surgeon: [name], MD;  Location: [facility];  Service: Endoscopy;  Laterality: N/A;  10:00  . INSERT / REPLACE / REMOVE PACEMAKER  09/20/2010   ST JUDE ACCENT-dual chamber ;    . LUNG BIOPSY    . NM MYOCAR PERF WALL MOTION  07/25/2011     reports that he quit smoking about 63 years ago. His smoking use included Cigarettes. He started smoking about 65 years ago. He has a 54.00 pack-year smoking history. He has never used smokeless tobacco. He reports that he does not drink alcohol or use drugs.  Allergies  Allergen Reactions  . Morphine And Related   . Multaq [Dronedarone]   . Penicillins Other  (See Comments)    Unknown. Patient received zosyn this am at 0415(10/24/15) without any problems  . Latex Rash  . Sulfonamide Derivatives Rash  . Testosterone Rash    The cream form causes a rash when rubbed on skin.     History reviewed. No pertinent family history.   Prior to Admission medications   Medication Sig Start Date End Date Taking? Authorizing Provider  acetaminophen (TYLENOL) 500 MG tablet Take 1,000 mg by mouth daily.    Historical Provider, MD  alfuzosin (UROXATRAL) 10 MG 24 hr tablet Take 1 tablet (10 mg total) by mouth daily with breakfast. 03/07/17   Susy Frizzle, MD  allopurinol (ZYLOPRIM) 300 MG tablet Take 300 mg by mouth daily.    Historical Provider, MD  aspirin 325 MG EC tablet Take 325 mg by mouth daily.    Historical Provider, MD  Carboxymethylcellulose Sodium 0.25 % SOLN Place 1 drop into the left eye 4 (four) times daily as needed.     Historical Provider, MD  COMBIGAN 0.2-0.5 % ophthalmic solution Place 1 drop into the left eye every 12 (twelve) hours. Left Eye 10/17/15   Historical Provider, MD  docusate sodium (COLACE) 100 MG capsule Take 1 capsule (100 mg total) by mouth 2 (two) times daily as needed for mild constipation. 04/01/17   Elnora Morrison, MD  donepezil (ARICEPT) 10 MG tablet Take 10 mg by mouth at bedtime.  11/15/16   Historical Provider, MD  escitalopram (LEXAPRO) 10 MG tablet Take 10 mg by mouth at bedtime.  01/25/15   Historical Provider, MD  furosemide (LASIX) 20 MG tablet Take 1 tablet (20 mg total) by mouth daily. 12/20/16   Susy Frizzle, MD  ipratropium (ATROVENT) 0.06 % nasal spray Place 2 sprays into both nostrils 4 (four) times daily.     Historical Provider, MD  levothyroxine (SYNTHROID, LEVOTHROID) 125 MCG tablet Take 125 mcg by mouth daily.    Historical Provider, MD  LORazepam (ATIVAN) 1 MG tablet Take 1 tablet (1 mg total) by mouth 2 (two) times daily. 02/18/17   [name], MD  nitroGLYCERIN (NITROSTAT) 0.4 MG SL tablet Place 0.4  mg under the tongue every 5 (five) minutes as needed. Chest Pain    Historical Provider, MD  OXYGEN Inhale 2 L into the lungs continuous. [place]    Historical Provider, MD  pilocarpine (PILOCAR) 1 % ophthalmic solution Place 1 drop into both eyes 3 (three) times daily.  09/26/15   Historical Provider, MD  Polyethyl Glycol-Propyl Glycol (SYSTANE) 0.4-0.3 % GEL Place 1-2 drops into the right eye 4 (four) times daily. Per wife sometimes he may use 3 drops in the right eye.     Historical Provider, MD  polyethylene glycol (MIRALAX / GLYCOLAX) packet Take 17 g by mouth daily as needed.     Historical Provider, MD  risperiDONE (RISPERDAL) 0.5 MG tablet TAKE (1) TABLET BY MOUTH TWICE DAILY. 03/06/17   Susy Frizzle, MD  saw palmetto 500 MG capsule Take 500 mg by mouth 2 (two) times daily.     Historical Provider,  MD    Physical Exam: Vitals:   04/04/17 0131 04/04/17 0200 04/04/17 0329 04/04/17 0450  BP:  125/73  111/65  Pulse:  91  (!) 105  Resp: (!) 26 (!) 22  (!) 26  Temp:      TempSrc:      SpO2: 98% 98% 92% 92%  Weight:      Height:          Constitutional: Elderly gentleman with increased WOB, no pallor, no diaphoresis.  Eyes: PERTLA, lids and conjunctivae normal ENMT: Mucous membranes are moist. Posterior pharynx clear of any exudate or lesions.   Neck: normal, supple, no masses, no thyromegaly Respiratory: Coarse rales bilaterally. Mild tachypnea and increased WOB. No pallor or cyanosis.   Cardiovascular: Rate ~80 and irregular. Trace pretibial edema. No significant JVD. Abdomen: No distension, no tenderness, no masses palpated. Bowel sounds normal.  Musculoskeletal: no clubbing / cyanosis. No joint deformity upper and lower extremities.    Skin: no significant rashes, lesions, ulcers. Warm, dry, well-perfused. Neurologic: No gross facial asymmetry, moving all extremities spontaneously. Patellar DTR's wnl bilaterally.   Psychiatric: Currently calm, just treated with Ativan 50m   IVP    Labs on Admission: I have personally reviewed following labs and imaging studies  CBC:  Recent Labs Lab 04/04/17 0152  WBC 13.0*  NEUTROABS 9.7*  HGB 12.2*  HCT 37.7*  MCV 95.2  PLT 2627  Basic Metabolic Panel:  Recent Labs Lab 04/04/17 0152  NA 141  K 3.2*  CL 101  CO2 31  GLUCOSE 118*  BUN 22*  CREATININE 1.00  CALCIUM 9.1   GFR: Estimated Creatinine Clearance: 50.8 mL/min (by C-G formula based on SCr of 1 mg/dL). Liver Function Tests: No results for input(s): AST, ALT, ALKPHOS, BILITOT, PROT, ALBUMIN in the last 168 hours. No results for input(s): LIPASE, AMYLASE in the last 168 hours. No results for input(s): AMMONIA in the last 168 hours. Coagulation Profile: No results for input(s): INR, PROTIME in the last 168 hours. Cardiac Enzymes: No results for input(s): CKTOTAL, CKMB, CKMBINDEX, TROPONINI in the last 168 hours. BNP (last 3 results) No results for input(s): PROBNP in the last 8760 hours. HbA1C: No results for input(s): HGBA1C in the last 72 hours. CBG: No results for input(s): GLUCAP in the last 168 hours. Lipid Profile: No results for input(s): CHOL, HDL, LDLCALC, TRIG, CHOLHDL, LDLDIRECT in the last 72 hours. Thyroid Function Tests: No results for input(s): TSH, T4TOTAL, FREET4, T3FREE, THYROIDAB in the last 72 hours. Anemia Panel: No results for input(s): VITAMINB12, FOLATE, FERRITIN, TIBC, IRON, RETICCTPCT in the last 72 hours. Urine analysis:    Component Value Date/Time   COLORURINE YELLOW 05/14/2011 1028   APPEARANCEUR CLEAR 05/14/2011 1028   LABSPEC 1.015 05/14/2011 1028   PHURINE 5.5 05/14/2011 1028   GLUCOSEU NEGATIVE 05/14/2011 1028   HGBUR NEGATIVE 05/14/2011 1028   BILIRUBINUR NEGATIVE 05/14/2011 1[place]05/29/2012 1028   PROTEINUR NEGATIVE 05/14/2011 1028   UROBILINOGEN 0.2 05/14/2011 1028   NITRITE NEGATIVE 05/14/2011 1028   LEUKOCYTESUR  05/14/2011 1028    NEGATIVE MICROSCOPIC NOT DONE ON URINES  WITH NEGATIVE PROTEIN, BLOOD, LEUKOCYTES, NITRITE, OR GLUCOSE <1000 mg/dL.   Sepsis Labs: _0 (procalcitonin:4,lacticidven:4) )No results found for this or any previous visit (from the past 240 hour(s)).   Radiological Exams on Admission: Dg Chest Port 1 View  Result Date: 04/04/2017 CLINICAL DATA:  Shortness of breath, congestion, cough, and wheezing. EXAM: PORTABLE CHEST 1 VIEW COMPARISON:  10/24/2015  FINDINGS: Cardiac pacemaker. Shallow inspiration with linear atelectasis in the lung bases. There patchy airspace infiltrates demonstrated in the mid and lower lung zones bilaterally, more prominent on the left. These changes may indicate edema or pneumonia. Bilateral hilar prominence is similar to previous study, probably representing prominent vascularity. No blunting of costophrenic angles. No pneumothorax. Cardiac enlargement without vascular congestion. Calcified, tortuous, and ectatic aorta. IMPRESSION: Bilateral mid and lower lung zone airspace infiltrates likely to represent pneumonia or edema. Atelectasis in the lung bases. Cardiac enlargement. Electronically Signed   By: Lucienne Capers M.D.   On: 04/04/2017 03:17    EKG: Independently reviewed. Atrial fibrillation, low-voltage QRS.   Assessment/Plan  1. Pneumonia  - Pt presents with 2 days of worsening cough and dyspnea, noted to have leukocytosis and bilateral consolidations on CXR consistent with PNA  - He has been started on empiric Levaquin in the ED  - Check sputum culture and strep pneumo antigen  - Continue empiric Levaquin    2. COPD with chronic hypoxic respiratory failure  - Pt currently oxygenating and mentating appropriately on his usual 3 Lpm via nasal canula  - No wheezing or obstructed breathing on admission  - Continue supplemental O2 and duoneb    3. Chronic atrial fibrillation - Pt in atrial fibrillation with controlled rate on admission - CHADS-VASc at least 3 (age x2, CHF) - Previously on warfarin,  now ASA 325 only d/t fall risk  - Continue ASA 325 mg qD   4. Chronic diastolic CHF  - Appears euvolemic on admission - TTE from 2011 with preserved EF, moderate TR, mild MR  - Managed at home with Lasix 20 mg qD; held for now   - Follow daily wts and I/O's, SLIV   5. Dementia with behavioral disturbance  - Continue Aricept - Continue Risperdal and prn Ativan   6. Hypokalemia  - Serum potassium is 3.2 on admission - Not appropriate for oral meds after IV Ativan in ED, given 30 mEq IV potassium and 1g IV mag    DVT prophylaxis: sq Lovenox  Code Status: DNR Family Communication: Children and caretaker updated at bedside Disposition Plan: Admit to med-surg Consults called: None Admission status: Inpatient    Vianne Bulls, MD [facility] Pager [phone]  If 7PM-7AM, please contact night-coverage [email or web address] Password [facility]  04/04/2017, 4:57 AM

## 2017-04-04 NOTE — Progress Notes (Deleted)
Cardiology Office Note   Date:  04/04/2017   ID:  Dustin Blankenship, DOB 22-Dec-1919, MRN [id number]  PCP:  Odette Fraction, MD  Cardiologist:   Jory Sims, NP   No chief complaint on file.     History of Present Illness: Dustin Blankenship is a 83 y.o. male who presents for ***    Past Medical History:  Diagnosis Date  . Atrial fib/flutter, transient    not on warfarin due to risk of falls ,on aspirin for prophylaxis  . CHF (congestive heart failure) (HCC)    right heart failure and pulm htn due to chronic hypoxia from COPD  . COPD (chronic obstructive pulmonary disease) ([place])   . GERD (gastroesophageal reflux disease)   . Glaucoma   . Glaucoma   . Gout   . Hypothyroid   . Pacemaker 09/20/2010    ST Jude Accent DR RF device  MODEL #[id number],SERIAL #[id number]  . Pulmonary HTN (HCC)    DUE TO CHRONIC HYPOXIA   FROM COPD  . Sinus node dysfunction ([place])    Dual-chamber permanent pacermaker ST JUDE    Past Surgical History:  Procedure Laterality Date  . COLONOSCOPY W/ POLYPECTOMY    . DOPPLER ECHOCARDIOGRAPHY  07/25/2011   EF =50-55%  . ESOPHAGEAL DILATION N/A 02/24/2015   Procedure: ESOPHAGEAL DILATION;  Surgeon: [name], MD;  [facility];  Service: Endoscopy;  Laterality: N/A;  . ESOPHAGOGASTRODUODENOSCOPY N/A 02/24/2015   Procedure: ESOPHAGOGASTRODUODENOSCOPY (EGD);  Surgeon: [name], MD;  [facility];  Service: Endoscopy;  Laterality: N/A;  10:00  . INSERT / REPLACE / REMOVE PACEMAKER  09/20/2010   ST JUDE ACCENT-dual chamber ;    . LUNG BIOPSY    . NM MYOCAR PERF WALL MOTION  07/25/2011     No current facility-administered medications for this visit.    No current outpatient prescriptions on file.   Facility-Administered Medications Ordered in Other Visits  Medication Dose Route Frequency Provider Last Rate Last Dose  . acetaminophen (TYLENOL) tablet 650 mg  650 mg Oral Q6H PRN Vianne Bulls, MD      . alfuzosin (UROXATRAL)  24 hr tablet 10 mg  10 mg Oral Q breakfast Vianne Bulls, MD      . allopurinol (ZYLOPRIM) tablet 300 mg  300 mg Oral Daily Vianne Bulls, MD      . aspirin EC tablet 325 mg  325 mg Oral Daily Timothy S Opyd, MD      . brimonidine-timolol (COMBIGAN) 0.2-0.5 % ophthalmic solution 1 drop  1 drop Left Eye Q12H Ilene Qua Opyd, MD      . docusate sodium (COLACE) capsule 100 mg  100 mg Oral BID PRN Vianne Bulls, MD      . donepezil (ARICEPT) tablet 10 mg  10 mg Oral QHS Timothy S Opyd, MD      . enoxaparin (LOVENOX) injection 40 mg  40 mg Subcutaneous Q24H Timothy S Opyd, MD      . escitalopram (LEXAPRO) tablet 10 mg  10 mg Oral QHS Timothy S Opyd, MD      . hydroxypropyl methylcellulose / hypromellose (ISOPTO TEARS / GONIOVISC) 2.5 % ophthalmic solution 1 drop  1 drop Left Eye QID PRN Ilene Qua Opyd, MD      . ipratropium-albuterol (DUONEB) 0.5-2.5 (3) MG/3ML nebulizer solution 3 mL  3 mL Nebulization Q6H Timothy S Opyd, MD      . ipratropium-albuterol (DUONEB) 0.5-2.5 (3) MG/3ML nebulizer solution  3 mL  3 mL Nebulization Q4H PRN Vianne Bulls, MD      . Derrill Memo ON 04/05/2017] levofloxacin (LEVAQUIN) IVPB 750 mg  750 mg Intravenous Q24H Ilene Qua Opyd, MD      . levothyroxine (SYNTHROID, LEVOTHROID) tablet 125 mcg  125 mcg Oral QAC breakfast Ilene Qua Opyd, MD      . LORazepam (ATIVAN) tablet 1 mg  1 mg Oral BID Ilene Qua Opyd, MD      . magnesium sulfate IVPB 1 g 25 mL  1 g Intravenous Once Vianne Bulls, MD      . pilocarpine (PILOCAR) 1 % ophthalmic solution 1 drop  1 drop Both Eyes TID Ilene Qua Opyd, MD      . polyethylene glycol (MIRALAX / GLYCOLAX) packet 17 g  17 g Oral Daily PRN Ilene Qua Opyd, MD      . polyethylene glycol 0.4% and propylene glycol 0.3% (SYSTANE) ophthalmic gel   Right Eye QID Timothy S Opyd, MD      . potassium chloride 30 mEq in sodium chloride 0.9 % 265 mL (KCL MULTIRUN) IVPB  30 mEq Intravenous Once Ilene Qua Opyd, MD      . risperiDONE (RISPERDAL) tablet 0.5 mg  0.5  mg Oral BID Vianne Bulls, MD        Allergies:   Morphine and related; Multaq [dronedarone]; Penicillins; Latex; Sulfonamide derivatives; and Testosterone    Social History:  The patient  reports that he quit smoking about 63 years ago. His smoking use included Cigarettes. He started smoking about 65 years ago. He has a 54.00 pack-year smoking history. He has never used smokeless tobacco. He reports that he does not drink alcohol or use drugs.   Family History:  The patient's family history is not on file.    ROS: All other systems are reviewed and negative. Unless otherwise mentioned in H&P    PHYSICAL EXAM: VS:  There were no vitals taken for this visit. , BMI There is no height or weight on file to calculate BMI. GEN: Well nourished, well developed, in no acute distress HEENT: normal Neck: no JVD, carotid bruits, or masses Cardiac: ***RRR; no murmurs, rubs, or gallops,no edema  Respiratory:  clear to auscultation bilaterally, normal work of breathing GI: soft, nontender, nondistended, + BS MS: no deformity or atrophy Skin: warm and dry, no rash Neuro:  Strength and sensation are intact Psych: euthymic mood, full affect   EKG:  EKG {ACTION; IS/IS HYW:[id number]} ordered today. The ekg ordered today demonstrates ***   Recent Labs: 11/26/2016: ALT 11; TSH 1.09 04/04/2017: BUN 22; Creatinine, Ser 1.00; Hemoglobin 12.2; Platelets 200; Potassium 3.2; Sodium 141    Lipid Panel    Component Value Date/Time   CHOL  06/04/2010 0522    107        ATP III CLASSIFICATION:  <200     mg/dL   Desirable  200-239  mg/dL   Borderline High  >=240    mg/dL   High          TRIG 107 06/04/2010 0522   HDL 31 (L) 06/04/2010 0522   CHOLHDL 3.5 06/04/2010 0522   VLDL 21 06/04/2010 0522   LDLCALC  06/04/2010 0522    55        Total Cholesterol/HDL:CHD Risk Coronary Heart Disease Risk Table                     Men   Women  1/2  Average Risk   3.4   3.3  Average Risk       5.0   4.4  2 X  Average Risk   9.6   7.1  3 X Average Risk  23.4   11.0        Use the calculated Patient Ratio above and the CHD Risk Table to determine the patient's CHD Risk.        ATP III CLASSIFICATION (LDL):  <100     mg/dL   Optimal  100-129  mg/dL   Near or Above                    Optimal  130-159  mg/dL   Borderline  160-189  mg/dL   High  >190     mg/dL   Very High      Wt Readings from Last 3 Encounters:  04/04/17 206 lb (93.4 kg)  04/01/17 206 lb (93.4 kg)  03/18/17 206 lb (93.4 kg)      Other studies Reviewed: Additional studies/ records that were reviewed today include: ***. Review of the above records demonstrates: ***   ASSESSMENT AND PLAN:  1.  ***   Current medicines are reviewed at length with the patient today.    Labs/ tests ordered today include: *** No orders of the defined types were placed in this encounter.    Disposition:   FU with   Signed, Jory Sims, NP  04/04/2017 7:23 AM    [facility] [address] [place], [place] [address] Phone: [phone]; Fax: [phone]

## 2017-04-04 NOTE — Care Management Note (Signed)
Case Management Note  Patient Details  Name: Dustin Blankenship MRN: [id number] Date of Birth: [birth year]  Subjective/Objective:     Adm with CAP. From home with 24/7 caregivers. 2 caregivers at bedside offering information.  Patient has dementia. Caregivers continuous oxygen at home and that patient is active with [facility].              Action/Plan: [place] home with caregivers. No CM needs anticipated.    Expected Discharge Date:       04/06/2017           Expected Discharge Plan:  Home/Self Care  In-House Referral:     Discharge planning Services  CM Consult  Post Acute Care Choice:    Choice offered to:  NA  DME Arranged:    DME Agency:     HH Arranged:    HH Agency:     Status of Service:  Completed, signed off  If discussed at [employer] of Stay Meetings, dates discussed:    Additional Comments:  Devaney Segers, Chauncey Reading, RN 04/04/2017, 12:37 PM

## 2017-04-04 NOTE — Patient Outreach (Signed)
[facility]) Care Management  04/04/2017  Dustin Blankenship February 26, 1920 [id number]  Call received from Ms. [name], CNA and primary Chiropodist for [name]. [name] reports that she assisted in having [name] transported to the ED early this morning because he was becoming "restless and more short of breath." Dustin Blankenship was admitted to [facility] and is being treated for community acquired pneumonia.   I notified Dr. Jenna Luo of Dustin Blankenship symptoms and I secured a next day cardiology appointment with Dustin Blankenship at [facility] for today @ 3e:30pm, when I visited Dustin Blankenship at home yesterday, noting his peripheral edema, abdominal swelling, fatigue, tachycardia, lower bp, and generalized decline in condition. I called the office today to ask for that appointment to be cancelled.   Dr. Dennard Schaumann and Dustin Blankenship have been notified of Dustin Blankenship admission.   Plan: I will follow Dustin Blankenship progress closely and will enroll him in a transition of care plan at hospital discharge. I can see Dustin Blankenship at home next week upon discharge within 48 hours.    [facility] Management  [phone]

## 2017-04-04 NOTE — ED Triage Notes (Signed)
Pt in by ems from home with family reports that pt looks dehydrated.   Pt denies complaints

## 2017-04-04 NOTE — ED Notes (Addendum)
Caretaker reports that the pt was seen here a couple of days ago for constipation. Since then, pt has been congested, had cough and has been wheezing. Caretaker states that the pt has not been drinking much.    Caretaker also states that pt has been somewhat confused at times.   Pt is aware that we need a urine specimen.

## 2017-04-04 NOTE — Progress Notes (Signed)
Patient admitted to the hospital earlier this morning by Dr. Myna Hidalgo  Patient seen and examined. He has bilateral coarse breath sounds, 1+ edema, he still feels short of breath  Patient is admitted to the hospital with acute on chronic respiratory failure and pneumonia. He is on intravenous antibiotics. Continue current treatments. Request speech therapy consult to evaluate for possible aspiration.  Dustin Blankenship

## 2017-04-05 LAB — CBC
HCT: 36.9 % — ABNORMAL LOW (ref 39.0–52.0)
HEMOGLOBIN: 11.7 g/dL — AB (ref 13.0–17.0)
MCH: 30.7 pg (ref 26.0–34.0)
MCHC: 31.7 g/dL (ref 30.0–36.0)
MCV: 96.9 fL (ref 78.0–100.0)
PLATELETS: 202 10*3/uL (ref 150–400)
RBC: 3.81 MIL/uL — AB (ref 4.22–5.81)
RDW: 14.7 % (ref 11.5–15.5)
WBC: 11.7 10*3/uL — AB (ref 4.0–10.5)

## 2017-04-05 LAB — BASIC METABOLIC PANEL
Anion gap: 7 (ref 5–15)
BUN: 21 mg/dL — ABNORMAL HIGH (ref 6–20)
CO2: 31 mmol/L (ref 22–32)
Calcium: 8.8 mg/dL — ABNORMAL LOW (ref 8.9–10.3)
Chloride: 101 mmol/L (ref 101–111)
Creatinine, Ser: 0.88 mg/dL (ref 0.61–1.24)
GFR calc Af Amer: >60 mL/min (ref >60)
GFR calc non Af Amer: >60 mL/min (ref >60)
Glucose, Bld: 126 mg/dL — ABNORMAL HIGH (ref 65–99)
Potassium: 3.6 mmol/L (ref 3.5–5.1)
Sodium: 139 mmol/L (ref 135–145)

## 2017-04-05 MED ORDER — LORAZEPAM 1 MG PO TABS
1.0000 mg | ORAL_TABLET | Freq: Once | ORAL | Status: AC
  Administered 2017-04-05: 1 mg via ORAL

## 2017-04-05 MED ORDER — LORAZEPAM 1 MG PO TABS
1.0000 mg | ORAL_TABLET | Freq: Four times a day (QID) | ORAL | Status: DC | PRN
Start: 2017-04-05 — End: 2017-04-06
  Administered 2017-04-06: 1 mg via ORAL
  Filled 2017-04-05: qty 1

## 2017-04-05 MED ORDER — BISACODYL 5 MG PO TBEC
10.0000 mg | DELAYED_RELEASE_TABLET | Freq: Every day | ORAL | Status: DC
  Administered 2017-04-05 – 2017-04-06 (×2): 10 mg via ORAL
  Filled 2017-04-05 (×2): qty 2

## 2017-04-05 NOTE — Progress Notes (Signed)
Patient again becoming agitated and yanking out IV tubing (like yesterday morning 24 hours ago).  Looks like they gave him 82m ativan PO x1 then.  Will try this again now.

## 2017-04-05 NOTE — Progress Notes (Signed)
Pt has become combative and very agitated.  Dr notified and prescribed ativan.

## 2017-04-05 NOTE — Progress Notes (Signed)
PROGRESS NOTE    Dustin Blankenship  JHE:[id number] DOB: 1920/09/10 DOA: 04/04/2017 PCP: Odette Fraction, MD    Brief Narrative:  83 year old male with a history of COPD and chronic respiratory failure presents to the hospital with coughing, shortness of breath and increased confusion. Found to have community-acquired pneumonia and started on intravenous antibiotics.   Assessment & Plan:   Principal Problem:   CAP (community acquired pneumonia) Active Problems:   COPD (chronic obstructive pulmonary disease) (HCC)   Atrial fibrillation (HCC)   CAD (coronary artery disease) - suspected based on abnormal nuclear stress test   Dementia with behavioral disturbance   Chronic diastolic CHF (congestive heart failure) ([place])   1. Community-acquired pneumonia. Currently on Levaquin. Fevers appears to be improving. Leukocytosis improving. Continue current treatments.  2. COPD with chronic respiratory failure. No wheezing at this time. Continue on bronchodilators and supportive treatments.  3. Chronic atrial fibrillation. Was previously on warfarin, but this was discontinued due to fall risk. Continue on aspirin. Rate is controlled.  4. Chronic diastolic CHF. Appears to be euvolemic at this time. Hold off on further Lasix for now.  5. Dementia with behavioral disturbances. Patient appears to be sundowning and mental status is worse at night. He is on Risperdal. We'll continue Ativan as needed. Continue Aricept.  6. Constipation. Abdominal x-ray shows rectal stool. Abdomen is distended. We'll continue with bowel regimen.   DVT prophylaxis: lovenox Code Status: DNR Family Communication: discussed with family at bedside Disposition Plan: discharge home when improved. He has 24 hour nursing care at home   Consultants:     Procedures:     Antimicrobials:   Levofloxacin 4/20>>    Subjective: Breathing slowly improving. Cough is non productive, still has trouble completing sentences  due to shortness of breath  Objective: Vitals:   04/04/17 2238 04/05/17 0637 04/05/17 0716 04/05/17 1348  BP: (!) 102/59 131/71    Pulse: 85 90    Resp: 18 18    Temp: 97.6 F (36.4 C) 98.1 F (36.7 C)    TempSrc: Oral Oral    SpO2: 97% 96% 94% 97%  Weight:      Height:       No intake or output data in the 24 hours ending 04/05/17 1856 Filed Weights   04/04/17 0127  Weight: 93.4 kg (206 lb)    Examination:  General exam: Appears calm and comfortable  Respiratory system: coarse breath sounds bilaterally, mildly increased resp effort Cardiovascular system: S1 & S2 heard, RRR. No JVD, murmurs, rubs, gallops or clicks. No pedal edema. Gastrointestinal system: Abdomen is distended, soft and nontender. No organomegaly or masses felt. Normal bowel sounds heard. Central nervous system:  No focal neurological deficits. Extremities: Symmetric 5 x 5 power. Skin: No rashes, lesions or ulcers Psychiatry: confused but pleasant    Data Reviewed: I have personally reviewed following labs and imaging studies  CBC:  Recent Labs Lab 04/04/17 0152 04/05/17 0615  WBC 13.0* 11.7*  NEUTROABS 9.7*  --   HGB 12.2* 11.7*  HCT 37.7* 36.9*  MCV 95.2 96.9  PLT 200 185   Basic Metabolic Panel:  Recent Labs Lab 04/04/17 0152 04/05/17 0615  NA 141 139  K 3.2* 3.6  CL 101 101  CO2 31 31  GLUCOSE 118* 126*  BUN 22* 21*  CREATININE 1.00 0.88  CALCIUM 9.1 8.8*   GFR: Estimated Creatinine Clearance: 57.7 mL/min (by C-G formula based on SCr of 0.88 mg/dL). Liver Function Tests: No results for input(s):  AST, ALT, ALKPHOS, BILITOT, PROT, ALBUMIN in the last 168 hours. No results for input(s): LIPASE, AMYLASE in the last 168 hours. No results for input(s): AMMONIA in the last 168 hours. Coagulation Profile: No results for input(s): INR, PROTIME in the last 168 hours. Cardiac Enzymes: No results for input(s): CKTOTAL, CKMB, CKMBINDEX, TROPONINI in the last 168 hours. BNP (last 3  results) No results for input(s): PROBNP in the last 8760 hours. HbA1C: No results for input(s): HGBA1C in the last 72 hours. CBG: No results for input(s): GLUCAP in the last 168 hours. Lipid Profile: No results for input(s): CHOL, HDL, LDLCALC, TRIG, CHOLHDL, LDLDIRECT in the last 72 hours. Thyroid Function Tests: No results for input(s): TSH, T4TOTAL, FREET4, T3FREE, THYROIDAB in the last 72 hours. Anemia Panel: No results for input(s): VITAMINB12, FOLATE, FERRITIN, TIBC, IRON, RETICCTPCT in the last 72 hours. Sepsis Labs: No results for input(s): PROCALCITON, LATICACIDVEN in the last 168 hours.  No results found for this or any previous visit (from the past 240 hour(s)).       Radiology Studies: Dg Chest Port 1 View  Result Date: 04/04/2017 CLINICAL DATA:  Shortness of breath, congestion, cough, and wheezing. EXAM: PORTABLE CHEST 1 VIEW COMPARISON:  10/24/2015 FINDINGS: Cardiac pacemaker. Shallow inspiration with linear atelectasis in the lung bases. There patchy airspace infiltrates demonstrated in the mid and lower lung zones bilaterally, more prominent on the left. These changes may indicate edema or pneumonia. Bilateral hilar prominence is similar to previous study, probably representing prominent vascularity. No blunting of costophrenic angles. No pneumothorax. Cardiac enlargement without vascular congestion. Calcified, tortuous, and ectatic aorta. IMPRESSION: Bilateral mid and lower lung zone airspace infiltrates likely to represent pneumonia or edema. Atelectasis in the lung bases. Cardiac enlargement. Electronically Signed   By: Lucienne Capers M.D.   On: 04/04/2017 03:17        Scheduled Meds: . alfuzosin  10 mg Oral Q breakfast  . allopurinol  300 mg Oral Daily  . aspirin  325 mg Oral Daily  . bisacodyl  10 mg Oral Daily  . brimonidine  1 drop Left Eye BID   And  . timolol  1 drop Left Eye BID  . donepezil  10 mg Oral QHS  . enoxaparin (LOVENOX) injection  40 mg  Subcutaneous Q24H  . escitalopram  10 mg Oral QHS  . guaiFENesin  1,200 mg Oral BID  . ipratropium-albuterol  3 mL Nebulization TID  . levothyroxine  125 mcg Oral QAC breakfast  . milk and molasses  1 enema Rectal Once  . pilocarpine  1 drop Both Eyes TID  . polyethylene glycol  17 g Oral Daily  . risperiDONE  0.5 mg Oral BID   Continuous Infusions: . levofloxacin (LEVAQUIN) IV    . magnesium sulfate 1 - 4 g bolus IVPB       LOS: 1 day    Time spent: 62mns    Dustin Filley, MD [facility] Pager [phone] If 7PM-7AM, please contact night-coverage [email or web address] Password [email or web address]4/21/2018, 6:56 PM

## 2017-04-06 LAB — URINALYSIS, ROUTINE W REFLEX MICROSCOPIC
Bacteria, UA: NONE SEEN
GLUCOSE, UA: NEGATIVE mg/dL
HGB URINE DIPSTICK: NEGATIVE
Ketones, ur: NEGATIVE mg/dL
Leukocytes, UA: NEGATIVE
Nitrite: NEGATIVE
PH: 5 (ref 5.0–8.0)
Protein, ur: 30 mg/dL — AB
SPECIFIC GRAVITY, URINE: 1.029 (ref 1.005–1.030)

## 2017-04-06 LAB — BASIC METABOLIC PANEL
Anion gap: 7 (ref 5–15)
BUN: 20 mg/dL (ref 6–20)
CHLORIDE: 99 mmol/L — AB (ref 101–111)
CO2: 33 mmol/L — ABNORMAL HIGH (ref 22–32)
Calcium: 8.9 mg/dL (ref 8.9–10.3)
Creatinine, Ser: 0.82 mg/dL (ref 0.61–1.24)
GFR calc non Af Amer: >60 mL/min (ref >60)
Glucose, Bld: 100 mg/dL — ABNORMAL HIGH (ref 65–99)
POTASSIUM: 3.8 mmol/L (ref 3.5–5.1)
SODIUM: 139 mmol/L (ref 135–145)

## 2017-04-06 LAB — CBC
HEMATOCRIT: 35.9 % — AB (ref 39.0–52.0)
Hemoglobin: 11.7 g/dL — ABNORMAL LOW (ref 13.0–17.0)
MCH: 31.7 pg (ref 26.0–34.0)
MCHC: 32.6 g/dL (ref 30.0–36.0)
MCV: 97.3 fL (ref 78.0–100.0)
PLATELETS: 223 10*3/uL (ref 150–400)
RBC: 3.69 MIL/uL — AB (ref 4.22–5.81)
RDW: 14.5 % (ref 11.5–15.5)
WBC: 9.1 10*3/uL (ref 4.0–10.5)

## 2017-04-06 MED ORDER — LEVOFLOXACIN 750 MG PO TABS: 750.0000 mg | ORAL_TABLET | Freq: Every day | ORAL | 0 refills | Status: AC

## 2017-04-06 MED ORDER — BISACODYL 5 MG PO TBEC: 10.0000 mg | DELAYED_RELEASE_TABLET | Freq: Every day | ORAL | 0 refills | Status: AC | PRN

## 2017-04-06 MED ORDER — PREDNISONE 10 MG PO TABS: ORAL_TABLET | ORAL | 0 refills | Status: DC

## 2017-04-06 MED ORDER — ALBUTEROL SULFATE (2.5 MG/3ML) 0.083% IN NEBU: 2.5000 mg | INHALATION_SOLUTION | Freq: Three times a day (TID) | RESPIRATORY_TRACT | 12 refills | Status: AC

## 2017-04-06 MED ORDER — GUAIFENESIN ER 600 MG PO TB12: 1200.0000 mg | ORAL_TABLET | Freq: Two times a day (BID) | ORAL | 0 refills | Status: AC

## 2017-04-06 NOTE — Discharge Summary (Signed)
Physician Discharge Summary  Dustin Blankenship PPI:[id number] DOB: April 01, 1920 DOA: 04/04/2017  PCP: Odette Fraction, MD  Admit date: 04/04/2017 Discharge date: 04/06/2017  Admitted From: Home Disposition: Home  Recommendations for Outpatient Follow-up:  1. Follow up with PCP in 1-2 weeks 2. Please obtain BMP/CBC in one week  Home Health:Home health physical therapy Equipment/Devices:Nebulizer, oxygen at 2 L  Discharge Condition:Stable CODE STATUS:DO NOT RESUSCITATE Diet recommendation: Heart Healthy   Brief/Interim Summary: 83 year old male with a history of COPD and chronic respiratory failure presents to the hospital with coughing, shortness of breath and increased confusion. Found to have community-acquired pneumonia and started on intravenous antibiotics.  Discharge Diagnoses:  Principal Problem:   CAP (community acquired pneumonia) Active Problems:   COPD (chronic obstructive pulmonary disease) ([place])   Atrial fibrillation (HCC)   CAD (coronary artery disease) - suspected based on abnormal nuclear stress test   Dementia with behavioral disturbance   Chronic diastolic CHF (congestive heart failure) ([place])  1. Community-acquired pneumonia. Patient was treated with Levaquin. Fever resolved. Leukocytosis resolved. He's been transitioned to oral Levaquin to complete his course.  2. COPD with chronic respiratory failure. Treated with bronchodilators. Placed on prednisone taper on discharge. Wheezing has resolved the time of discharge.  3. Chronic atrial fibrillation. Was previously on warfarin, but this was discontinued due to fall risk. Continue on aspirin. Rate is controlled.  4. Chronic diastolic CHF. Appears to be euvolemic at this time. Resume home dose of Lasix on discharge.  5. Dementia with behavioral disturbances. Patient appears to be sundowning and mental status is worse at night. He is on Risperdal. We'll continue Ativan as needed. Continue Aricept.  6.  Constipation. Abdominal x-ray shows rectal stool. Abdomen is distended. He was started on a bowel regimen with good bowel movements. He'll be continued on the same at home.  Discharge Instructions  Discharge Instructions    DME Nebulizer machine    Complete by:  As directed    Patient needs a nebulizer to treat with the following condition:  COPD (chronic obstructive pulmonary disease) (HCC)   Diet - low sodium heart healthy    Complete by:  As directed    Increase activity slowly    Complete by:  As directed      Allergies as of 04/06/2017      Reactions   Morphine And Related    Multaq [dronedarone]    Penicillins Other (See Comments)   Unknown. Patient received zosyn this am at 0415(10/24/15) without any problems Has patient had a PCN reaction causing immediate rash, facial/tongue/throat swelling, SOB or lightheadedness with hypotension: Unknown Has patient had a PCN reaction causing severe rash involving mucus membranes or skin necrosis: Unknown Has patient had a PCN reaction that required hospitalization Unknown Has patient had a PCN reaction occurring within the last 10 years: No If all of the above answers are "NO", then may p   Latex Rash   Sulfonamide Derivatives Rash   Testosterone Rash   The cream form causes a rash when rubbed on skin.       Medication List    TAKE these medications   acetaminophen 500 MG tablet Commonly known as:  TYLENOL Take 1,000 mg by mouth daily.   albuterol (2.5 MG/3ML) 0.083% nebulizer solution Commonly known as:  PROVENTIL Take 3 mLs (2.5 mg total) by nebulization 3 (three) times daily.   alfuzosin 10 MG 24 hr tablet Commonly known as:  UROXATRAL Take 1 tablet (10 mg total) by mouth daily with  breakfast.   allopurinol 300 MG tablet Commonly known as:  ZYLOPRIM Take 300 mg by mouth daily.   aspirin 325 MG EC tablet Take 325 mg by mouth daily.   bisacodyl 5 MG EC tablet Commonly known as:  DULCOLAX Take 2 tablets (10 mg total)  by mouth daily as needed for moderate constipation.   Carboxymethylcellulose Sodium 0.25 % Soln Place 1 drop into the left eye 4 (four) times daily as needed.   COMBIGAN 0.2-0.5 % ophthalmic solution Generic drug:  brimonidine-timolol Place 1 drop into the left eye every 12 (twelve) hours. Left Eye   docusate sodium 100 MG capsule Commonly known as:  COLACE Take 1 capsule (100 mg total) by mouth 2 (two) times daily as needed for mild constipation.   donepezil 10 MG tablet Commonly known as:  ARICEPT Take 10 mg by mouth at bedtime.   escitalopram 10 MG tablet Commonly known as:  LEXAPRO Take 10 mg by mouth at bedtime.   furosemide 20 MG tablet Commonly known as:  LASIX Take 1 tablet (20 mg total) by mouth daily.   guaiFENesin 600 MG 12 hr tablet Commonly known as:  MUCINEX Take 2 tablets (1,200 mg total) by mouth 2 (two) times daily.   ipratropium 0.06 % nasal spray Commonly known as:  ATROVENT Place 2 sprays into both nostrils 4 (four) times daily.   levofloxacin 750 MG tablet Commonly known as:  LEVAQUIN Take 1 tablet (750 mg total) by mouth daily.   levothyroxine 125 MCG tablet Commonly known as:  SYNTHROID, LEVOTHROID Take 125 mcg by mouth daily.   LORazepam 1 MG tablet Commonly known as:  ATIVAN Take 1 tablet (1 mg total) by mouth 2 (two) times daily.   nitroGLYCERIN 0.4 MG SL tablet Commonly known as:  NITROSTAT Place 0.4 mg under the tongue every 5 (five) minutes as needed. Chest Pain   OXYGEN Inhale 2 L into the lungs continuous. [employer]   pilocarpine 1 % ophthalmic solution Commonly known as:  PILOCAR Place 1 drop into both eyes 3 (three) times daily.   polyethylene glycol packet Commonly known as:  MIRALAX / GLYCOLAX Take 17 g by mouth daily as needed for mild constipation.   predniSONE 10 MG tablet Commonly known as:  DELTASONE Take 86m po daily for 2 days then 354mdaily for 2 days then 2040maily for 2 days then 30m67mily for 2 days then  stop   risperiDONE 0.5 MG tablet Commonly known as:  RISPERDAL TAKE (1) TABLET BY MOUTH TWICE DAILY.   saw palmetto 500 MG capsule Take 500 mg by mouth 2 (two) times daily.   SYSTANE 0.4-0.3 % Gel ophthalmic gel Generic drug:  Polyethyl Glycol-Propyl Glycol Place 1-2 drops into the right eye 4 (four) times daily. Per wife sometimes he may use 3 drops in the right eye.            Durable Medical Equipment        Start     Ordered   04/06/17 0000  DME Nebulizer machine    Question:  Patient needs a nebulizer to treat with the following condition  Answer:  COPD (chronic obstructive pulmonary disease) (HCC)[place]04/22/18 1218      Allergies  Allergen Reactions  . Morphine And Related   . Multaq [Dronedarone]   . Penicillins Other (See Comments)    Unknown. Patient received zosyn this am at 0415(10/24/15) without any problems Has patient had a PCN reaction causing immediate rash, facial/tongue/throat  swelling, SOB or lightheadedness with hypotension: Unknown Has patient had a PCN reaction causing severe rash involving mucus membranes or skin necrosis: Unknown Has patient had a PCN reaction that required hospitalization Unknown Has patient had a PCN reaction occurring within the last 10 years: No If all of the above answers are "NO", then may p  . Latex Rash  . Sulfonamide Derivatives Rash  . Testosterone Rash    The cream form causes a rash when rubbed on skin.     Consultations:     Procedures/Studies: Dg Abdomen 1 View  Result Date: 04/01/2017 CLINICAL DATA:  Constipation EXAM: ABDOMEN - 1 VIEW COMPARISON:  None. FINDINGS: Rectal stool without over distension; no generalized stool retention. Normal small bowel gas pattern. No concerning mass effect or calcification when accounting for pelvic phleboliths. Lumbar disc degeneration with dextroscoliosis. IMPRESSION: 1. Rectal stool without generalized stool retention. 2. Normal bowel gas pattern. Electronically Signed   By:  Monte Fantasia M.D.   On: 04/01/2017 10:44   Dg Chest Port 1 View  Result Date: 04/04/2017 CLINICAL DATA:  Shortness of breath, congestion, cough, and wheezing. EXAM: PORTABLE CHEST 1 VIEW COMPARISON:  10/24/2015 FINDINGS: Cardiac pacemaker. Shallow inspiration with linear atelectasis in the lung bases. There patchy airspace infiltrates demonstrated in the mid and lower lung zones bilaterally, more prominent on the left. These changes may indicate edema or pneumonia. Bilateral hilar prominence is similar to previous study, probably representing prominent vascularity. No blunting of costophrenic angles. No pneumothorax. Cardiac enlargement without vascular congestion. Calcified, tortuous, and ectatic aorta. IMPRESSION: Bilateral mid and lower lung zone airspace infiltrates likely to represent pneumonia or edema. Atelectasis in the lung bases. Cardiac enlargement. Electronically Signed   By: Lucienne Capers M.D.   On: 04/04/2017 03:17       Subjective: Breathing improving. He still has some cough which is nonproductive.  Discharge Exam: Vitals:   04/06/17 0524 04/06/17 1430  BP: 100/74 (!) 114/57  Pulse: 86 86  Resp:  16  Temp: 99.5 F (37.5 C)    Vitals:   04/06/17 0524 04/06/17 0817 04/06/17 1430 04/06/17 1434  BP: 100/74  (!) 114/57   Pulse: 86  86   Resp:   16   Temp: 99.5 F (37.5 C)     TempSrc: Axillary     SpO2: 100% 92% 93% 93%  Weight:      Height:        General: Pt is alert, awake, not in acute distress Cardiovascular: RRR, S1/S2 +, no rubs, no gallops Respiratory: CTA bilaterally, no wheezing, no rhonchi Abdominal: Soft, NT, ND, bowel sounds + Extremities: no edema, no cyanosis    The results of significant diagnostics from this hospitalization (including imaging, microbiology, ancillary and laboratory) are listed below for reference.     Microbiology: Recent Results (from the past 240 hour(s))  Culture, respiratory (NON-Expectorated)     Status: None  (Preliminary result)   Collection Time: 04/05/17  9:30 PM  Result Value Ref Range Status   Specimen Description SPUTUM  Final   Special Requests NONE  Final   Gram Stain   Final    ABUNDANT WBC PRESENT, PREDOMINANTLY PMN NO ORGANISMS SEEN Performed at [facility], [address] [place], [place] [address]    Culture PENDING  Incomplete   Report Status PENDING  Incomplete     Labs: BNP (last 3 results) No results for input(s): BNP in the last 8760 hours. Basic Metabolic Panel:  Recent Labs Lab  04/04/17 0152 04/05/17 0615 04/06/17 0635  NA 141 139 139  K 3.2* 3.6 3.8  CL 101 101 99*  CO2 31 31 33*  GLUCOSE 118* 126* 100*  BUN 22* 21* 20  CREATININE 1.00 0.88 0.82  CALCIUM 9.1 8.8* 8.9   Liver Function Tests: No results for input(s): AST, ALT, ALKPHOS, BILITOT, PROT, ALBUMIN in the last 168 hours. No results for input(s): LIPASE, AMYLASE in the last 168 hours. No results for input(s): AMMONIA in the last 168 hours. CBC:  Recent Labs Lab 04/04/17 0152 04/05/17 0615 04/06/17 0635  WBC 13.0* 11.7* 9.1  NEUTROABS 9.7*  --   --   HGB 12.2* 11.7* 11.7*  HCT 37.7* 36.9* 35.9*  MCV 95.2 96.9 97.3  PLT 200 202 223   Cardiac Enzymes: No results for input(s): CKTOTAL, CKMB, CKMBINDEX, TROPONINI in the last 168 hours. BNP: Invalid input(s): POCBNP CBG: No results for input(s): GLUCAP in the last 168 hours. D-Dimer No results for input(s): DDIMER in the last 72 hours. Hgb A1c No results for input(s): HGBA1C in the last 72 hours. Lipid Profile No results for input(s): CHOL, HDL, LDLCALC, TRIG, CHOLHDL, LDLDIRECT in the last 72 hours. Thyroid function studies No results for input(s): TSH, T4TOTAL, T3FREE, THYROIDAB in the last 72 hours.  Invalid input(s): FREET3 Anemia work up No results for input(s): VITAMINB12, FOLATE, FERRITIN, TIBC, IRON, RETICCTPCT in the last 72 hours. Urinalysis    Component Value Date/Time   COLORURINE AMBER (A) 04/06/2017 0100    APPEARANCEUR CLEAR 04/06/2017 0100   LABSPEC 1.029 04/06/2017 0100   PHURINE 5.0 04/06/2017 0100   GLUCOSEU NEGATIVE 04/06/2017 0100   HGBUR NEGATIVE 04/06/2017 0100   BILIRUBINUR SMALL (A) 04/06/2017 0100   KETONESUR NEGATIVE 04/06/2017 0100   PROTEINUR 30 (A) 04/06/2017 0100   UROBILINOGEN 0.2 05/14/2011 1028   NITRITE NEGATIVE 04/06/2017 0100   LEUKOCYTESUR NEGATIVE 04/06/2017 0100   Sepsis Labs Invalid input(s): PROCALCITONIN,  WBC,  LACTICIDVEN Microbiology Recent Results (from the past 240 hour(s))  Culture, respiratory (NON-Expectorated)     Status: None (Preliminary result)   Collection Time: 04/05/17  9:30 PM  Result Value Ref Range Status   Specimen Description SPUTUM  Final   Special Requests NONE  Final   Gram Stain   Final    ABUNDANT WBC PRESENT, PREDOMINANTLY PMN NO ORGANISMS SEEN Performed at [facility], [address] [place], [place] [address]    Culture PENDING  Incomplete   Report Status PENDING  Incomplete     Time coordinating discharge: Over 30 minutes  SIGNED:   Kathie Dike, MD  [facility] 04/06/2017, 6:47 PM Pager   If 7PM-7AM, please contact night-coverage [email or web address] Password [email or web address]

## 2017-04-07 ENCOUNTER — Other Ambulatory Visit: Payer: Self-pay | Admitting: *Deleted

## 2017-04-07 LAB — STREP PNEUMONIAE URINARY ANTIGEN: Strep Pneumo Urinary Antigen: NEGATIVE

## 2017-04-07 NOTE — Patient Outreach (Signed)
[facility]) Care Management  04/07/2017  SUDEEP SCHEIBEL 1920/08/01 [id number]  Outreach to [name] and primary caregiver for Mr. Rodric, [employer] [facility] [phone]). Ms Charissa Bash says Mr. Klingbeil is settled in at home after his brief hospital admission for treatment of community acquired pneumonia. All medications were reviewed and there are no discrepancies. Mr. Ausborn does not have home health ordered as he has 24/7 private duty care already in place. He has home O2 which is being used continuously at 2l/[place] and a nebulizer with treatments ordered TID.  Mr. Memoli is scheduled to see the primary care provider for a post hospital follow up appointment on Thursday 04/10/17 and I called to reschedule his cardiology appointment with Jory Sims NP @ [facility] [place] for Friday at 3:30pm.   Plan: I will either call or see Mr. Schwertner weekly for the next 30 days as part of our transition of care series.   [name] or [name] will call for any new or worsened symptoms.    [facility]  [phone]

## 2017-04-08 ENCOUNTER — Other Ambulatory Visit: Payer: Self-pay | Admitting: *Deleted

## 2017-04-08 LAB — CULTURE, RESPIRATORY W GRAM STAIN

## 2017-04-08 LAB — CULTURE, RESPIRATORY: CULTURE: NORMAL

## 2017-04-08 NOTE — Patient Outreach (Signed)
[facility]) Care Management   04/08/2017  Dustin Blankenship 05/02/1920 [id number]  Dustin Blankenship is an 83 y.o. male withhistory of COPD, osteoarthritis, UTIs, afib with implanted [employer] dual chamber RF device implanted in October 2011, coronary artery disease, and dysphagia.   Dustin Blankenship lives at home in [place] with his wife Dustin Blankenship and has 24/7 private duty paid caregivers. His primary caregiver who is very knowledgeableabout Dustin Blankenship needs and has permission by Dustin Blankenship and his wife to share and receive information related to his needs is Dustin Blankenship.   Dustin Blankenship has had cognitive and behavioral changes over the last few months including confusion, paranoia, and verbal aggression. During my visit today, Dustin Blankenship was cooperative and engaged. He has excellent long term memory but struggles with short term memory. His conversation is often repetitive.   Most recently, he was noted to have increasing abdominal swelling and peripheral edema. He was seen in the office by Dustin Blankenship who opted to continue his once daily Lasix 51m dose and had labs performed for BMP which revealed no abnormalities.   Last week on Thursday, I saw Dustin Blankenship home and notified his provider team that he was having more shortness of breath, tachycardia, lower than baseline blood pressure, peripheral edema, abdominal swelling, and worsening confusion. An appointment was made to be seen in the cardiology office the next day [facility].   Mr. MDecairewas discharged to home late on Sunday afternoon. I am seeing him at home today at the request of his wife and primary caregiver Dustin Sauerbecause they are concerned about his continued weakness and confusion.   Subjective: [facility]  Objective:   Review of Systems  Constitutional: Positive for malaise/fatigue. Negative for chills and  fever.  HENT: Positive for hearing loss.        HOH  Respiratory: Positive for cough. Negative for sputum production and wheezing.        Wife/caregiver report non productive cough throughout last 24h  Cardiovascular: Negative for chest pain and leg swelling.  Gastrointestinal: Positive for constipation.       Caregiver and wife concerned about ongoing constipation  Genitourinary:       Incontinent of urine; wears adult undergarment; sometimes can tell he needs to void and gets to bathroom with help  Musculoskeletal: Negative for falls.  Skin: Negative.   Neurological: Positive for weakness. Negative for focal weakness.  Psychiatric/Behavioral: Positive for memory loss.    Physical Exam  Constitutional: He appears listless. He is sleeping. He has a sickly appearance. He appears ill. No distress.  Cardiovascular: Normal rate.  An irregularly irregular rhythm present.  Respiratory: No accessory muscle usage. No respiratory distress. He has decreased breath sounds in the right upper field, the right middle field, the right lower field, the left upper field, the left middle field and the left lower field. He has no wheezes. He has no rhonchi. He has no rales.  GI: Soft. He exhibits no distension. There is no tenderness.  Neurological: He appears listless.  Skin: Skin is warm, dry and intact. He is not diaphoretic. No cyanosis. There is pallor.  Psychiatric: He is slowed. Cognition and memory are impaired. He exhibits abnormal recent memory and abnormal remote memory.  Affect is flat and slowed; not oriented to place or time; knows his name; does not recognize me,  his wife, or caregivers    Encounter Medications:   Outpatient Encounter Prescriptions as of 04/08/2017  Medication Sig Note  . acetaminophen (TYLENOL) 500 MG tablet Take 1,000 mg by mouth daily.   Marland Kitchen albuterol (PROVENTIL) (2.5 MG/3ML) 0.083% nebulizer solution Take 3 mLs (2.5 mg total) by nebulization 3 (three) times daily.   Marland Kitchen  alfuzosin (UROXATRAL) 10 MG 24 hr tablet Take 1 tablet (10 mg total) by mouth daily with breakfast.   . allopurinol (ZYLOPRIM) 300 MG tablet Take 300 mg by mouth daily.   Marland Kitchen aspirin 325 MG EC tablet Take 325 mg by mouth daily.   . bisacodyl (DULCOLAX) 5 MG EC tablet Take 2 tablets (10 mg total) by mouth daily as needed for moderate constipation.   . Carboxymethylcellulose Sodium 0.25 % SOLN Place 1 drop into the left eye 4 (four) times daily as needed.  08/29/2016: Thera Tears  . COMBIGAN 0.2-0.5 % ophthalmic solution Place 1 drop into the left eye every 12 (twelve) hours. Left Eye   . docusate sodium (COLACE) 100 MG capsule Take 1 capsule (100 mg total) by mouth 2 (two) times daily as needed for mild constipation.   Marland Kitchen donepezil (ARICEPT) 10 MG tablet Take 10 mg by mouth at bedtime.    Marland Kitchen escitalopram (LEXAPRO) 10 MG tablet Take 10 mg by mouth at bedtime.    . furosemide (LASIX) 20 MG tablet Take 1 tablet (20 mg total) by mouth daily.   Marland Kitchen guaiFENesin (MUCINEX) 600 MG 12 hr tablet Take 2 tablets (1,200 mg total) by mouth 2 (two) times daily.   Marland Kitchen ipratropium (ATROVENT) 0.06 % nasal spray Place 2 sprays into both nostrils 4 (four) times daily.    Marland Kitchen levofloxacin (LEVAQUIN) 750 MG tablet Take 1 tablet (750 mg total) by mouth daily.   Marland Kitchen levothyroxine (SYNTHROID, LEVOTHROID) 125 MCG tablet Take 125 mcg by mouth daily.   Marland Kitchen LORazepam (ATIVAN) 1 MG tablet Take 1 tablet (1 mg total) by mouth 2 (two) times daily.   . nitroGLYCERIN (NITROSTAT) 0.4 MG SL tablet Place 0.4 mg under the tongue every 5 (five) minutes as needed. Chest Pain 12/15/2015: On hand; new bottle obtained December  . OXYGEN Inhale 2 L into the lungs continuous. [employer]   . pilocarpine (PILOCAR) 1 % ophthalmic solution Place 1 drop into both eyes 3 (three) times daily.    [name] Glycol-Propyl Glycol (SYSTANE) 0.4-0.3 % GEL Place 1-2 drops into the right eye 4 (four) times daily. Per wife sometimes he may use 3 drops in the right eye.    .  polyethylene glycol (MIRALAX / GLYCOLAX) packet Take 17 g by mouth daily as needed for mild constipation.    . predniSONE (DELTASONE) 10 MG tablet Take 23m po daily for 2 days then 329mdaily for 2 days then 2024maily for 2 days then 62m70mily for 2 days then stop   . risperiDONE (RISPERDAL) 0.5 MG tablet TAKE (1) TABLET BY MOUTH TWICE DAILY.   . saw palmetto 500 MG capsule Take 500 mg by mouth 2 (two) times daily.     Assessment:  89 y9r old male with dementia discharged from the hospital 2 days ago after admission for pneumonia.   Mr. MyerNanneylying in bed on his right side. He arouses easily but has difficulty staying awake. He does not turn from his side when requested. He knows his name but is not oriented to time/place. He does not know his wife's name or who she is  although she tells me he recognized her one time last evening.   Dustin Blankenship breath sounds are diminished throughout in part because of his near fetal position in the bed and unwillingness or ability to turn himself or be assisted to turn. Nasal congestion is noted. His wife and caregiver say he has had a non productive cough since getting home from the hospital but he does not have a cough while I am visiting. His O2 saturation is 89-90% while lying on his side with O2 @ 2l/[place] continuously.   Mrs. Makar and the caregivers report that Dustin Blankenship has sat up on the side of the bed x 2 since discharge to home and once he sat on the bedside commode for 2 minutes. He is eating poorly and unable to perform any self care activities. He does take medications when given to him. He has been taking oral Levofloxacin x 2 days.   His wife is concerned about constipation but doesn't want to give him Linzess because it made him have diarrhea when he last took it. She plans to give him 2 Colace instead of 1 tomorrow morning.   Dustin Blankenship is scheduled for an appointment with Dustin Blankenship on Thursday and with Jory Sims NP @ [facility] on Friday. Mrs.  Cropper and the caregivers and daughter say they are very concerned about being able to get him up and dressed and in the car to the doctor's office.   Mrs. Doyle Blankenship and Dustin Blankenship' daughter [name] is present and asked about the appropriateness of Hospice care for Dustin Blankenship. Mrs. Muns quickly agreed with the line of questioning and said she would like to talk with someone from Hospice about services offered. She says her main concern is that Dustin Blankenship' symptoms be managed. She says that Dustin Blankenship has told her numerous times over the last few months that he doesn't want to go to the hospital any more and she wants to honor his wishes.   Plan: I reached out to Dustin Blankenship via his nurse [name] and left a message requesting review of my assessment note and input/direction from Dustin Blankenship regarding his recommendations about Hospice care.   I will call Mr. & Mrs. Reihl again tomorrow to check on his status and follow up about Dr. Samella Parr recommendations.    [facility]  [phone]      Plan:

## 2017-04-09 ENCOUNTER — Other Ambulatory Visit: Payer: Self-pay | Admitting: *Deleted

## 2017-04-09 ENCOUNTER — Ambulatory Visit: Payer: Self-pay | Admitting: *Deleted

## 2017-04-09 ENCOUNTER — Other Ambulatory Visit: Payer: Self-pay | Admitting: Family Medicine

## 2017-04-09 DIAGNOSIS — I482 Chronic atrial fibrillation, unspecified: Secondary | ICD-10-CM

## 2017-04-09 DIAGNOSIS — R627 Adult failure to thrive: Secondary | ICD-10-CM

## 2017-04-09 DIAGNOSIS — J431 Panlobular emphysema: Secondary | ICD-10-CM

## 2017-04-09 DIAGNOSIS — I2510 Atherosclerotic heart disease of native coronary artery without angina pectoris: Secondary | ICD-10-CM

## 2017-04-09 DIAGNOSIS — I2583 Coronary atherosclerosis due to lipid rich plaque: Secondary | ICD-10-CM

## 2017-04-09 DIAGNOSIS — I5032 Chronic diastolic (congestive) heart failure: Secondary | ICD-10-CM

## 2017-04-09 DIAGNOSIS — F0391 Unspecified dementia with behavioral disturbance: Secondary | ICD-10-CM

## 2017-04-09 NOTE — Patient Outreach (Signed)
[facility]) Care Management  04/09/2017  Dustin Blankenship 09-14-1920 [id number]   Hospice Referral - return message received from Dr. Dennard Schaumann regarding Hospice care/referral for [name]. Dr. Dennard Schaumann has ordered Hospice referral. I notified [name] and asked that she expect a call from the Hospice team. I provided [name], Dr. Samella Parr nurse, with contact information for [name] and [name] (both CNA's/care providers) for [name] should she have difficulty reaching [name].   Recurrent/Intermittent Hallucinations - I received a call from [name] daughter [name] today. She reported that [name] was having "a rough day" in that he was having hallucinations today. She said he thought he saw alligators in the living room. [name] also reported that [name] was "uncharacteristically demanding and very rude" to his caregivers today. However, she did say that he seemed to be better in terms of energy and alertness, reporting that he was able to get out of the bed with assistance and allowed the caregivers to help him with a bath and dressing.   Plan: I will reach out to [name] wife and caregivers again tomorrow to follow up on his progress and to see if they have heard from the Hospice team.    [facility]  [phone]

## 2017-04-10 ENCOUNTER — Other Ambulatory Visit: Payer: Self-pay | Admitting: *Deleted

## 2017-04-10 ENCOUNTER — Encounter: Payer: Self-pay | Admitting: Physician Assistant

## 2017-04-10 ENCOUNTER — Ambulatory Visit (INDEPENDENT_AMBULATORY_CARE_PROVIDER_SITE_OTHER): Payer: Medicare Other | Admitting: Physician Assistant

## 2017-04-10 VITALS — BP 110/60 | HR 107 | Temp 98.0°F | Resp 16 | Wt 206.0 lb

## 2017-04-10 DIAGNOSIS — I2510 Atherosclerotic heart disease of native coronary artery without angina pectoris: Secondary | ICD-10-CM

## 2017-04-10 DIAGNOSIS — J189 Pneumonia, unspecified organism: Secondary | ICD-10-CM | POA: Diagnosis not present

## 2017-04-10 DIAGNOSIS — I5032 Chronic diastolic (congestive) heart failure: Secondary | ICD-10-CM | POA: Diagnosis not present

## 2017-04-10 DIAGNOSIS — Z09 Encounter for follow-up examination after completed treatment for conditions other than malignant neoplasm: Secondary | ICD-10-CM

## 2017-04-10 DIAGNOSIS — R627 Adult failure to thrive: Secondary | ICD-10-CM | POA: Diagnosis not present

## 2017-04-10 DIAGNOSIS — J431 Panlobular emphysema: Secondary | ICD-10-CM | POA: Diagnosis not present

## 2017-04-10 DIAGNOSIS — F0391 Unspecified dementia with behavioral disturbance: Secondary | ICD-10-CM | POA: Diagnosis not present

## 2017-04-10 DIAGNOSIS — I482 Chronic atrial fibrillation: Secondary | ICD-10-CM | POA: Diagnosis not present

## 2017-04-10 DIAGNOSIS — I2583 Coronary atherosclerosis due to lipid rich plaque: Secondary | ICD-10-CM | POA: Diagnosis not present

## 2017-04-10 DIAGNOSIS — J449 Chronic obstructive pulmonary disease, unspecified: Secondary | ICD-10-CM | POA: Diagnosis not present

## 2017-04-10 LAB — CBC WITH DIFFERENTIAL/PLATELET
BASOS PCT: 0 %
Basophils Absolute: 0 cells/uL (ref 0–200)
EOS PCT: 0 %
Eosinophils Absolute: 0 cells/uL — ABNORMAL LOW (ref 15–500)
HEMATOCRIT: 35.8 % — AB (ref 38.5–50.0)
HEMOGLOBIN: 11.3 g/dL — AB (ref 13.0–17.0)
LYMPHS ABS: 608 {cells}/uL — AB (ref 850–3900)
Lymphocytes Relative: 8 %
MCH: 29.3 pg (ref 27.0–33.0)
MCHC: 31.6 g/dL — ABNORMAL LOW (ref 32.0–36.0)
MCV: 92.7 fL (ref 80.0–100.0)
MONO ABS: 608 {cells}/uL (ref 200–950)
MPV: 9.6 fL (ref 7.5–12.5)
Monocytes Relative: 8 %
NEUTROS ABS: 6384 {cells}/uL (ref 1500–7800)
Neutrophils Relative %: 84 %
Platelets: 308 10*3/uL (ref 140–400)
RBC: 3.86 MIL/uL — AB (ref 4.20–5.80)
RDW: 14.6 % (ref 11.0–15.0)
WBC: 7.6 10*3/uL (ref 3.8–10.8)

## 2017-04-10 LAB — BASIC METABOLIC PANEL WITH GFR
BUN: 32 mg/dL — ABNORMAL HIGH (ref 7–25)
CO2: 25 mmol/L (ref 20–31)
Calcium: 9.2 mg/dL (ref 8.6–10.3)
Chloride: 105 mmol/L (ref 98–110)
Creat: 0.98 mg/dL (ref 0.70–1.11)
GFR, EST NON AFRICAN AMERICAN: 65 mL/min (ref >=60)
GFR, Est African American: 75 mL/min (ref >=60)
GLUCOSE: 109 mg/dL — AB (ref 70–99)
POTASSIUM: 4.3 mmol/L (ref 3.5–5.3)
SODIUM: 144 mmol/L (ref 135–146)

## 2017-04-10 NOTE — Patient Outreach (Signed)
[facility]) Care Management  04/10/2017  Dustin Blankenship 24-Sep-1920 [id number]  Mr. Villada is seeing Dr. Dennard Schaumann in the office today. I left a message with [name], Mr. Baba primary caregiver, requesting that she return a call to me if she has any needs for Mr Husain with which I can assist.   Plan: I will review the office note from today's visit and will follow up with Mr. Dollins family/caregivers about his office visit and the Hospice referral made yesterday.    [facility]  [phone]

## 2017-04-10 NOTE — Progress Notes (Addendum)
Patient ID: Dustin Blankenship MRN: [id number], DOB: [birth year], 83 y.o. Date of Encounter: 04/10/2017, 11:13 AM    Chief Complaint:  Chief Complaint  Patient presents with  . pneumonia f/u    hospital f/u     HPI: 83 y.o. year old male is here for f/u after recent hospitalization.   Today I have reviewed the hospital discharge summary. The following information has been reviewed by me today and summarizes his hospitalization:  "Admit date: 04/04/2017 Discharge date: 04/06/2017  Admitted From: Home Disposition: Home  Recommendations for Outpatient Follow-up:  1. Follow up with PCP in 1-2 weeks 2. Please obtain BMP/CBC in one week  Home Health:Home health physical therapy Equipment/Devices:Nebulizer, oxygen at 2 L  Discharge Condition:Stable CODE STATUS:DO NOT RESUSCITATE Diet recommendation: Heart Healthy   Brief/Interim Summary: 83 year old male with a history of COPD and chronic respiratory failure presents to the hospital with coughing, shortness of breath and increased confusion. Found to have community-acquired pneumonia and started on intravenous antibiotics.  Discharge Diagnoses:  Principal Problem:   CAP (community acquired pneumonia) Active Problems:   COPD (chronic obstructive pulmonary disease) ([place])   Atrial fibrillation (HCC)   CAD (coronary artery disease) - suspected based on abnormal nuclear stress test   Dementia with behavioral disturbance   Chronic diastolic CHF (congestive heart failure) ([place])  1. Community-acquired pneumonia. Patient was treated with Levaquin. Fever resolved. Leukocytosis resolved. He's been transitioned to oral Levaquin to complete his course.  2. COPD with chronic respiratory failure. Treated with bronchodilators. Placed on prednisone taper on discharge. Wheezing has resolved the time of discharge.  3. Chronic atrial fibrillation. Was previously on warfarin, but this was discontinued due to fall risk. Continue on aspirin.  Rate is controlled.  4. Chronic diastolic CHF. Appears to be euvolemic at this time. Resume home dose of Lasix on discharge.  5. Dementia with behavioral disturbances. Patient appears to be sundowning and mental status is worse at night. He is on Risperdal. We'll continue Ativan as needed. Continue Aricept.  6. Constipation. Abdominal x-ray shows rectal stool. Abdomen is distended. He was started on a bowel regimen with good bowel movements. He'll be continued on the same at home.    TODAY: A hired caregiver accompanies him today. She lets me know that they have been having problems with him being increasingly agitated and he wants her to be his caregiver nonstop and is constantly asking for her. Says that they have an appointment at [facility] later today. No other concerns today. Breathing seems to be back to baseline. He has had no fevers.  Home Meds:   Outpatient Medications Prior to Visit  Medication Sig Dispense Refill  . acetaminophen (TYLENOL) 500 MG tablet Take 1,000 mg by mouth daily.    Marland Kitchen albuterol (PROVENTIL) (2.5 MG/3ML) 0.083% nebulizer solution Take 3 mLs (2.5 mg total) by nebulization 3 (three) times daily. 75 mL 12  . alfuzosin (UROXATRAL) 10 MG 24 hr tablet Take 1 tablet (10 mg total) by mouth daily with breakfast. 30 tablet 3  . allopurinol (ZYLOPRIM) 300 MG tablet Take 300 mg by mouth daily.    Marland Kitchen aspirin 325 MG EC tablet Take 325 mg by mouth daily.    . bisacodyl (DULCOLAX) 5 MG EC tablet Take 2 tablets (10 mg total) by mouth daily as needed for moderate constipation. 30 tablet 0  . Carboxymethylcellulose Sodium 0.25 % SOLN Place 1 drop into the left eye 4 (four) times daily as needed.     Marland Kitchen  COMBIGAN 0.2-0.5 % ophthalmic solution Place 1 drop into the left eye every 12 (twelve) hours. Left Eye    . docusate sodium (COLACE) 100 MG capsule Take 1 capsule (100 mg total) by mouth 2 (two) times daily as needed for mild constipation. 30 capsule 0  . donepezil (ARICEPT) 10 MG  tablet Take 10 mg by mouth at bedtime.     Marland Kitchen escitalopram (LEXAPRO) 10 MG tablet Take 10 mg by mouth at bedtime.     . furosemide (LASIX) 20 MG tablet Take 1 tablet (20 mg total) by mouth daily. 30 tablet 3  . guaiFENesin (MUCINEX) 600 MG 12 hr tablet Take 2 tablets (1,200 mg total) by mouth 2 (two) times daily. 30 tablet 0  . ipratropium (ATROVENT) 0.06 % nasal spray Place 2 sprays into both nostrils 4 (four) times daily.     Marland Kitchen levofloxacin (LEVAQUIN) 750 MG tablet Take 1 tablet (750 mg total) by mouth daily. 4 tablet 0  . levothyroxine (SYNTHROID, LEVOTHROID) 125 MCG tablet Take 125 mcg by mouth daily.    Marland Kitchen LORazepam (ATIVAN) 1 MG tablet Take 1 tablet (1 mg total) by mouth 2 (two) times daily. 60 tablet 2  . nitroGLYCERIN (NITROSTAT) 0.4 MG SL tablet Place 0.4 mg under the tongue every 5 (five) minutes as needed. Chest Pain    . OXYGEN Inhale 2 L into the lungs continuous. [employer]    . pilocarpine (PILOCAR) 1 % ophthalmic solution Place 1 drop into both eyes 3 (three) times daily.     [name] Glycol-Propyl Glycol (SYSTANE) 0.4-0.3 % GEL Place 1-2 drops into the right eye 4 (four) times daily. Per wife sometimes he may use 3 drops in the right eye.     . polyethylene glycol (MIRALAX / GLYCOLAX) packet Take 17 g by mouth daily as needed for mild constipation.     . risperiDONE (RISPERDAL) 0.5 MG tablet TAKE (1) TABLET BY MOUTH TWICE DAILY. 60 tablet 3  . saw palmetto 500 MG capsule Take 500 mg by mouth 2 (two) times daily.     . predniSONE (DELTASONE) 10 MG tablet Take 63m po daily for 2 days then 347mdaily for 2 days then 2065maily for 2 days then 50m38mily for 2 days then stop 20 tablet 0   No facility-administered medications prior to visit.     Allergies:  Allergies  Allergen Reactions  . Morphine And Related   . Multaq [Dronedarone]   . Penicillins Other (See Comments)    Unknown. Patient received zosyn this am at 0415(10/24/15) without any problems Has patient had a PCN  reaction causing immediate rash, facial/tongue/throat swelling, SOB or lightheadedness with hypotension: Unknown Has patient had a PCN reaction causing severe rash involving mucus membranes or skin necrosis: Unknown Has patient had a PCN reaction that required hospitalization Unknown Has patient had a PCN reaction occurring within the last 10 years: No If all of the above answers are "NO", then may p  . Latex Rash  . Sulfonamide Derivatives Rash  . Testosterone Rash    The cream form causes a rash when rubbed on skin.       Review of Systems: See HPI for pertinent ROS. All other ROS negative.    Physical Exam: Blood pressure 110/60, pulse (!) 107, temperature 98 F (36.7 C), temperature source Oral, resp. rate 16, weight 206 lb (93.4 kg), SpO2 92 %., Body mass index is 29.56 kg/m. General:  Elderly, Frail WM. Appears in no acute distress. Neck:  Supple. No thyromegaly. No lymphadenopathy. Lungs: Distant, decreased breath sounds but clear. No rhonchi, rales, wheezing. Heart: Regular rhythm. No murmurs, rubs, or gallops. Msk:  Strength and tone normal for age. Extremities/Skin: Warm and dry.  Neuro: Alert and oriented X 3. Moves all extremities spontaneously. Gait is normal. CNII-XII grossly in tact. Psych:  Responds to questions appropriately with a normal affect.     ASSESSMENT AND PLAN:  83 y.o. year old male with  1. Hospital discharge follow-up Will obtain f/u labs as outlined on Discharge Summary - CBC with Differential/Platelet - BASIC METABOLIC PANEL WITH GFR  2. Community acquired pneumonia, unspecified laterality --He completed abx. Resolved.   Signed, [address] [place], [place], [facility] 04/10/2017 11:13 AM

## 2017-04-11 ENCOUNTER — Encounter: Payer: Self-pay | Admitting: Adult Health

## 2017-04-11 DIAGNOSIS — I2510 Atherosclerotic heart disease of native coronary artery without angina pectoris: Secondary | ICD-10-CM | POA: Diagnosis not present

## 2017-04-11 DIAGNOSIS — J431 Panlobular emphysema: Secondary | ICD-10-CM | POA: Diagnosis not present

## 2017-04-11 DIAGNOSIS — I482 Chronic atrial fibrillation: Secondary | ICD-10-CM | POA: Diagnosis not present

## 2017-04-11 DIAGNOSIS — J449 Chronic obstructive pulmonary disease, unspecified: Secondary | ICD-10-CM | POA: Diagnosis not present

## 2017-04-11 DIAGNOSIS — I5032 Chronic diastolic (congestive) heart failure: Secondary | ICD-10-CM | POA: Diagnosis not present

## 2017-04-11 DIAGNOSIS — F0391 Unspecified dementia with behavioral disturbance: Secondary | ICD-10-CM | POA: Diagnosis not present

## 2017-04-11 NOTE — Progress Notes (Deleted)
Cardiology Office Note   Date:  04/11/2017   ID:  Dustin Blankenship, DOB 1920-06-12, MRN [id number]  PCP:  Odette Fraction, MD  Cardiologist: Denna Haggard, NP   No chief complaint on file.     History of Present Illness: Dustin Blankenship is a 83 y.o. male who presents for posthospitalization follow-up, with known history of cor pulmonale, atrial fibrillation, CAD, and pacemaker in situ. The patient was last seen by cardiology in 2015. Patient was admitted to [facility] in the setting of community-acquired pneumonia, COPD, and was treated with IV antibiotics and breathing treatments. He was noted that he had not had follow-up with cardiology and was advised to do so posthospitalization. He was not placed on anticoagulation therapy due to frailty and fall risk.    Past Medical History:  Diagnosis Date  . Atrial fib/flutter, transient    not on warfarin due to risk of falls ,on aspirin for prophylaxis  . CHF (congestive heart failure) (HCC)    right heart failure and pulm htn due to chronic hypoxia from COPD  . COPD (chronic obstructive pulmonary disease) ([place])   . GERD (gastroesophageal reflux disease)   . Glaucoma   . Glaucoma   . Gout   . Hypothyroid   . Pacemaker 09/20/2010    ST Jude Accent DR RF device  MODEL #[id number],SERIAL #[id number]  . Pulmonary HTN (HCC)    DUE TO CHRONIC HYPOXIA   FROM COPD  . Sinus node dysfunction ([place])    Dual-chamber permanent pacermaker ST JUDE    Past Surgical History:  Procedure Laterality Date  . COLONOSCOPY W/ POLYPECTOMY    . DOPPLER ECHOCARDIOGRAPHY  07/25/2011   EF =50-55%  . ESOPHAGEAL DILATION N/A 02/24/2015   Procedure: ESOPHAGEAL DILATION;  Surgeon: [name], MD;  Location[facility];  Service: Endoscopy;  Laterality: N/A;  . ESOPHAGOGASTRODUODENOSCOPY N/A 02/24/2015   Procedure: ESOPHAGOGASTRODUODENOSCOPY (EGD);  Surgeon: [name], MD;  Location: [facility];  Service: Endoscopy;  Laterality: N/A;   10:00  . INSERT / REPLACE / REMOVE PACEMAKER  09/20/2010   ST JUDE ACCENT-dual chamber ;    . LUNG BIOPSY    . NM MYOCAR PERF WALL MOTION  07/25/2011     Current Outpatient Prescriptions  Medication Sig Dispense Refill  . acetaminophen (TYLENOL) 500 MG tablet Take 1,000 mg by mouth daily.    Marland Kitchen albuterol (PROVENTIL) (2.5 MG/3ML) 0.083% nebulizer solution Take 3 mLs (2.5 mg total) by nebulization 3 (three) times daily. 75 mL 12  . alfuzosin (UROXATRAL) 10 MG 24 hr tablet Take 1 tablet (10 mg total) by mouth daily with breakfast. 30 tablet 3  . allopurinol (ZYLOPRIM) 300 MG tablet Take 300 mg by mouth daily.    Marland Kitchen aspirin 325 MG EC tablet Take 325 mg by mouth daily.    . bisacodyl (DULCOLAX) 5 MG EC tablet Take 2 tablets (10 mg total) by mouth daily as needed for moderate constipation. 30 tablet 0  . Carboxymethylcellulose Sodium 0.25 % SOLN Place 1 drop into the left eye 4 (four) times daily as needed.     . COMBIGAN 0.2-0.5 % ophthalmic solution Place 1 drop into the left eye every 12 (twelve) hours. Left Eye    . docusate sodium (COLACE) 100 MG capsule Take 1 capsule (100 mg total) by mouth 2 (two) times daily as needed for mild constipation. 30 capsule 0  . donepezil (ARICEPT) 10 MG tablet Take 10 mg by mouth at  bedtime.     Marland Kitchen escitalopram (LEXAPRO) 10 MG tablet Take 10 mg by mouth at bedtime.     . furosemide (LASIX) 20 MG tablet Take 1 tablet (20 mg total) by mouth daily. 30 tablet 3  . guaiFENesin (MUCINEX) 600 MG 12 hr tablet Take 2 tablets (1,200 mg total) by mouth 2 (two) times daily. 30 tablet 0  . ipratropium (ATROVENT) 0.06 % nasal spray Place 2 sprays into both nostrils 4 (four) times daily.     Marland Kitchen levofloxacin (LEVAQUIN) 750 MG tablet Take 1 tablet (750 mg total) by mouth daily. 4 tablet 0  . levothyroxine (SYNTHROID, LEVOTHROID) 125 MCG tablet Take 125 mcg by mouth daily.    Marland Kitchen LORazepam (ATIVAN) 1 MG tablet Take 1 tablet (1 mg total) by mouth 2 (two) times daily. 60 tablet 2  .  nitroGLYCERIN (NITROSTAT) 0.4 MG SL tablet Place 0.4 mg under the tongue every 5 (five) minutes as needed. Chest Pain    . OXYGEN Inhale 2 L into the lungs continuous. [employer]    . pilocarpine (PILOCAR) 1 % ophthalmic solution Place 1 drop into both eyes 3 (three) times daily.     [name] Glycol-Propyl Glycol (SYSTANE) 0.4-0.3 % GEL Place 1-2 drops into the right eye 4 (four) times daily. Per wife sometimes he may use 3 drops in the right eye.     . polyethylene glycol (MIRALAX / GLYCOLAX) packet Take 17 g by mouth daily as needed for mild constipation.     . risperiDONE (RISPERDAL) 0.5 MG tablet TAKE (1) TABLET BY MOUTH TWICE DAILY. 60 tablet 3  . saw palmetto 500 MG capsule Take 500 mg by mouth 2 (two) times daily.      No current facility-administered medications for this visit.     Allergies:   Morphine and related; Multaq [dronedarone]; Penicillins; Latex; Sulfonamide derivatives; and Testosterone    Social History:  The patient  reports that he quit smoking about 63 years ago. His smoking use included Cigarettes. He started smoking about 65 years ago. He has a 54.00 pack-year smoking history. He has never used smokeless tobacco. He reports that he does not drink alcohol or use drugs.   Family History:  The patient's family history is not on file.    ROS: All other systems are reviewed and negative. Unless otherwise mentioned in H&P    PHYSICAL EXAM: VS:  There were no vitals taken for this visit. , BMI There is no height or weight on file to calculate BMI. GEN: Well nourished, well developed, in no acute distress  HEENT: normal  Neck: no JVD, carotid bruits, or masses Cardiac: ***RRR; no murmurs, rubs, or gallops,no edema  Respiratory:  clear to auscultation bilaterally, normal work of breathing GI: soft, nontender, nondistended, + BS MS: no deformity or atrophy  Skin: warm and dry, no rash Neuro:  Strength and sensation are intact Psych: euthymic mood, full  affect   EKG:  EKG {ACTION; IS/IS [id number]} ordered today. The ekg ordered today demonstrates ***   Recent Labs: 11/26/2016: ALT 11; TSH 1.09 04/10/2017: BUN 32; Creat 0.98; Hemoglobin 11.3; Platelets 308; Potassium 4.3; Sodium 144    Lipid Panel    Component Value Date/Time   CHOL  06/04/2010 0522    107        ATP III CLASSIFICATION:  <200     mg/dL   Desirable  200-239  mg/dL   Borderline High  >=240    mg/dL   High  TRIG 107 06/04/2010 0522   HDL 31 (L) 06/04/2010 0522   CHOLHDL 3.5 06/04/2010 0522   VLDL 21 06/04/2010 0522   LDLCALC  06/04/2010 0522    55        Total Cholesterol/HDL:CHD Risk Coronary Heart Disease Risk Table                     Men   Women  1/2 Average Risk   3.4   3.3  Average Risk       5.0   4.4  2 X Average Risk   9.6   7.1  3 X Average Risk  23.4   11.0        Use the calculated Patient Ratio above and the CHD Risk Table to determine the patient's CHD Risk.        ATP III CLASSIFICATION (LDL):  <100     mg/dL   Optimal  100-129  mg/dL   Near or Above                    Optimal  130-159  mg/dL   Borderline  160-189  mg/dL   High  >190     mg/dL   Very High      Wt Readings from Last 3 Encounters:  04/10/17 206 lb (93.4 kg)  04/04/17 206 lb (93.4 kg)  04/01/17 206 lb (93.4 kg)      Other studies Reviewed: Additional studies/ records that were reviewed today include: ***. Review of the above records demonstrates: ***   ASSESSMENT AND PLAN:  1.  ***   Current medicines are reviewed at length with the patient today.    Labs/ tests ordered today include: *** No orders of the defined types were placed in this encounter.    Disposition:   FU with   Signed, Jory Sims, NP  04/11/2017 7:36 AM    [place]. [address] [place], [place] [address] Phone: [phone]; Fax: [phone]

## 2017-04-14 ENCOUNTER — Other Ambulatory Visit: Payer: Self-pay | Admitting: *Deleted

## 2017-04-14 NOTE — Patient Outreach (Signed)
[facility]) Care Management  04/14/2017  Dustin Blankenship 02/04/1920 [id number]  Call received from [name] on behalf of Dustin Blankenship who says she would like to check on Dustin Blankenship most recent labs, stating she hasn't heard from anyone. I advised that lab results were available from Dustin Blankenship office visit last week on Thursday and provided the contact information for her to call the office to receive the results.   Note - Dustin Blankenship is now receiving Hospice Care through [facility] [place].   Plan: I will continue to follow Dustin Blankenship in the community to provide coordination of care services while all Hospice services and plans are being put into place and finalized.    [place] Management  [phone]

## 2017-04-15 ENCOUNTER — Other Ambulatory Visit: Payer: Self-pay | Admitting: Family Medicine

## 2017-04-15 DIAGNOSIS — R627 Adult failure to thrive: Secondary | ICD-10-CM | POA: Diagnosis not present

## 2017-04-15 DIAGNOSIS — J431 Panlobular emphysema: Secondary | ICD-10-CM | POA: Diagnosis not present

## 2017-04-15 DIAGNOSIS — I2510 Atherosclerotic heart disease of native coronary artery without angina pectoris: Secondary | ICD-10-CM | POA: Diagnosis not present

## 2017-04-15 DIAGNOSIS — I5032 Chronic diastolic (congestive) heart failure: Secondary | ICD-10-CM | POA: Diagnosis not present

## 2017-04-15 DIAGNOSIS — F0391 Unspecified dementia with behavioral disturbance: Secondary | ICD-10-CM | POA: Diagnosis not present

## 2017-04-15 DIAGNOSIS — I482 Chronic atrial fibrillation: Secondary | ICD-10-CM | POA: Diagnosis not present

## 2017-04-15 DIAGNOSIS — J449 Chronic obstructive pulmonary disease, unspecified: Secondary | ICD-10-CM | POA: Diagnosis not present

## 2017-04-16 DIAGNOSIS — J431 Panlobular emphysema: Secondary | ICD-10-CM | POA: Diagnosis not present

## 2017-04-16 DIAGNOSIS — I5032 Chronic diastolic (congestive) heart failure: Secondary | ICD-10-CM | POA: Diagnosis not present

## 2017-04-16 DIAGNOSIS — I482 Chronic atrial fibrillation: Secondary | ICD-10-CM | POA: Diagnosis not present

## 2017-04-16 DIAGNOSIS — J449 Chronic obstructive pulmonary disease, unspecified: Secondary | ICD-10-CM | POA: Diagnosis not present

## 2017-04-16 DIAGNOSIS — F0391 Unspecified dementia with behavioral disturbance: Secondary | ICD-10-CM | POA: Diagnosis not present

## 2017-04-16 DIAGNOSIS — I2510 Atherosclerotic heart disease of native coronary artery without angina pectoris: Secondary | ICD-10-CM | POA: Diagnosis not present

## 2017-04-17 ENCOUNTER — Other Ambulatory Visit: Payer: Self-pay | Admitting: *Deleted

## 2017-04-17 DIAGNOSIS — F0391 Unspecified dementia with behavioral disturbance: Secondary | ICD-10-CM | POA: Diagnosis not present

## 2017-04-17 DIAGNOSIS — J449 Chronic obstructive pulmonary disease, unspecified: Secondary | ICD-10-CM | POA: Diagnosis not present

## 2017-04-17 DIAGNOSIS — I2510 Atherosclerotic heart disease of native coronary artery without angina pectoris: Secondary | ICD-10-CM | POA: Diagnosis not present

## 2017-04-17 DIAGNOSIS — I482 Chronic atrial fibrillation: Secondary | ICD-10-CM | POA: Diagnosis not present

## 2017-04-17 DIAGNOSIS — I5032 Chronic diastolic (congestive) heart failure: Secondary | ICD-10-CM | POA: Diagnosis not present

## 2017-04-17 DIAGNOSIS — J431 Panlobular emphysema: Secondary | ICD-10-CM | POA: Diagnosis not present

## 2017-04-17 NOTE — Patient Outreach (Signed)
[facility]) Care Management  04/17/2017  POSEIDON PAM 03/31/1920 [id number]  Outreach to follow up on Dustin Blankenship progress and condition today. He was referred to Hospice last week at the request of his wife and daughter. Dustin Blankenship has experienced significant cognitive decline in the last 6 months as well as a recent bout of pneumonia for which he was briefly hospitalized. As such, his wife/daughter and 24/7 caregivers all agree that Dustin Blankenship needs the assistance of the specialty care and symptom management care that is afforded him by the experts on the Hospice team. I spoke at length with Dustin Blankenship by phone. She says that the Hospice team and services are in place and have been available to help or advise any time she has called. Dustin Blankenship continues to have 24/7 private duty care in addition to the Hospice services. When Dustin Blankenship was handed the phone today, he made a few unintelligible sounds but was unable to converse with me.   Plan: I will discharge Dustin Blankenship from care management services through [facility] as he now has case management services in place via [facility].    [place] Management  [phone]

## 2017-04-21 DIAGNOSIS — F0391 Unspecified dementia with behavioral disturbance: Secondary | ICD-10-CM | POA: Diagnosis not present

## 2017-04-21 DIAGNOSIS — J449 Chronic obstructive pulmonary disease, unspecified: Secondary | ICD-10-CM | POA: Diagnosis not present

## 2017-04-21 DIAGNOSIS — I2510 Atherosclerotic heart disease of native coronary artery without angina pectoris: Secondary | ICD-10-CM | POA: Diagnosis not present

## 2017-04-21 DIAGNOSIS — J431 Panlobular emphysema: Secondary | ICD-10-CM | POA: Diagnosis not present

## 2017-04-21 DIAGNOSIS — I5032 Chronic diastolic (congestive) heart failure: Secondary | ICD-10-CM | POA: Diagnosis not present

## 2017-04-21 DIAGNOSIS — I482 Chronic atrial fibrillation: Secondary | ICD-10-CM | POA: Diagnosis not present

## 2017-04-22 DIAGNOSIS — I482 Chronic atrial fibrillation: Secondary | ICD-10-CM | POA: Diagnosis not present

## 2017-04-22 DIAGNOSIS — F0391 Unspecified dementia with behavioral disturbance: Secondary | ICD-10-CM | POA: Diagnosis not present

## 2017-04-22 DIAGNOSIS — J431 Panlobular emphysema: Secondary | ICD-10-CM | POA: Diagnosis not present

## 2017-04-22 DIAGNOSIS — I5032 Chronic diastolic (congestive) heart failure: Secondary | ICD-10-CM | POA: Diagnosis not present

## 2017-04-22 DIAGNOSIS — I2510 Atherosclerotic heart disease of native coronary artery without angina pectoris: Secondary | ICD-10-CM | POA: Diagnosis not present

## 2017-04-22 DIAGNOSIS — J449 Chronic obstructive pulmonary disease, unspecified: Secondary | ICD-10-CM | POA: Diagnosis not present

## 2017-04-23 DIAGNOSIS — I5032 Chronic diastolic (congestive) heart failure: Secondary | ICD-10-CM | POA: Diagnosis not present

## 2017-04-23 DIAGNOSIS — J449 Chronic obstructive pulmonary disease, unspecified: Secondary | ICD-10-CM | POA: Diagnosis not present

## 2017-04-23 DIAGNOSIS — J431 Panlobular emphysema: Secondary | ICD-10-CM | POA: Diagnosis not present

## 2017-04-23 DIAGNOSIS — F0391 Unspecified dementia with behavioral disturbance: Secondary | ICD-10-CM | POA: Diagnosis not present

## 2017-04-23 DIAGNOSIS — I2510 Atherosclerotic heart disease of native coronary artery without angina pectoris: Secondary | ICD-10-CM | POA: Diagnosis not present

## 2017-04-23 DIAGNOSIS — I482 Chronic atrial fibrillation: Secondary | ICD-10-CM | POA: Diagnosis not present

## 2017-04-24 ENCOUNTER — Other Ambulatory Visit: Payer: Self-pay | Admitting: *Deleted

## 2017-04-24 DIAGNOSIS — I2510 Atherosclerotic heart disease of native coronary artery without angina pectoris: Secondary | ICD-10-CM | POA: Diagnosis not present

## 2017-04-24 DIAGNOSIS — I5032 Chronic diastolic (congestive) heart failure: Secondary | ICD-10-CM | POA: Diagnosis not present

## 2017-04-24 DIAGNOSIS — J431 Panlobular emphysema: Secondary | ICD-10-CM | POA: Diagnosis not present

## 2017-04-24 DIAGNOSIS — J449 Chronic obstructive pulmonary disease, unspecified: Secondary | ICD-10-CM | POA: Diagnosis not present

## 2017-04-24 DIAGNOSIS — I482 Chronic atrial fibrillation: Secondary | ICD-10-CM | POA: Diagnosis not present

## 2017-04-24 DIAGNOSIS — F0391 Unspecified dementia with behavioral disturbance: Secondary | ICD-10-CM | POA: Diagnosis not present

## 2017-04-24 NOTE — Patient Outreach (Signed)
[facility]) Care Management  04/24/2017  JHOSTIN EPPS May 30, 1920 [id number]  Courtesy visit to the home of Mr. Norden. His wife passed unexpectedly yesterday. I visited Mr. Cuevas and his daughter and son in law in the family home briefly today to express my condolences and offer the support of [place] Management. Mr. Sennett is now under the care of [facility]. He was not alert today when I visited.    [place] Management  [phone]

## 2017-04-25 ENCOUNTER — Encounter: Payer: Self-pay | Admitting: Family Medicine

## 2017-04-28 DIAGNOSIS — I5032 Chronic diastolic (congestive) heart failure: Secondary | ICD-10-CM | POA: Diagnosis not present

## 2017-04-28 DIAGNOSIS — J449 Chronic obstructive pulmonary disease, unspecified: Secondary | ICD-10-CM | POA: Diagnosis not present

## 2017-04-28 DIAGNOSIS — I2510 Atherosclerotic heart disease of native coronary artery without angina pectoris: Secondary | ICD-10-CM | POA: Diagnosis not present

## 2017-04-28 DIAGNOSIS — J431 Panlobular emphysema: Secondary | ICD-10-CM | POA: Diagnosis not present

## 2017-04-28 DIAGNOSIS — F0391 Unspecified dementia with behavioral disturbance: Secondary | ICD-10-CM | POA: Diagnosis not present

## 2017-04-28 DIAGNOSIS — I482 Chronic atrial fibrillation: Secondary | ICD-10-CM | POA: Diagnosis not present

## 2017-04-29 ENCOUNTER — Other Ambulatory Visit: Payer: Self-pay | Admitting: Family Medicine

## 2017-04-29 DIAGNOSIS — I482 Chronic atrial fibrillation: Secondary | ICD-10-CM | POA: Diagnosis not present

## 2017-04-29 DIAGNOSIS — J449 Chronic obstructive pulmonary disease, unspecified: Secondary | ICD-10-CM | POA: Diagnosis not present

## 2017-04-29 DIAGNOSIS — I5032 Chronic diastolic (congestive) heart failure: Secondary | ICD-10-CM | POA: Diagnosis not present

## 2017-04-29 DIAGNOSIS — J431 Panlobular emphysema: Secondary | ICD-10-CM | POA: Diagnosis not present

## 2017-04-29 DIAGNOSIS — F0391 Unspecified dementia with behavioral disturbance: Secondary | ICD-10-CM | POA: Diagnosis not present

## 2017-04-29 DIAGNOSIS — I2510 Atherosclerotic heart disease of native coronary artery without angina pectoris: Secondary | ICD-10-CM | POA: Diagnosis not present

## 2017-04-29 MED ORDER — NITROGLYCERIN 0.4 MG SL SUBL
0.4000 mg | SUBLINGUAL_TABLET | SUBLINGUAL | 3 refills | Status: AC | PRN
Start: 2017-04-29 — End: ?

## 2017-04-30 DIAGNOSIS — I5032 Chronic diastolic (congestive) heart failure: Secondary | ICD-10-CM | POA: Diagnosis not present

## 2017-04-30 DIAGNOSIS — J431 Panlobular emphysema: Secondary | ICD-10-CM | POA: Diagnosis not present

## 2017-04-30 DIAGNOSIS — F0391 Unspecified dementia with behavioral disturbance: Secondary | ICD-10-CM | POA: Diagnosis not present

## 2017-04-30 DIAGNOSIS — I482 Chronic atrial fibrillation: Secondary | ICD-10-CM | POA: Diagnosis not present

## 2017-04-30 DIAGNOSIS — I2510 Atherosclerotic heart disease of native coronary artery without angina pectoris: Secondary | ICD-10-CM | POA: Diagnosis not present

## 2017-04-30 DIAGNOSIS — J449 Chronic obstructive pulmonary disease, unspecified: Secondary | ICD-10-CM | POA: Diagnosis not present

## 2017-05-01 DIAGNOSIS — I482 Chronic atrial fibrillation: Secondary | ICD-10-CM | POA: Diagnosis not present

## 2017-05-01 DIAGNOSIS — I2510 Atherosclerotic heart disease of native coronary artery without angina pectoris: Secondary | ICD-10-CM | POA: Diagnosis not present

## 2017-05-01 DIAGNOSIS — J449 Chronic obstructive pulmonary disease, unspecified: Secondary | ICD-10-CM | POA: Diagnosis not present

## 2017-05-01 DIAGNOSIS — J431 Panlobular emphysema: Secondary | ICD-10-CM | POA: Diagnosis not present

## 2017-05-01 DIAGNOSIS — F0391 Unspecified dementia with behavioral disturbance: Secondary | ICD-10-CM | POA: Diagnosis not present

## 2017-05-01 DIAGNOSIS — I5032 Chronic diastolic (congestive) heart failure: Secondary | ICD-10-CM | POA: Diagnosis not present

## 2017-05-02 DIAGNOSIS — F0391 Unspecified dementia with behavioral disturbance: Secondary | ICD-10-CM | POA: Diagnosis not present

## 2017-05-02 DIAGNOSIS — J431 Panlobular emphysema: Secondary | ICD-10-CM | POA: Diagnosis not present

## 2017-05-02 DIAGNOSIS — I2510 Atherosclerotic heart disease of native coronary artery without angina pectoris: Secondary | ICD-10-CM | POA: Diagnosis not present

## 2017-05-02 DIAGNOSIS — J449 Chronic obstructive pulmonary disease, unspecified: Secondary | ICD-10-CM | POA: Diagnosis not present

## 2017-05-02 DIAGNOSIS — I5032 Chronic diastolic (congestive) heart failure: Secondary | ICD-10-CM | POA: Diagnosis not present

## 2017-05-02 DIAGNOSIS — I482 Chronic atrial fibrillation: Secondary | ICD-10-CM | POA: Diagnosis not present

## 2017-05-03 DIAGNOSIS — F0391 Unspecified dementia with behavioral disturbance: Secondary | ICD-10-CM | POA: Diagnosis not present

## 2017-05-03 DIAGNOSIS — I482 Chronic atrial fibrillation: Secondary | ICD-10-CM | POA: Diagnosis not present

## 2017-05-03 DIAGNOSIS — J449 Chronic obstructive pulmonary disease, unspecified: Secondary | ICD-10-CM | POA: Diagnosis not present

## 2017-05-03 DIAGNOSIS — I5032 Chronic diastolic (congestive) heart failure: Secondary | ICD-10-CM | POA: Diagnosis not present

## 2017-05-03 DIAGNOSIS — I2510 Atherosclerotic heart disease of native coronary artery without angina pectoris: Secondary | ICD-10-CM | POA: Diagnosis not present

## 2017-05-03 DIAGNOSIS — J431 Panlobular emphysema: Secondary | ICD-10-CM | POA: Diagnosis not present

## 2017-05-04 DIAGNOSIS — J449 Chronic obstructive pulmonary disease, unspecified: Secondary | ICD-10-CM | POA: Diagnosis not present

## 2017-05-04 DIAGNOSIS — I5032 Chronic diastolic (congestive) heart failure: Secondary | ICD-10-CM | POA: Diagnosis not present

## 2017-05-04 DIAGNOSIS — I482 Chronic atrial fibrillation: Secondary | ICD-10-CM | POA: Diagnosis not present

## 2017-05-04 DIAGNOSIS — F0391 Unspecified dementia with behavioral disturbance: Secondary | ICD-10-CM | POA: Diagnosis not present

## 2017-05-04 DIAGNOSIS — I2510 Atherosclerotic heart disease of native coronary artery without angina pectoris: Secondary | ICD-10-CM | POA: Diagnosis not present

## 2017-05-04 DIAGNOSIS — J431 Panlobular emphysema: Secondary | ICD-10-CM | POA: Diagnosis not present

## 2017-05-05 DIAGNOSIS — J449 Chronic obstructive pulmonary disease, unspecified: Secondary | ICD-10-CM | POA: Diagnosis not present

## 2017-05-05 DIAGNOSIS — I482 Chronic atrial fibrillation: Secondary | ICD-10-CM | POA: Diagnosis not present

## 2017-05-05 DIAGNOSIS — I5032 Chronic diastolic (congestive) heart failure: Secondary | ICD-10-CM | POA: Diagnosis not present

## 2017-05-05 DIAGNOSIS — I2510 Atherosclerotic heart disease of native coronary artery without angina pectoris: Secondary | ICD-10-CM | POA: Diagnosis not present

## 2017-05-05 DIAGNOSIS — F0391 Unspecified dementia with behavioral disturbance: Secondary | ICD-10-CM | POA: Diagnosis not present

## 2017-05-05 DIAGNOSIS — J431 Panlobular emphysema: Secondary | ICD-10-CM | POA: Diagnosis not present

## 2017-05-06 DIAGNOSIS — J431 Panlobular emphysema: Secondary | ICD-10-CM | POA: Diagnosis not present

## 2017-05-06 DIAGNOSIS — I2510 Atherosclerotic heart disease of native coronary artery without angina pectoris: Secondary | ICD-10-CM | POA: Diagnosis not present

## 2017-05-06 DIAGNOSIS — I5032 Chronic diastolic (congestive) heart failure: Secondary | ICD-10-CM | POA: Diagnosis not present

## 2017-05-06 DIAGNOSIS — I482 Chronic atrial fibrillation: Secondary | ICD-10-CM | POA: Diagnosis not present

## 2017-05-06 DIAGNOSIS — F0391 Unspecified dementia with behavioral disturbance: Secondary | ICD-10-CM | POA: Diagnosis not present

## 2017-05-06 DIAGNOSIS — J449 Chronic obstructive pulmonary disease, unspecified: Secondary | ICD-10-CM | POA: Diagnosis not present

## 2017-05-16 DEATH — deceased

## 2017-07-23 IMAGING — CR DG CHEST 1V PORT
1 series · 1 of 1 positions shown · non-contrast
Comparison: 10/24/2015

CLINICAL DATA: Shortness of breath, congestion, cough, and
wheezing.

EXAM:
PORTABLE CHEST 1 VIEW

[ap portable]
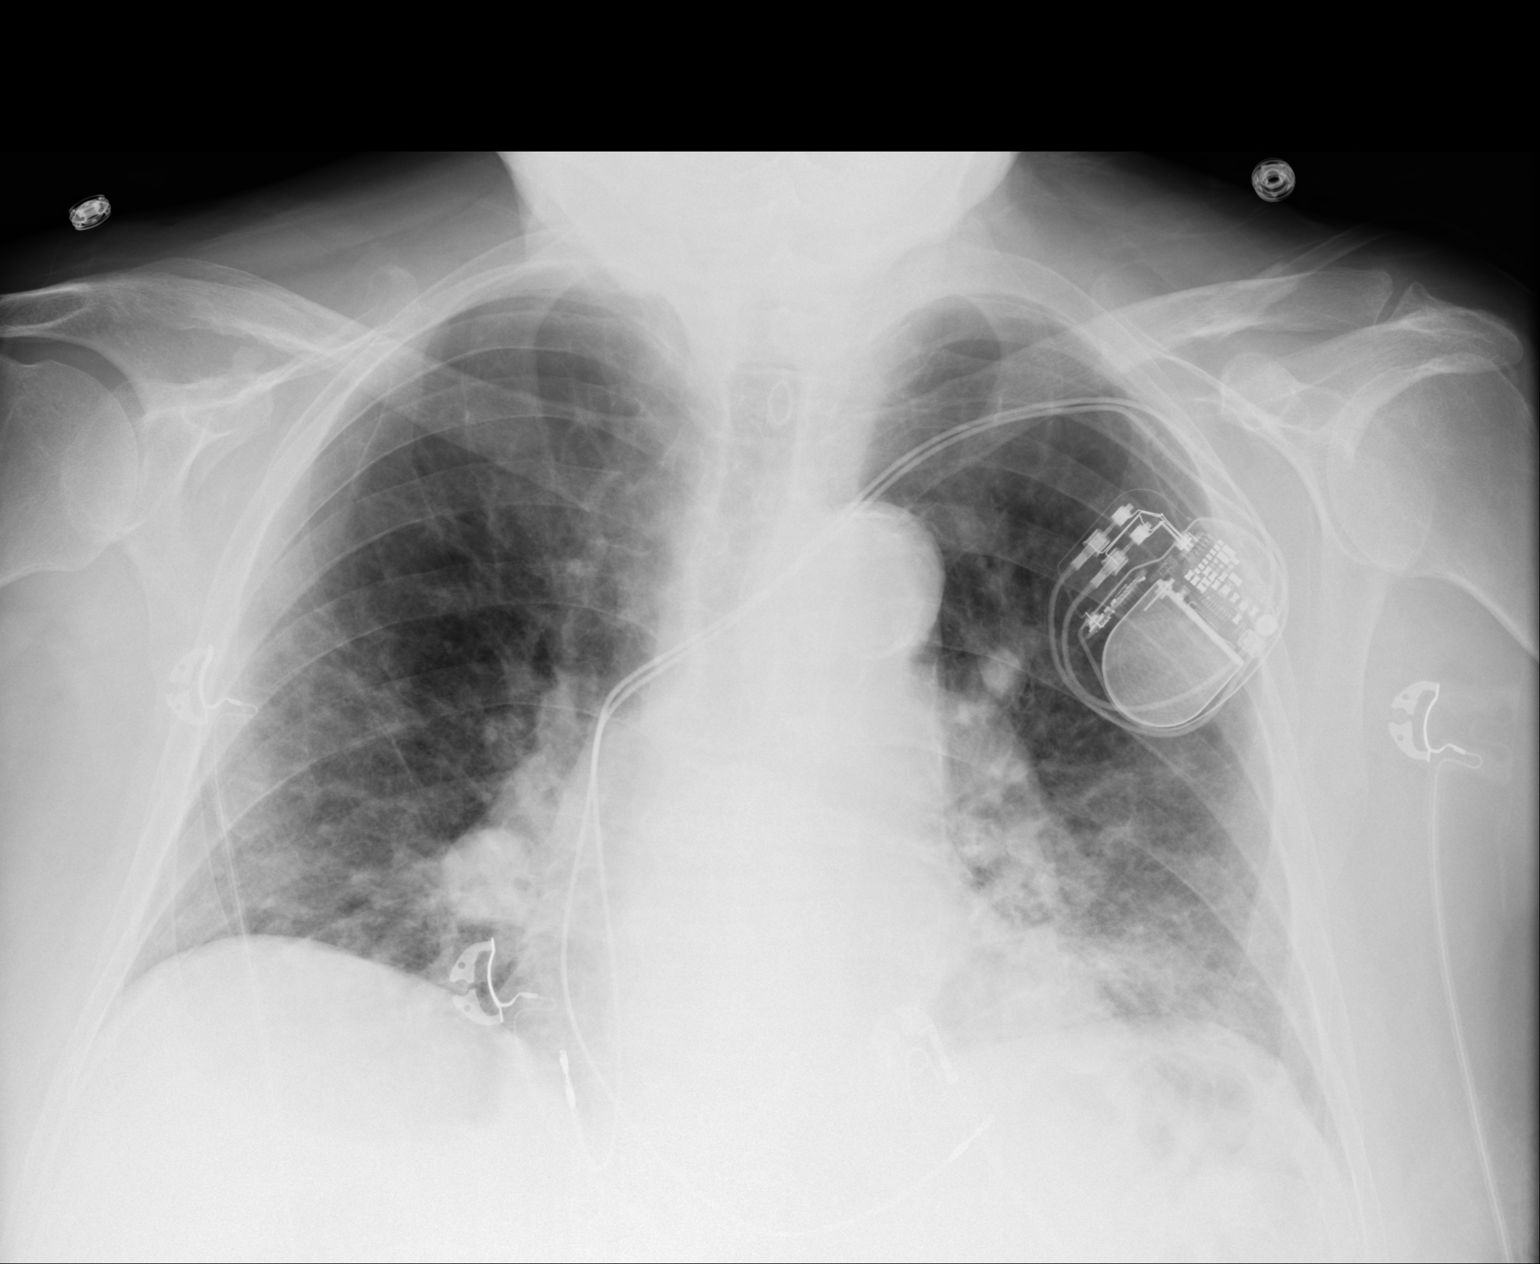

[1 of 1 positions shown; findings below may reference images not displayed]

FINDINGS: Cardiac pacemaker. Shallow inspiration with linear atelectasis in
the lung bases. There patchy airspace infiltrates demonstrated in
the mid and lower lung zones bilaterally, more prominent on the
left. These changes may indicate edema or pneumonia. Bilateral hilar
prominence is similar to previous study, probably representing
prominent vascularity. No blunting of costophrenic angles. No
pneumothorax. Cardiac enlargement without vascular congestion.
Calcified, tortuous, and ectatic aorta.
IMPRESSION: Bilateral mid and lower lung zone airspace infiltrates likely to
represent pneumonia or edema. Atelectasis in the lung bases. Cardiac
enlargement.

## 2017-08-26 ENCOUNTER — Ambulatory Visit (INDEPENDENT_AMBULATORY_CARE_PROVIDER_SITE_OTHER): Payer: Self-pay | Admitting: Internal Medicine
